# Patient Record
Sex: Female | Born: 1990 | Race: Black or African American | Hispanic: No | Marital: Single | Smoking: Former smoker
Health system: Southern US, Community
[De-identification: ages and names within clinical notes are randomized; demographics above are authoritative.]

## PROBLEM LIST (undated history)

## (undated) ENCOUNTER — Inpatient Hospital Stay (HOSPITAL_COMMUNITY): Payer: Self-pay

## (undated) DIAGNOSIS — F329 Major depressive disorder, single episode, unspecified: Secondary | ICD-10-CM

## (undated) DIAGNOSIS — N83209 Unspecified ovarian cyst, unspecified side: Secondary | ICD-10-CM

## (undated) DIAGNOSIS — I499 Cardiac arrhythmia, unspecified: Secondary | ICD-10-CM

## (undated) DIAGNOSIS — O039 Complete or unspecified spontaneous abortion without complication: Secondary | ICD-10-CM

## (undated) DIAGNOSIS — N390 Urinary tract infection, site not specified: Secondary | ICD-10-CM

## (undated) DIAGNOSIS — F319 Bipolar disorder, unspecified: Secondary | ICD-10-CM

## (undated) DIAGNOSIS — Z8619 Personal history of other infectious and parasitic diseases: Secondary | ICD-10-CM

## (undated) DIAGNOSIS — IMO0001 Reserved for inherently not codable concepts without codable children: Secondary | ICD-10-CM

## (undated) DIAGNOSIS — Q2112 Patent foramen ovale: Secondary | ICD-10-CM

## (undated) DIAGNOSIS — I712 Thoracic aortic aneurysm, without rupture, unspecified: Secondary | ICD-10-CM

## (undated) DIAGNOSIS — F32A Depression, unspecified: Secondary | ICD-10-CM

## (undated) DIAGNOSIS — Q211 Atrial septal defect: Secondary | ICD-10-CM

## (undated) HISTORY — DX: Personal history of other infectious and parasitic diseases: Z86.19

## (undated) HISTORY — PX: INDUCED ABORTION: SHX677

## (undated) HISTORY — DX: Unspecified ovarian cyst, unspecified side: N83.209

## (undated) HISTORY — PX: NO PAST SURGERIES: SHX2092

## (undated) HISTORY — PX: CARDIAC SURGERY: SHX584

---
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
## 2009-01-25 ENCOUNTER — Emergency Department (HOSPITAL_COMMUNITY): Admission: EM | Admit: 2009-01-25 | Discharge: 2009-01-25 | Payer: Self-pay | Admitting: Emergency Medicine

## 2009-05-27 ENCOUNTER — Emergency Department (HOSPITAL_COMMUNITY): Admission: EM | Admit: 2009-05-27 | Discharge: 2009-05-28 | Payer: Self-pay | Admitting: Emergency Medicine

## 2010-04-18 ENCOUNTER — Emergency Department (HOSPITAL_COMMUNITY): Admission: EM | Admit: 2010-04-18 | Discharge: 2010-04-18 | Payer: Self-pay | Admitting: Emergency Medicine

## 2010-11-06 LAB — URINALYSIS, ROUTINE W REFLEX MICROSCOPIC
Bilirubin Urine: NEGATIVE
Glucose, UA: NEGATIVE mg/dL
Nitrite: NEGATIVE
Protein, ur: NEGATIVE mg/dL
Specific Gravity, Urine: 1.029 (ref 1.005–1.030)
Urobilinogen, UA: 1 mg/dL (ref 0.0–1.0)
pH: 6 (ref 5.0–8.0)

## 2010-11-06 LAB — URINE MICROSCOPIC-ADD ON

## 2010-11-06 LAB — POCT PREGNANCY, URINE: Preg Test, Ur: NEGATIVE

## 2010-11-22 ENCOUNTER — Emergency Department (HOSPITAL_COMMUNITY)
Admission: EM | Admit: 2010-11-22 | Discharge: 2010-11-22 | Disposition: A | Discharge status: Home / Self Care | Payer: PPO | Attending: Emergency Medicine | Admitting: Emergency Medicine

## 2010-11-22 DIAGNOSIS — N390 Urinary tract infection, site not specified: Secondary | ICD-10-CM | POA: Insufficient documentation

## 2010-11-22 DIAGNOSIS — R300 Dysuria: Secondary | ICD-10-CM | POA: Insufficient documentation

## 2010-11-22 LAB — URINALYSIS, ROUTINE W REFLEX MICROSCOPIC
Bilirubin Urine: NEGATIVE
Glucose, UA: NEGATIVE mg/dL
Ketones, ur: NEGATIVE mg/dL
Leukocytes, UA: NEGATIVE
Nitrite: NEGATIVE
Protein, ur: NEGATIVE mg/dL
Specific Gravity, Urine: 1.024 (ref 1.005–1.030)
Urobilinogen, UA: 0.2 mg/dL (ref 0.0–1.0)
pH: 6.5 (ref 5.0–8.0)

## 2010-11-22 LAB — URINE MICROSCOPIC-ADD ON

## 2010-11-22 LAB — POCT PREGNANCY, URINE: Preg Test, Ur: NEGATIVE

## 2010-11-23 LAB — URINE CULTURE
Colony Count: 35000
Culture  Setup Time: 201204011147

## 2010-11-26 LAB — URINALYSIS, ROUTINE W REFLEX MICROSCOPIC
Bilirubin Urine: NEGATIVE
Glucose, UA: NEGATIVE mg/dL
Hgb urine dipstick: NEGATIVE
Nitrite: NEGATIVE
Protein, ur: NEGATIVE mg/dL
Specific Gravity, Urine: 1.027 (ref 1.005–1.030)
Urobilinogen, UA: 0.2 mg/dL (ref 0.0–1.0)
pH: 5.5 (ref 5.0–8.0)

## 2010-11-26 LAB — BASIC METABOLIC PANEL
BUN: 6 mg/dL (ref 6–23)
CO2: 23 mEq/L (ref 19–32)
Calcium: 8.9 mg/dL (ref 8.4–10.5)
Chloride: 104 mEq/L (ref 96–112)
Creatinine, Ser: 0.64 mg/dL (ref 0.4–1.2)
GFR calc Af Amer: >60 mL/min (ref >60)
GFR calc non Af Amer: >60 mL/min (ref >60)
Glucose, Bld: 92 mg/dL (ref 70–99)
Potassium: 3.3 mEq/L — ABNORMAL LOW (ref 3.5–5.1)
Sodium: 133 mEq/L — ABNORMAL LOW (ref 135–145)

## 2010-11-26 LAB — DIFFERENTIAL
Basophils Absolute: 0 10*3/uL (ref 0.0–0.1)
Basophils Relative: 0 % (ref 0–1)
Eosinophils Absolute: 0 10*3/uL (ref 0.0–0.7)
Eosinophils Relative: 0 % (ref 0–5)
Lymphocytes Relative: 26 % (ref 12–46)
Lymphs Abs: 1.7 10*3/uL (ref 0.7–4.0)
Monocytes Absolute: 0.4 10*3/uL (ref 0.1–1.0)
Monocytes Relative: 7 % (ref 3–12)
Neutro Abs: 4.3 10*3/uL (ref 1.7–7.7)
Neutrophils Relative %: 67 % (ref 43–77)

## 2010-11-26 LAB — URINE MICROSCOPIC-ADD ON

## 2010-11-26 LAB — POCT PREGNANCY, URINE: Preg Test, Ur: NEGATIVE

## 2010-11-26 LAB — CBC
HCT: 43.3 % (ref 36.0–46.0)
Hemoglobin: 14.6 g/dL (ref 12.0–15.0)
MCHC: 33.6 g/dL (ref 30.0–36.0)
MCV: 89.5 fL (ref 78.0–100.0)
Platelets: 262 10*3/uL (ref 150–400)
RBC: 4.84 MIL/uL (ref 3.87–5.11)
RDW: 14 % (ref 11.5–15.5)
WBC: 6.4 10*3/uL (ref 4.0–10.5)

## 2010-11-26 LAB — GC/CHLAMYDIA PROBE AMP, GENITAL
Chlamydia, DNA Probe: NEGATIVE
GC Probe Amp, Genital: NEGATIVE

## 2010-11-26 LAB — WET PREP, GENITAL
Clue Cells Wet Prep HPF POC: NONE SEEN
Trich, Wet Prep: NONE SEEN
WBC, Wet Prep HPF POC: NONE SEEN
Yeast Wet Prep HPF POC: NONE SEEN

## 2010-11-30 LAB — DIFFERENTIAL
Basophils Absolute: 0.1 10*3/uL (ref 0.0–0.1)
Basophils Relative: 1 % (ref 0–1)
Eosinophils Absolute: 0.1 10*3/uL (ref 0.0–0.7)
Eosinophils Relative: 1 % (ref 0–5)
Lymphocytes Relative: 43 % (ref 12–46)
Lymphs Abs: 3.1 10*3/uL (ref 0.7–4.0)
Monocytes Absolute: 0.6 10*3/uL (ref 0.1–1.0)
Monocytes Relative: 8 % (ref 3–12)
Neutro Abs: 3.4 10*3/uL (ref 1.7–7.7)
Neutrophils Relative %: 47 % (ref 43–77)

## 2010-11-30 LAB — BASIC METABOLIC PANEL
BUN: 10 mg/dL (ref 6–23)
CO2: 27 mEq/L (ref 19–32)
Calcium: 9.7 mg/dL (ref 8.4–10.5)
Chloride: 107 mEq/L (ref 96–112)
Creatinine, Ser: 0.63 mg/dL (ref 0.4–1.2)
GFR calc Af Amer: >60 mL/min (ref >60)
GFR calc non Af Amer: >60 mL/min (ref >60)
Glucose, Bld: 89 mg/dL (ref 70–99)
Potassium: 4.1 mEq/L (ref 3.5–5.1)
Sodium: 141 mEq/L (ref 135–145)

## 2010-11-30 LAB — POCT CARDIAC MARKERS
CKMB, poc: <1 ng/mL — ABNORMAL LOW (ref 1.0–8.0)
Myoglobin, poc: 45.5 ng/mL (ref 12–200)
Troponin i, poc: <0.05 ng/mL (ref 0.00–0.09)

## 2010-11-30 LAB — CBC
HCT: 41.9 % (ref 36.0–46.0)
Hemoglobin: 13.8 g/dL (ref 12.0–15.0)
MCHC: 33 g/dL (ref 30.0–36.0)
MCV: 88.8 fL (ref 78.0–100.0)
Platelets: 312 10*3/uL (ref 150–400)
RBC: 4.72 MIL/uL (ref 3.87–5.11)
RDW: 14.1 % (ref 11.5–15.5)
WBC: 7.1 10*3/uL (ref 4.0–10.5)

## 2011-01-08 ENCOUNTER — Emergency Department (HOSPITAL_COMMUNITY)
Admission: EM | Admit: 2011-01-08 | Discharge: 2011-01-08 | Disposition: A | Discharge status: Home / Self Care | Payer: PPO | Attending: Emergency Medicine | Admitting: Emergency Medicine

## 2011-01-08 ENCOUNTER — Emergency Department (HOSPITAL_COMMUNITY): Payer: PPO

## 2011-01-08 DIAGNOSIS — R000 Tachycardia, unspecified: Secondary | ICD-10-CM | POA: Insufficient documentation

## 2011-01-08 DIAGNOSIS — N83209 Unspecified ovarian cyst, unspecified side: Secondary | ICD-10-CM | POA: Insufficient documentation

## 2011-01-08 DIAGNOSIS — R109 Unspecified abdominal pain: Secondary | ICD-10-CM | POA: Insufficient documentation

## 2011-01-08 DIAGNOSIS — R509 Fever, unspecified: Secondary | ICD-10-CM | POA: Insufficient documentation

## 2011-01-08 DIAGNOSIS — R10815 Periumbilic abdominal tenderness: Secondary | ICD-10-CM | POA: Insufficient documentation

## 2011-01-08 LAB — CBC
HCT: 45.7 % (ref 36.0–46.0)
MCH: 29.8 pg (ref 26.0–34.0)
MCHC: 34.4 g/dL (ref 30.0–36.0)
MCV: 86.9 fL (ref 78.0–100.0)
Platelets: 249 10*3/uL (ref 150–400)
RDW: 13.2 % (ref 11.5–15.5)
WBC: 9.9 10*3/uL (ref 4.0–10.5)

## 2011-01-08 LAB — DIFFERENTIAL
Eosinophils Absolute: 0 10*3/uL (ref 0.0–0.7)
Eosinophils Relative: 0 % (ref 0–5)
Lymphocytes Relative: 23 % (ref 12–46)
Lymphs Abs: 2.3 10*3/uL (ref 0.7–4.0)
Monocytes Absolute: 0.9 10*3/uL (ref 0.1–1.0)

## 2011-01-08 LAB — URINALYSIS, ROUTINE W REFLEX MICROSCOPIC
Glucose, UA: NEGATIVE mg/dL
Ketones, ur: 15 mg/dL — AB
Nitrite: NEGATIVE
Specific Gravity, Urine: 1.025 (ref 1.005–1.030)
pH: 6 (ref 5.0–8.0)

## 2011-01-08 LAB — COMPREHENSIVE METABOLIC PANEL
Albumin: 4.6 g/dL (ref 3.5–5.2)
Alkaline Phosphatase: 62 U/L (ref 39–117)
BUN: 8 mg/dL (ref 6–23)
Calcium: 9.8 mg/dL (ref 8.4–10.5)
Creatinine, Ser: 0.58 mg/dL (ref 0.4–1.2)
Glucose, Bld: 84 mg/dL (ref 70–99)
Potassium: 3.6 mEq/L (ref 3.5–5.1)
Total Protein: 8.2 g/dL (ref 6.0–8.3)

## 2011-01-08 LAB — POCT PREGNANCY, URINE: Preg Test, Ur: NEGATIVE

## 2011-01-08 LAB — LIPASE, BLOOD: Lipase: 24 U/L (ref 11–59)

## 2011-01-08 LAB — OCCULT BLOOD, POC DEVICE: Fecal Occult Bld: NEGATIVE

## 2011-01-08 MED ORDER — IOHEXOL 300 MG/ML  SOLN
100.0000 mL | Freq: Once | INTRAMUSCULAR | Status: AC | PRN
  Administered 2011-01-08: 100 mL via INTRAVENOUS

## 2011-07-06 ENCOUNTER — Emergency Department (HOSPITAL_COMMUNITY)
Admission: EM | Admit: 2011-07-06 | Discharge: 2011-07-06 | Disposition: A | Discharge status: Home / Self Care | Payer: Self-pay | Attending: Emergency Medicine | Admitting: Emergency Medicine

## 2011-07-06 ENCOUNTER — Emergency Department (HOSPITAL_COMMUNITY): Payer: Self-pay

## 2011-07-06 ENCOUNTER — Encounter: Payer: Self-pay | Admitting: Emergency Medicine

## 2011-07-06 ENCOUNTER — Other Ambulatory Visit: Payer: Self-pay

## 2011-07-06 DIAGNOSIS — I499 Cardiac arrhythmia, unspecified: Secondary | ICD-10-CM

## 2011-07-06 DIAGNOSIS — R0602 Shortness of breath: Secondary | ICD-10-CM | POA: Insufficient documentation

## 2011-07-06 DIAGNOSIS — R079 Chest pain, unspecified: Secondary | ICD-10-CM | POA: Insufficient documentation

## 2011-07-06 DIAGNOSIS — J45990 Exercise induced bronchospasm: Secondary | ICD-10-CM | POA: Insufficient documentation

## 2011-07-06 HISTORY — DX: Cardiac arrhythmia, unspecified: I49.9

## 2011-07-06 MED ORDER — ALBUTEROL 90 MCG/ACT IN AERS: 2.0000 | INHALATION_SPRAY | Freq: Four times a day (QID) | RESPIRATORY_TRACT | Status: DC | PRN

## 2011-07-06 NOTE — ED Notes (Signed)
Pt returned from xray. NAD noted at this time. 

## 2011-07-06 NOTE — ED Provider Notes (Signed)
History     CSN: [id number] Arrival date & time: 07/06/2011 11:13 AM   First MD Initiated Contact with Patient 07/06/11 1147      Chief Complaint  Patient presents with  . Shortness of Breath    (Consider location/radiation/quality/duration/timing/severity/associated sxs/prior treatment) Patient is a 20 y.o. female presenting with shortness of breath. The history is provided by the patient.  Shortness of Breath  The current episode started 3 to 5 days ago. The onset was sudden. The problem occurs occasionally. The problem has been unchanged. The symptoms are relieved by rest. The symptoms are aggravated by activity. Associated symptoms include chest pain and shortness of breath. Pertinent negatives include no chest pressure, no orthopnea, no fever, no rhinorrhea, no sore throat, no stridor, no cough and no wheezing. Her past medical history does not include asthma, bronchiolitis, past wheezing or asthma in the family. She has been behaving normally. Urine output has been normal. There were no sick contacts.    Past Medical History  Diagnosis Date  . Irregular heart beat 07/06/11    History reviewed. No pertinent past surgical history.  No family history on file.  History  Substance Use Topics  . Smoking status: Not on file  . Smokeless tobacco: Not on file  . Alcohol Use: No    OB History    Grav Para Term Preterm Abortions TAB SAB Ect Mult Living                  Review of Systems  Constitutional: Negative for fever, chills, diaphoresis, activity change, appetite change, fatigue and unexpected weight change.  HENT: Negative for sore throat and rhinorrhea.   Respiratory: Positive for shortness of breath. Negative for apnea, cough, choking, chest tightness, wheezing and stridor.   Cardiovascular: Positive for chest pain. Negative for palpitations, orthopnea and leg swelling.  Gastrointestinal: Negative for nausea, vomiting, abdominal pain, diarrhea, constipation and  abdominal distention.  Musculoskeletal: Negative.   Skin: Negative.   Neurological: Negative.   Hematological: Negative.   Psychiatric/Behavioral: Negative.   All other systems reviewed and are negative.    Allergies  Food  Home Medications   Current Outpatient Rx  Name Route Sig Dispense Refill  . ASPIRIN BUFFERED 325 MG PO TABS Oral Take 650 mg by mouth daily.        BP 111/78  Pulse 72  Temp(Src) 99 F (37.2 C) (Oral)  Resp 18  Wt 162 lb (73.483 kg)  SpO2 100%  LMP 06/17/2011  Physical Exam  Constitutional: She is oriented to person, place, and time. She appears well-developed and well-nourished. No distress.  HENT:  Head: Normocephalic and atraumatic.  Neck: Normal range of motion. Neck supple.  Cardiovascular: Normal rate, regular rhythm and intact distal pulses.        No murmur heard.  Pulmonary/Chest: Effort normal and breath sounds normal. No respiratory distress.  Abdominal: Soft. Bowel sounds are normal. She exhibits no distension. There is no tenderness. There is no rebound.  Musculoskeletal: Normal range of motion.  Neurological: She is alert and oriented to person, place, and time. No cranial nerve deficit.  Skin: She is not diaphoretic.    ED Course  Procedures (including critical care time)  Labs Reviewed - No data to display Dg Chest 2 View  07/06/2011  *RADIOLOGY REPORT*  Clinical Data: Left-sided chest pain and shortness of breath  CHEST - 2 VIEW  Comparison: 01/25/2009  Findings: The heart and pulmonary vascularity are within normal limits.  The lungs  are free of acute infiltrate or sizable effusion.  No acute bony abnormality is noted.  IMPRESSION: No acute intrathoracic abnormality.  No change from prior exam.  Original Report Authenticated By: Phillips Odor, M.D.     1. Exercise-induced asthma       MDM  Pt seen with new onset SOB with exercise. PERC negative for PE. No recent travel or risk factors. Possibly due to exercise induced  asthma. Will give albuterol inhaler prescription and check EKG for chest pain. Would recommend that pt follow up with cardiologist as previously recommended on work up of chest pain.    1:17 PM EKG examined with attending physician and deemed normal. Discussed with pt the results of her EKG and CXR and talked to her about exercise induced asthma and will give Rx for albuterol inhaler and instructed her on use and using 10 minutes prior to activity to see if her symptoms are alleviated. Also informed if symptoms worsen or do not improve she should call a clinic for appointment or return to the ED. She states that she is going to see the doctor that her grandmother sees and I reinforced that it would be a good idea to have a doctor.      Genella Mech, MD 07/06/11 1352

## 2011-07-06 NOTE — ED Notes (Signed)
States that she has CP a lot and usually when she comes she is told that she has palpitations. States that she has been diagnosed with a heart mummur

## 2011-07-08 NOTE — ED Provider Notes (Signed)
Medical screening examination/treatment/procedure(s) were performed by non-physician practitioner and as supervising physician I was immediately available for consultation/collaboration.  Toy Baker, MD 07/08/11 [phone]

## 2011-10-22 ENCOUNTER — Other Ambulatory Visit: Payer: Self-pay

## 2011-10-22 ENCOUNTER — Encounter (HOSPITAL_COMMUNITY): Payer: Self-pay | Admitting: *Deleted

## 2011-10-22 ENCOUNTER — Emergency Department (HOSPITAL_COMMUNITY): Payer: Self-pay

## 2011-10-22 ENCOUNTER — Emergency Department (HOSPITAL_COMMUNITY)
Admission: EM | Admit: 2011-10-22 | Discharge: 2011-10-22 | Disposition: A | Discharge status: Home / Self Care | Payer: Self-pay | Attending: Emergency Medicine | Admitting: Emergency Medicine

## 2011-10-22 DIAGNOSIS — Z7982 Long term (current) use of aspirin: Secondary | ICD-10-CM | POA: Insufficient documentation

## 2011-10-22 DIAGNOSIS — F17200 Nicotine dependence, unspecified, uncomplicated: Secondary | ICD-10-CM | POA: Insufficient documentation

## 2011-10-22 DIAGNOSIS — R209 Unspecified disturbances of skin sensation: Secondary | ICD-10-CM | POA: Insufficient documentation

## 2011-10-22 DIAGNOSIS — R071 Chest pain on breathing: Secondary | ICD-10-CM | POA: Insufficient documentation

## 2011-10-22 DIAGNOSIS — R0789 Other chest pain: Secondary | ICD-10-CM | POA: Insufficient documentation

## 2011-10-22 MED ORDER — NAPROXEN 500 MG PO TABS: 500.0000 mg | ORAL_TABLET | Freq: Two times a day (BID) | ORAL | Status: DC

## 2011-10-22 MED ORDER — CYCLOBENZAPRINE HCL 10 MG PO TABS: 10.0000 mg | ORAL_TABLET | Freq: Two times a day (BID) | ORAL | Status: AC | PRN

## 2011-10-22 MED ORDER — IBUPROFEN 800 MG PO TABS
800.0000 mg | ORAL_TABLET | Freq: Once | ORAL | Status: DC
  Filled 2011-10-22: qty 1

## 2011-10-22 NOTE — ED Provider Notes (Signed)
History     CSN: [id number]  Arrival date & time 10/22/11  1559   First MD Initiated Contact with Patient 10/22/11 1713      Chief Complaint  Patient presents with  . Chest Pain    (Consider location/radiation/quality/duration/timing/severity/associated sxs/prior treatment) HPI Comments: Patient has had right-sided chest pain for the past several days it radiates into her right arm and right upper back. Today she developed tingling and numbness in her right arm. No weakness, cough, fever, shortness of breath. The pain is reproducible to palpation on her chest wall. No rashes. She's not on birth control. Taking aspirin at home. She denies any injury to her arm. 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  The history is provided by the patient.    Past Medical History  Diagnosis Date  . Irregular heart beat 07/06/11    History reviewed. No pertinent past surgical history.  History reviewed. No pertinent family history.  History  Substance Use Topics  . Smoking status: Current Everyday Smoker  . Smokeless tobacco: Not on file  . Alcohol Use: No    OB History    Grav Para Term Preterm Abortions TAB SAB Ect Mult Living                  Review of Systems  Constitutional: Negative for fever, activity change and appetite change.  HENT: Negative for congestion and rhinorrhea.   Respiratory: Positive for chest tightness. Negative for cough and shortness of breath.   Cardiovascular: Positive for chest pain.  Gastrointestinal: Negative for nausea, vomiting and abdominal pain.  Genitourinary: Negative for dysuria and hematuria.  Musculoskeletal: Positive for arthralgias. Negative for back pain.  Skin: Negative for rash.  Neurological: Negative for headaches.    Allergies  Food  Home Medications   Current Outpatient Rx  Name Route Sig Dispense Refill  . ASPIRIN 81 MG PO CHEW Oral Chew 81 mg by mouth daily.    . ALBUTEROL 90 MCG/ACT IN AERS Inhalation Inhale 2 puffs  into the lungs every 6 (six) hours as needed for wheezing. 17 g 0  . CYCLOBENZAPRINE HCL 10 MG PO TABS Oral Take 1 tablet (10 mg total) by mouth 2 (two) times daily as needed for muscle spasms. 20 tablet 0  . NAPROXEN 500 MG PO TABS Oral Take 1 tablet (500 mg total) by mouth 2 (two) times daily. 30 tablet 0    BP 116/79  Pulse 90  Temp(Src) 99.1 F (37.3 C) (Oral)  Resp 20  SpO2 100%  LMP 10/17/2011  Physical Exam  Constitutional: She appears well-developed and well-nourished. No distress.  HENT:  Head: Normocephalic and atraumatic.  Mouth/Throat: Oropharynx is clear and moist. No oropharyngeal exudate.  Eyes: Conjunctivae are normal. Pupils are equal, round, and reactive to light.  Neck: Normal range of motion. Neck supple.       No paraspinal muscle tenderness  Cardiovascular: Normal rate, regular rhythm and normal heart sounds.   Pulmonary/Chest: Effort normal and breath sounds normal. No respiratory distress. She exhibits tenderness.       Reproducible right-sided chest wall pain, no rash  Abdominal: Soft. There is no tenderness. There is no rebound and no guarding.  Musculoskeletal: Normal range of motion. She exhibits no edema and no tenderness.  Neurological: She is alert. No cranial nerve deficit.       5 out of 5 strength throughout, equal grip strengths bilatereally.  Cardinal hand movements intact.  Capillary refill <2 sec.  Skin:  Skin is warm.    ED Course  Procedures (including critical care time)  Labs Reviewed - No data to display Dg Chest 2 View  10/22/2011  *RADIOLOGY REPORT*  Clinical Data: Right-sided chest pain and numbness  CHEST - 2 VIEW  Comparison: 07/06/2011  Findings: The heart size and mediastinal contours are within normal limits.  Both lungs are clear.  The visualized skeletal structures are unremarkable.  IMPRESSION: Negative exam.  Original Report Authenticated By: Rosealee Albee, M.D.     1. Chest wall pain       MDM  Reproducible  right-sided chest wall pain. Causing tingling and numbness in right arm. Grip strength equal. Perfusion to right upper extremity are intact with good muscle strength, sensation and pulses. PERC neg.   Date: 10/22/2011  Rate: 83  Rhythm: normal sinus rhythm  QRS Axis: normal  Intervals: normal  ST/T Wave abnormalities: normal  Conduction Disutrbances:none  Narrative Interpretation:   Old EKG Reviewed: unchanged         Glynn Octave, MD 10/22/11 2132

## 2011-10-22 NOTE — ED Notes (Signed)
Pt reports several day hx of right sided chest pain radiating into right arm. States her arm feels like it is asleep. Denies sob or nausea.

## 2012-02-04 ENCOUNTER — Encounter (HOSPITAL_COMMUNITY): Payer: Self-pay

## 2012-02-04 ENCOUNTER — Emergency Department (HOSPITAL_COMMUNITY)
Admission: EM | Admit: 2012-02-04 | Discharge: 2012-02-04 | Disposition: A | Discharge status: Home / Self Care | Payer: BC Managed Care – PPO | Attending: Emergency Medicine | Admitting: Emergency Medicine

## 2012-02-04 DIAGNOSIS — F17200 Nicotine dependence, unspecified, uncomplicated: Secondary | ICD-10-CM | POA: Insufficient documentation

## 2012-02-04 DIAGNOSIS — N390 Urinary tract infection, site not specified: Secondary | ICD-10-CM

## 2012-02-04 DIAGNOSIS — R300 Dysuria: Secondary | ICD-10-CM | POA: Insufficient documentation

## 2012-02-04 DIAGNOSIS — R350 Frequency of micturition: Secondary | ICD-10-CM | POA: Insufficient documentation

## 2012-02-04 DIAGNOSIS — Z7982 Long term (current) use of aspirin: Secondary | ICD-10-CM | POA: Insufficient documentation

## 2012-02-04 DIAGNOSIS — I499 Cardiac arrhythmia, unspecified: Secondary | ICD-10-CM | POA: Insufficient documentation

## 2012-02-04 DIAGNOSIS — Z87440 Personal history of urinary (tract) infections: Secondary | ICD-10-CM | POA: Insufficient documentation

## 2012-02-04 LAB — URINALYSIS, ROUTINE W REFLEX MICROSCOPIC
Hgb urine dipstick: NEGATIVE
Nitrite: POSITIVE — AB
Protein, ur: 30 mg/dL — AB
Specific Gravity, Urine: 1.028 (ref 1.005–1.030)
Urobilinogen, UA: 4 mg/dL — ABNORMAL HIGH (ref 0.0–1.0)

## 2012-02-04 LAB — URINE MICROSCOPIC-ADD ON

## 2012-02-04 LAB — POCT PREGNANCY, URINE: Preg Test, Ur: NEGATIVE

## 2012-02-04 MED ORDER — NITROFURANTOIN MONOHYD MACRO 100 MG PO CAPS
100.0000 mg | ORAL_CAPSULE | Freq: Once | ORAL | Status: AC
  Administered 2012-02-04: 100 mg via ORAL
  Filled 2012-02-04: qty 1

## 2012-02-04 MED ORDER — NITROFURANTOIN MONOHYD MACRO 100 MG PO CAPS: 100.0000 mg | ORAL_CAPSULE | Freq: Two times a day (BID) | ORAL | Status: AC

## 2012-02-04 NOTE — ED Notes (Signed)
Pt with hx of multiple UTIs, states she is having urgency and frequency urinating. Pt states she usually is placed on antibiotics twice a year on average for UTIs.

## 2012-02-04 NOTE — ED Provider Notes (Signed)
History     CSN: [id number]  Arrival date & time 02/04/12  4098   First MD Initiated Contact with Patient 02/04/12 [phone]      Chief Complaint  Patient presents with  . Urinary Tract Infection    (Consider location/radiation/quality/duration/timing/severity/associated sxs/prior treatment) HPI This is a 21 year old black female with a history of twice yearly urinary tract infections. She is here with urinary urgency, frequency, small voids and burning with urination that started yesterday. The symptoms are moderate. She is having low back pain. She's not having abdominal pain. She is not having flank pain. She has not had fever or chills. She has not had nausea or vomiting. She is not on her menses. She has been taking over-the-counter phenazopyridine with slight relief.  Past Medical History  Diagnosis Date  . Irregular heart beat 07/06/11    No past surgical history on file.  History reviewed. No pertinent family history.  History  Substance Use Topics  . Smoking status: Current Everyday Smoker -- 0.5 packs/day  . Smokeless tobacco: Not on file  . Alcohol Use: No    OB History    Grav Para Term Preterm Abortions TAB SAB Ect Mult Living                  Review of Systems  All other systems reviewed and are negative.    Allergies  Food  Home Medications   Current Outpatient Rx  Name Route Sig Dispense Refill  . ALBUTEROL 90 MCG/ACT IN AERS Inhalation Inhale 2 puffs into the lungs every 6 (six) hours as needed for wheezing. 17 g 0  . ASPIRIN 81 MG PO CHEW Oral Chew 81 mg by mouth daily.    Marland Kitchen NAPROXEN 500 MG PO TABS Oral Take 1 tablet (500 mg total) by mouth 2 (two) times daily. 30 tablet 0    BP 120/69  Pulse 96  Temp 98.5 F (36.9 C) (Oral)  Resp 18  Ht 5\' 8"  (1.727 m)  Wt 165 lb (74.844 kg)  BMI 25.09 kg/m2  SpO2 99%  LMP 01/17/2012  Physical Exam General: Well-developed, well-nourished female in no acute distress; appearance consistent with age of  record HENT: normocephalic, atraumatic Eyes: pupils equal round and reactive to light; extraocular muscles intact Neck: supple Heart: regular rate and rhythm Lungs: clear to auscultation bilaterally Abdomen: soft; nondistended; nontender; bowel sounds present GU: No flank tenderness Extremities: No deformity; full range of motion Neurologic: Awake, alert and oriented; motor function intact in all extremities and symmetric; no facial droop Skin: Warm and dry Psychiatric: Normal mood and affect    ED Course  Procedures (including critical care time)     MDM   Nursing notes and vitals signs, including pulse oximetry, reviewed.  Summary of this visit's results, reviewed by myself:  Labs:  Results for orders placed during the hospital encounter of 02/04/12  URINALYSIS, ROUTINE W REFLEX MICROSCOPIC      Component Value Range   Color, Urine RED (*) YELLOW   APPearance CLOUDY (*) CLEAR   Specific Gravity, Urine 1.028  1.005 - 1.030   pH 5.0  5.0 - 8.0   Glucose, UA NEGATIVE  NEGATIVE mg/dL   Hgb urine dipstick NEGATIVE  NEGATIVE   Bilirubin Urine MODERATE (*) NEGATIVE   Ketones, ur 40 (*) NEGATIVE mg/dL   Protein, ur 30 (*) NEGATIVE mg/dL   Urobilinogen, UA 4.0 (*) 0.0 - 1.0 mg/dL   Nitrite POSITIVE (*) NEGATIVE   Leukocytes, UA MODERATE (*) NEGATIVE  POCT PREGNANCY, URINE      Component Value Range   Preg Test, Ur NEGATIVE  NEGATIVE  URINE MICROSCOPIC-ADD ON      Component Value Range   Squamous Epithelial / LPF FEW (*) RARE   WBC, UA 3-6  <3 WBC/hpf   RBC / HPF 0-2  <3 RBC/hpf   Bacteria, UA RARE  RARE   We'll treat based on symptomatology. Will send urine culture.        Hanley Seamen, MD 02/04/12 [phone]

## 2012-02-04 NOTE — Discharge Instructions (Signed)

## 2012-02-05 LAB — URINE CULTURE
Colony Count: NO GROWTH
Culture  Setup Time: 201306140833
Culture: NO GROWTH

## 2012-02-08 ENCOUNTER — Encounter (HOSPITAL_COMMUNITY): Payer: Self-pay | Admitting: *Deleted

## 2012-02-08 ENCOUNTER — Emergency Department (HOSPITAL_COMMUNITY)
Admission: EM | Admit: 2012-02-08 | Discharge: 2012-02-09 | Disposition: A | Discharge status: Home / Self Care | Payer: BC Managed Care – PPO | Attending: Emergency Medicine | Admitting: Emergency Medicine

## 2012-02-08 DIAGNOSIS — Z7982 Long term (current) use of aspirin: Secondary | ICD-10-CM | POA: Insufficient documentation

## 2012-02-08 DIAGNOSIS — Z87440 Personal history of urinary (tract) infections: Secondary | ICD-10-CM | POA: Insufficient documentation

## 2012-02-08 DIAGNOSIS — M545 Low back pain, unspecified: Secondary | ICD-10-CM | POA: Insufficient documentation

## 2012-02-08 DIAGNOSIS — N390 Urinary tract infection, site not specified: Secondary | ICD-10-CM

## 2012-02-08 DIAGNOSIS — Z79899 Other long term (current) drug therapy: Secondary | ICD-10-CM | POA: Insufficient documentation

## 2012-02-08 DIAGNOSIS — R3915 Urgency of urination: Secondary | ICD-10-CM | POA: Insufficient documentation

## 2012-02-08 DIAGNOSIS — R300 Dysuria: Secondary | ICD-10-CM | POA: Insufficient documentation

## 2012-02-08 HISTORY — DX: Urinary tract infection, site not specified: N39.0

## 2012-02-08 LAB — URINALYSIS, ROUTINE W REFLEX MICROSCOPIC
Glucose, UA: NEGATIVE mg/dL
Hgb urine dipstick: NEGATIVE
Ketones, ur: NEGATIVE mg/dL
Protein, ur: NEGATIVE mg/dL
Urobilinogen, UA: 0.2 mg/dL (ref 0.0–1.0)

## 2012-02-08 LAB — URINE MICROSCOPIC-ADD ON

## 2012-02-08 NOTE — ED Notes (Signed)
Pt reports she was seen here on 02/04/12 for dysuria, pt was dx w/ uti and given prescriptions for antibiotics. Pt states she has hx of UTI and usually starts feeling better 2 days after starting antibiotics - pt states she is now feeling worse and is having increased pain w/ urination, lower back pain and rt flank pain. Pt denies any fever or n/v.

## 2012-02-09 MED ORDER — CIPROFLOXACIN HCL 500 MG PO TABS
500.0000 mg | ORAL_TABLET | Freq: Once | ORAL | Status: AC
  Administered 2012-02-09: 500 mg via ORAL
  Filled 2012-02-09: qty 1

## 2012-02-09 MED ORDER — CIPROFLOXACIN HCL 500 MG PO TABS: 500.0000 mg | ORAL_TABLET | Freq: Two times a day (BID) | ORAL | Status: AC

## 2012-02-09 NOTE — ED Provider Notes (Signed)
History     CSN: [id number]  Arrival date & time 02/08/12  1956   First MD Initiated Contact with Patient 02/08/12 2351      Chief Complaint  Patient presents with  . Dysuria    (Consider location/radiation/quality/duration/timing/severity/associated sxs/prior treatment) HPI This is a 21 year old black female who was seen by myself on June 14 for dysuria. She has a history of multiple UTIs in the past and was treated with Macrobid. She returns complaining of worsening symptoms. Specifically moderate to severe burning on urination, urgency and low back pain. She's had chills but no fever. She denies nausea vomiting. Urine culture obtained on June 14 showed no growth.   Past Medical History  Diagnosis Date  . Irregular heart beat 07/06/11  . UTI (lower urinary tract infection)     History reviewed. No pertinent past surgical history.  History reviewed. No pertinent family history.  History  Substance Use Topics  . Smoking status: Current Everyday Smoker -- 0.5 packs/day  . Smokeless tobacco: Not on file  . Alcohol Use: No    OB History    Grav Para Term Preterm Abortions TAB SAB Ect Mult Living                  Review of Systems  All other systems reviewed and are negative.    Allergies  Food  Home Medications   Current Outpatient Rx  Name Route Sig Dispense Refill  . ALBUTEROL 90 MCG/ACT IN AERS Inhalation Inhale 2 puffs into the lungs every 6 (six) hours as needed for wheezing. 17 g 0  . ASPIRIN 81 MG PO CHEW Oral Chew 81 mg by mouth daily.    Marland Kitchen NITROFURANTOIN MONOHYD MACRO 100 MG PO CAPS Oral Take 1 capsule (100 mg total) by mouth 2 (two) times daily. 14 capsule 0    BP 111/68  Pulse 86  Temp 98.4 F (36.9 C) (Oral)  Resp 16  SpO2 100%  LMP 01/17/2012  Physical Exam General: Well-developed, well-nourished female in no acute distress; appearance consistent with age of record HENT: normocephalic, atraumatic Eyes: pupils equal round and reactive to  light; extraocular muscles intact Neck: supple Heart: regular rate and rhythm Lungs: clear to auscultation bilaterally Abdomen: soft; nondistended; nontender; no masses or hepatosplenomegaly; bowel sounds present Back: Bilateral paralumbar tenderness GU: no flank tenderness Extremities: No deformity; full range of motion Neurologic: Awake, alert and oriented; motor function intact in all extremities and symmetric; no facial droop Skin: Warm and dry Psychiatric: Normal mood and affect    ED Course  Procedures (including critical care time)     MDM   Nursing notes and vitals signs, including pulse oximetry, reviewed.  Summary of this visit's results, reviewed by myself:  Labs:  Results for orders placed during the hospital encounter of 02/08/12  URINALYSIS, ROUTINE W REFLEX MICROSCOPIC      Component Value Range   Color, Urine YELLOW  YELLOW   APPearance CLOUDY (*) CLEAR   Specific Gravity, Urine 1.018  1.005 - 1.030   pH 6.5  5.0 - 8.0   Glucose, UA NEGATIVE  NEGATIVE mg/dL   Hgb urine dipstick NEGATIVE  NEGATIVE   Bilirubin Urine NEGATIVE  NEGATIVE   Ketones, ur NEGATIVE  NEGATIVE mg/dL   Protein, ur NEGATIVE  NEGATIVE mg/dL   Urobilinogen, UA 0.2  0.0 - 1.0 mg/dL   Nitrite NEGATIVE  NEGATIVE   Leukocytes, UA MODERATE (*) NEGATIVE  URINE MICROSCOPIC-ADD ON      Component Value  Range   Squamous Epithelial / LPF FEW (*) RARE   WBC, UA 11-20  <3 WBC/hpf   RBC / HPF 0-2  <3 RBC/hpf   Bacteria, UA FEW (*) RARE   We'll switch to ciprofloxacin and reculture urine.           Hanley Seamen, MD 02/09/12 [phone]

## 2012-02-09 NOTE — Discharge Instructions (Signed)

## 2012-02-10 LAB — URINE CULTURE
Colony Count: NO GROWTH
Culture  Setup Time: 201306190522

## 2012-06-15 ENCOUNTER — Emergency Department (HOSPITAL_COMMUNITY): Payer: Worker's Compensation

## 2012-06-15 ENCOUNTER — Emergency Department (HOSPITAL_COMMUNITY)
Admission: EM | Admit: 2012-06-15 | Discharge: 2012-06-15 | Disposition: A | Discharge status: Home / Self Care | Payer: Worker's Compensation | Attending: Emergency Medicine | Admitting: Emergency Medicine

## 2012-06-15 ENCOUNTER — Encounter (HOSPITAL_COMMUNITY): Payer: Self-pay | Admitting: Emergency Medicine

## 2012-06-15 DIAGNOSIS — Z79899 Other long term (current) drug therapy: Secondary | ICD-10-CM | POA: Insufficient documentation

## 2012-06-15 DIAGNOSIS — Y9301 Activity, walking, marching and hiking: Secondary | ICD-10-CM | POA: Insufficient documentation

## 2012-06-15 DIAGNOSIS — Y929 Unspecified place or not applicable: Secondary | ICD-10-CM | POA: Insufficient documentation

## 2012-06-15 DIAGNOSIS — F17200 Nicotine dependence, unspecified, uncomplicated: Secondary | ICD-10-CM | POA: Insufficient documentation

## 2012-06-15 DIAGNOSIS — W108XXA Fall (on) (from) other stairs and steps, initial encounter: Secondary | ICD-10-CM | POA: Insufficient documentation

## 2012-06-15 DIAGNOSIS — Z87448 Personal history of other diseases of urinary system: Secondary | ICD-10-CM | POA: Insufficient documentation

## 2012-06-15 DIAGNOSIS — S93402A Sprain of unspecified ligament of left ankle, initial encounter: Secondary | ICD-10-CM

## 2012-06-15 DIAGNOSIS — W19XXXA Unspecified fall, initial encounter: Secondary | ICD-10-CM

## 2012-06-15 DIAGNOSIS — S20229A Contusion of unspecified back wall of thorax, initial encounter: Secondary | ICD-10-CM | POA: Insufficient documentation

## 2012-06-15 DIAGNOSIS — S93409A Sprain of unspecified ligament of unspecified ankle, initial encounter: Secondary | ICD-10-CM | POA: Insufficient documentation

## 2012-06-15 DIAGNOSIS — Z8679 Personal history of other diseases of the circulatory system: Secondary | ICD-10-CM | POA: Insufficient documentation

## 2012-06-15 MED ORDER — NAPROXEN 500 MG PO TABS: 500.0000 mg | ORAL_TABLET | Freq: Two times a day (BID) | ORAL | Status: DC

## 2012-06-15 NOTE — ED Notes (Signed)
Waiting on Ortho Tech to apply an ASO ankle brace

## 2012-06-15 NOTE — ED Notes (Signed)
Pt was walking on stairs and turned her left ankle and fell to her left side. Pt now c/o left ankle pain, worse with movement, and left side pain.

## 2012-06-15 NOTE — ED Provider Notes (Signed)
History     CSN: [id number]  Arrival date & time 06/15/12  1407   First MD Initiated Contact with Patient 06/15/12 1504      Chief Complaint  Patient presents with  . Ankle Pain    (Consider location/radiation/quality/duration/timing/severity/associated sxs/prior treatment) HPI Comments: Veronica Knight is a 21 y.o. Female who presents with complaint of a fall. Pt states she was walking down steps, states her foot turned, twisting ankle, and she fell backwards and hit lower back on a step. States now pain in left ankle and lower back. Pt states she is able to ambulate with pain. Did not take any medications. Denies weakness or numbness in her legs. No urinary or fecal incontinence/retention. No hematuria or abdominal pain.     Past Medical History  Diagnosis Date  . Irregular heart beat 07/06/11  . UTI (lower urinary tract infection)     History reviewed. No pertinent past surgical history.  History reviewed. No pertinent family history.  History  Substance Use Topics  . Smoking status: Current Every Day Smoker -- 0.5 packs/day  . Smokeless tobacco: Not on file  . Alcohol Use: No    OB History    Grav Para Term Preterm Abortions TAB SAB Ect Mult Living                  Review of Systems  Constitutional: Negative for fever and chills.  HENT: Negative for neck pain and neck stiffness.   Respiratory: Negative.   Cardiovascular: Negative.   Genitourinary: Negative for dysuria and hematuria.  Musculoskeletal: Positive for back pain and joint swelling.  Neurological: Negative for dizziness, weakness and headaches.    Allergies  Cherry  Home Medications   Current Outpatient Rx  Name Route Sig Dispense Refill  . ALBUTEROL 90 MCG/ACT IN AERS Inhalation Inhale 2 puffs into the lungs every 6 (six) hours as needed for wheezing. 17 g 0    BP 118/77  Pulse 83  Temp 98.5 F (36.9 C) (Oral)  Resp 18  SpO2 100%  LMP 06/14/2012  Physical Exam  Nursing note  and vitals reviewed. Constitutional: She appears well-developed and well-nourished. No distress.  HENT:  Head: Normocephalic.  Eyes: Conjunctivae normal are normal.  Neck: Neck supple.  Cardiovascular: Normal rate, regular rhythm and normal heart sounds.   Pulmonary/Chest: Effort normal and breath sounds normal. She has no wheezes. She has no rales.  Musculoskeletal:       Normal appearing left ankle. Tender over the lateral malleolus. Pain with ankle plantarflexion, dorsiflexion, inversion. Tender over lumbar midline spine. No bruising, swelling, deformity. Tender over left SI joint. No pain with left straight leg raise.   Neurological:       5/5 and equal LE strength. 2+ and equal patellar reflexes bilaterally  Skin: Skin is warm and dry.  Psychiatric: She has a normal mood and affect.    ED Course  Procedures (including critical care time)  Labs Reviewed - No data to display Dg Ankle Complete Left  06/15/2012  *RADIOLOGY REPORT*  Clinical Data: Trauma 2 hours ago.  Lateral pain.  LEFT ANKLE COMPLETE - 3+ VIEW  Comparison: None.  Findings: No acute fracture or dislocation.  Base of fifth metatarsal and talar dome intact.  No significant soft tissue swelling.  IMPRESSION: No acute osseous abnormality.   Original Report Authenticated By: Consuello Bossier, M.D.        1. Left ankle sprain   2. Back contusion   3. Fall  MDM  X-rays negative. No neuro deficits. She is in no distress. Ambulatory. D/c home with anti inflammatories, follow up as needed.         Lottie Mussel, PA 06/15/12 [phone]

## 2012-06-20 NOTE — ED Provider Notes (Signed)
Medical screening examination/treatment/procedure(s) were performed by non-physician practitioner and as supervising physician I was immediately available for consultation/collaboration.  Raeford Razor, MD 06/20/12 2308

## 2012-10-30 ENCOUNTER — Ambulatory Visit: Payer: Self-pay | Admitting: Obstetrics and Gynecology

## 2013-05-04 ENCOUNTER — Encounter (HOSPITAL_COMMUNITY): Payer: Self-pay

## 2013-05-04 ENCOUNTER — Emergency Department (HOSPITAL_COMMUNITY)
Admission: EM | Admit: 2013-05-04 | Discharge: 2013-05-05 | Disposition: A | Discharge status: Home / Self Care | Payer: BC Managed Care – PPO | Attending: Emergency Medicine | Admitting: Emergency Medicine

## 2013-05-04 DIAGNOSIS — Z87440 Personal history of urinary (tract) infections: Secondary | ICD-10-CM | POA: Insufficient documentation

## 2013-05-04 DIAGNOSIS — Z8679 Personal history of other diseases of the circulatory system: Secondary | ICD-10-CM | POA: Insufficient documentation

## 2013-05-04 DIAGNOSIS — Z3202 Encounter for pregnancy test, result negative: Secondary | ICD-10-CM | POA: Insufficient documentation

## 2013-05-04 DIAGNOSIS — N766 Ulceration of vulva: Secondary | ICD-10-CM | POA: Insufficient documentation

## 2013-05-04 DIAGNOSIS — B9689 Other specified bacterial agents as the cause of diseases classified elsewhere: Secondary | ICD-10-CM

## 2013-05-04 DIAGNOSIS — N760 Acute vaginitis: Secondary | ICD-10-CM | POA: Insufficient documentation

## 2013-05-04 DIAGNOSIS — F17200 Nicotine dependence, unspecified, uncomplicated: Secondary | ICD-10-CM | POA: Insufficient documentation

## 2013-05-04 LAB — URINALYSIS, ROUTINE W REFLEX MICROSCOPIC
Nitrite: NEGATIVE
Protein, ur: NEGATIVE mg/dL
Specific Gravity, Urine: 1.033 — ABNORMAL HIGH (ref 1.005–1.030)
Urobilinogen, UA: 1 mg/dL (ref 0.0–1.0)

## 2013-05-04 LAB — POCT PREGNANCY, URINE: Preg Test, Ur: NEGATIVE

## 2013-05-04 NOTE — ED Notes (Signed)
Pt has hx of UTI.  Pt states has had same symptoms.  Pt has vaginal irritation.  Pt states no specific urinary difficulty but has had UTI in past without urinary difficulty.  No fever.  Slight abdominal pain.

## 2013-05-05 LAB — WET PREP, GENITAL

## 2013-05-05 MED ORDER — METRONIDAZOLE 500 MG PO TABS: 500.0000 mg | ORAL_TABLET | Freq: Two times a day (BID) | ORAL | Status: DC

## 2013-05-05 NOTE — ED Provider Notes (Signed)
Medical screening examination/treatment/procedure(s) were performed by non-physician practitioner and as supervising physician I was immediately available for consultation/collaboration.   Kingsley Farace, MD 05/05/13 0714 

## 2013-05-05 NOTE — ED Notes (Signed)
Patient is alert and oriented x3.  She was given DC instructions and follow up visit instructions.  Patient gave verbal understanding. She was DC ambulatory under her own power to home.  V/S stable.  He was not showing any signs of distress on DC 

## 2013-05-05 NOTE — ED Notes (Signed)
Patient refused lab draw. RN made aware. 

## 2013-05-05 NOTE — ED Notes (Signed)
Dammen PA made aware that patient refused lab work.

## 2013-05-05 NOTE — ED Provider Notes (Signed)
CSN: [id number]     Arrival date & time 05/04/13  2221 History   First MD Initiated Contact with Patient 05/05/13 0121     Chief Complaint  Patient presents with  . Vaginal Discharge   HPI   history provided by the patient. The patient is a 22 year old female who presents with complaints of vaginal irritation and discharge. Patient reports having a burning pain with this or to the skin for the past 2 days. She's also noticed slight vaginal discharge with a foul shoulder. Patient states she has not been essentially active since April. She denies having similar symptoms previously. Pain is worse with palpation or wiping with tissue. She denies any trauma or history of injuries. There has not been any bleeding. No visual changes. She is some irritation with urinating. Denies any urinary frequency or hematuria. No abdominal pain or flank pain. No fever, chills or sweats.    Past Medical History  Diagnosis Date  . Irregular heart beat 07/06/11  . UTI (lower urinary tract infection)    History reviewed. No pertinent past surgical history. History reviewed. No pertinent family history. History  Substance Use Topics  . Smoking status: Current Every Day Smoker -- 0.50 packs/day  . Smokeless tobacco: Not on file  . Alcohol Use: No   OB History   Grav Para Term Preterm Abortions TAB SAB Ect Mult Living                 Review of Systems  Constitutional: Negative for fever, chills and diaphoresis.  Gastrointestinal: Negative for nausea, vomiting, abdominal pain and diarrhea.  Genitourinary: Positive for dysuria and vaginal discharge. Negative for frequency, hematuria, flank pain and vaginal bleeding.  All other systems reviewed and are negative.    Allergies  Cherry  Home Medications   Current Outpatient Rx  Name  Route  Sig  Dispense  Refill  . Cranberry 200 MG CAPS   Oral   Take 2 capsules by mouth every morning.          BP 107/70  Pulse 84  Temp(Src) 98.6 F (37 C)  (Oral)  Resp 18  SpO2 100%  LMP 04/03/2013 Physical Exam  Nursing note and vitals reviewed. Constitutional: She is oriented to person, place, and time. She appears well-developed and well-nourished. No distress.  HENT:  Head: Normocephalic.  Cardiovascular: Normal rate and regular rhythm.   Pulmonary/Chest: Effort normal and breath sounds normal. No respiratory distress. She has no wheezes. She has no rales.  Abdominal: Soft. She exhibits no distension. There is no tenderness. There is no rebound and no guarding.  No CVA tenderness  Genitourinary:  Chaperone was present. There is a small ulceration versus seizure type lesion to the medial aspect of the lower left labia minora. No bleeding. Area is tender to touch. There is a small amount of cervical discharge. No adnexal tenderness or fullness. No masses. No CMT.  Neurological: She is alert and oriented to person, place, and time.  Skin: Skin is warm and dry. No rash noted.  Psychiatric: She has a normal mood and affect. Her behavior is normal.    ED Course  Procedures   Results for orders placed during the hospital encounter of 05/04/13  WET PREP, GENITAL      Result Value Range   Yeast Wet Prep HPF POC NONE SEEN  NONE SEEN   Trich, Wet Prep NONE SEEN  NONE SEEN   Clue Cells Wet Prep HPF POC FEW (*) NONE SEEN  WBC, Wet Prep HPF POC FEW (*) NONE SEEN  URINALYSIS, ROUTINE W REFLEX MICROSCOPIC      Result Value Range   Color, Urine YELLOW  YELLOW   APPearance CLOUDY (*) CLEAR   Specific Gravity, Urine 1.033 (*) 1.005 - 1.030   pH 5.5  5.0 - 8.0   Glucose, UA NEGATIVE  NEGATIVE mg/dL   Hgb urine dipstick NEGATIVE  NEGATIVE   Bilirubin Urine NEGATIVE  NEGATIVE   Ketones, ur NEGATIVE  NEGATIVE mg/dL   Protein, ur NEGATIVE  NEGATIVE mg/dL   Urobilinogen, UA 1.0  0.0 - 1.0 mg/dL   Nitrite NEGATIVE  NEGATIVE   Leukocytes, UA NEGATIVE  NEGATIVE  POCT PREGNANCY, URINE      Result Value Range   Preg Test, Ur NEGATIVE  NEGATIVE         MDM   1. Genital labial ulcer   2. BV (bacterial vaginosis)      Patient seen and evaluated. She is well appearing in no acute distress. UA unremarkable without signs of UTI.  Patient states she has not essentially active for the past several months. She has not had any other penetration or explanation for an injury. Prior history of herpes. There has been no vesicles or other lesions of the skin. At this time we'll plan to have patient to monitor and follow up temperature health department or [facility] for further evaluation and treatment.     Angus Seller, PA-C 05/05/13 [phone]

## 2013-05-06 LAB — GC/CHLAMYDIA PROBE AMP: CT Probe RNA: NEGATIVE

## 2013-08-13 ENCOUNTER — Emergency Department (HOSPITAL_COMMUNITY)
Admission: EM | Admit: 2013-08-13 | Discharge: 2013-08-13 | Disposition: A | Discharge status: Home / Self Care | Payer: BC Managed Care – PPO | Attending: Emergency Medicine | Admitting: Emergency Medicine

## 2013-08-13 ENCOUNTER — Encounter (HOSPITAL_COMMUNITY): Payer: Self-pay | Admitting: Emergency Medicine

## 2013-08-13 DIAGNOSIS — Z87440 Personal history of urinary (tract) infections: Secondary | ICD-10-CM | POA: Insufficient documentation

## 2013-08-13 DIAGNOSIS — S61451A Open bite of right hand, initial encounter: Secondary | ICD-10-CM

## 2013-08-13 DIAGNOSIS — Z23 Encounter for immunization: Secondary | ICD-10-CM | POA: Insufficient documentation

## 2013-08-13 DIAGNOSIS — S61409A Unspecified open wound of unspecified hand, initial encounter: Secondary | ICD-10-CM | POA: Insufficient documentation

## 2013-08-13 DIAGNOSIS — F17200 Nicotine dependence, unspecified, uncomplicated: Secondary | ICD-10-CM | POA: Insufficient documentation

## 2013-08-13 DIAGNOSIS — IMO0002 Reserved for concepts with insufficient information to code with codable children: Secondary | ICD-10-CM | POA: Insufficient documentation

## 2013-08-13 DIAGNOSIS — Z8679 Personal history of other diseases of the circulatory system: Secondary | ICD-10-CM | POA: Insufficient documentation

## 2013-08-13 MED ORDER — TETANUS-DIPHTHERIA TOXOIDS TD 5-2 LFU IM INJ
0.5000 mL | INJECTION | Freq: Once | INTRAMUSCULAR | Status: AC
  Administered 2013-08-13: 0.5 mL via INTRAMUSCULAR
  Filled 2013-08-13: qty 0.5

## 2013-08-13 MED ORDER — AMOXICILLIN-POT CLAVULANATE 875-125 MG PO TABS: 1.0000 | ORAL_TABLET | Freq: Two times a day (BID) | ORAL | Status: DC
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
## 2013-08-13 NOTE — ED Provider Notes (Signed)
Medical screening examination/treatment/procedure(s) were performed by non-physician practitioner and as supervising physician I was immediately available for consultation/collaboration.  EKG Interpretation   None         Joby Hershkowitz M Tanner Yeley, MD 08/13/13 2356 
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
## 2013-10-12 ENCOUNTER — Emergency Department (HOSPITAL_COMMUNITY)
Admission: EM | Admit: 2013-10-12 | Discharge: 2013-10-13 | Disposition: A | Discharge status: Home / Self Care | Payer: Medicaid Other | Attending: Emergency Medicine | Admitting: Emergency Medicine

## 2013-10-12 ENCOUNTER — Encounter (HOSPITAL_COMMUNITY): Payer: Self-pay | Admitting: Emergency Medicine

## 2013-10-12 DIAGNOSIS — O34599 Maternal care for other abnormalities of gravid uterus, unspecified trimester: Secondary | ICD-10-CM | POA: Insufficient documentation

## 2013-10-12 DIAGNOSIS — Z79899 Other long term (current) drug therapy: Secondary | ICD-10-CM | POA: Insufficient documentation

## 2013-10-12 DIAGNOSIS — O219 Vomiting of pregnancy, unspecified: Secondary | ICD-10-CM

## 2013-10-12 DIAGNOSIS — Z3490 Encounter for supervision of normal pregnancy, unspecified, unspecified trimester: Secondary | ICD-10-CM

## 2013-10-12 DIAGNOSIS — Q2111 Secundum atrial septal defect: Secondary | ICD-10-CM | POA: Insufficient documentation

## 2013-10-12 DIAGNOSIS — N83209 Unspecified ovarian cyst, unspecified side: Secondary | ICD-10-CM

## 2013-10-12 DIAGNOSIS — N898 Other specified noninflammatory disorders of vagina: Secondary | ICD-10-CM | POA: Insufficient documentation

## 2013-10-12 DIAGNOSIS — Z87440 Personal history of urinary (tract) infections: Secondary | ICD-10-CM | POA: Insufficient documentation

## 2013-10-12 DIAGNOSIS — O9989 Other specified diseases and conditions complicating pregnancy, childbirth and the puerperium: Secondary | ICD-10-CM | POA: Insufficient documentation

## 2013-10-12 DIAGNOSIS — Q211 Atrial septal defect: Secondary | ICD-10-CM | POA: Insufficient documentation

## 2013-10-12 DIAGNOSIS — R011 Cardiac murmur, unspecified: Secondary | ICD-10-CM | POA: Insufficient documentation

## 2013-10-12 DIAGNOSIS — O210 Mild hyperemesis gravidarum: Secondary | ICD-10-CM | POA: Insufficient documentation

## 2013-10-12 DIAGNOSIS — O99330 Smoking (tobacco) complicating pregnancy, unspecified trimester: Secondary | ICD-10-CM | POA: Insufficient documentation

## 2013-10-12 HISTORY — DX: Patent foramen ovale: Q21.12

## 2013-10-12 HISTORY — DX: Atrial septal defect: Q21.1

## 2013-10-12 NOTE — ED Notes (Signed)
Pt reports intermittent L LQ abdominal pain x 3 days with n/v, and a white vaginal discharge. Pt denies any other GI or GU complaints.

## 2013-10-13 ENCOUNTER — Emergency Department (HOSPITAL_COMMUNITY): Payer: Medicaid Other

## 2013-10-13 LAB — BASIC METABOLIC PANEL
BUN: 8 mg/dL (ref 6–23)
CALCIUM: 10.1 mg/dL (ref 8.4–10.5)
CHLORIDE: 93 meq/L — AB (ref 96–112)
CO2: 24 mEq/L (ref 19–32)
CREATININE: 0.61 mg/dL (ref 0.50–1.10)
Glucose, Bld: 82 mg/dL (ref 70–99)
Potassium: 3.7 mEq/L (ref 3.7–5.3)
Sodium: 134 mEq/L — ABNORMAL LOW (ref 137–147)

## 2013-10-13 LAB — URINE MICROSCOPIC-ADD ON

## 2013-10-13 LAB — CBC WITH DIFFERENTIAL/PLATELET
BASOS ABS: 0 10*3/uL (ref 0.0–0.1)
BASOS PCT: 0 % (ref 0–1)
EOS ABS: 0.1 10*3/uL (ref 0.0–0.7)
EOS PCT: 1 % (ref 0–5)
HCT: 44.6 % (ref 36.0–46.0)
Hemoglobin: 16 g/dL — ABNORMAL HIGH (ref 12.0–15.0)
Lymphocytes Relative: 35 % (ref 12–46)
Lymphs Abs: 2.8 10*3/uL (ref 0.7–4.0)
MCH: 31.6 pg (ref 26.0–34.0)
MCHC: 35.9 g/dL (ref 30.0–36.0)
MCV: 88.1 fL (ref 78.0–100.0)
MONO ABS: 1.1 10*3/uL — AB (ref 0.1–1.0)
MONOS PCT: 13 % — AB (ref 3–12)
NEUTROS ABS: 4 10*3/uL (ref 1.7–7.7)
Neutrophils Relative %: 51 % (ref 43–77)
Platelets: 296 10*3/uL (ref 150–400)
RBC: 5.06 MIL/uL (ref 3.87–5.11)
RDW: 13.9 % (ref 11.5–15.5)
WBC: 7.8 10*3/uL (ref 4.0–10.5)

## 2013-10-13 LAB — HIV ANTIBODY (ROUTINE TESTING W REFLEX): HIV: NONREACTIVE

## 2013-10-13 LAB — URINALYSIS, ROUTINE W REFLEX MICROSCOPIC
BILIRUBIN URINE: NEGATIVE
Glucose, UA: NEGATIVE mg/dL
HGB URINE DIPSTICK: NEGATIVE
Nitrite: NEGATIVE
Protein, ur: 30 mg/dL — AB
SPECIFIC GRAVITY, URINE: 1.03 (ref 1.005–1.030)
UROBILINOGEN UA: 0.2 mg/dL (ref 0.0–1.0)
pH: 6 (ref 5.0–8.0)

## 2013-10-13 LAB — RPR: RPR Ser Ql: NONREACTIVE

## 2013-10-13 LAB — HCG, QUANTITATIVE, PREGNANCY: HCG, BETA CHAIN, QUANT, S: 22800 m[IU]/mL — AB (ref <5)

## 2013-10-13 LAB — WET PREP, GENITAL
CLUE CELLS WET PREP: NONE SEEN
TRICH WET PREP: NONE SEEN
WBC WET PREP: NONE SEEN
YEAST WET PREP: NONE SEEN

## 2013-10-13 LAB — PREGNANCY, URINE: Preg Test, Ur: POSITIVE — AB

## 2013-10-13 LAB — ABO/RH: ABO/RH(D): O POS

## 2013-10-13 MED ORDER — PRENATAL COMPLETE 14-0.4 MG PO TABS: 1.0000 | ORAL_TABLET | Freq: Every day | ORAL | Status: DC

## 2013-10-13 MED ORDER — ONDANSETRON 8 MG PO TBDP: ORAL_TABLET | ORAL | Status: DC

## 2013-10-13 NOTE — ED Notes (Signed)
PA at bedside.

## 2013-10-13 NOTE — ED Provider Notes (Signed)
CSN: [id number]     Arrival date & time 10/12/13  2336 History   First MD Initiated Contact with Patient 10/13/13 0226     Chief Complaint  Patient presents with  . Abdominal Pain  . Vaginal Discharge   HPI  History provided by the patient. Patient is a 23 year old female who presents with complaints of white vaginal discharge and left pelvic and abdominal pain and discomfort. Patient states she first began having left abdominal and side pain for the past 3 days. This was associated with some thick white vaginal discharge which she thought could possibly be a yeast infection. Today her symptoms were associated with several episodes of nausea vomiting anytime she tried to eat. Patient denies having similar symptoms previously. Denies any associated diarrhea symptoms. No fever, chills or sweats. Her last normal menstrual cycle was January 10. She did report having a slight small amount of spotting earlier this month which was abnormal for her. She reports usually having 5 days of bleeding and is very irregular with a 28 day cycle. She is sexually active occasionally using condoms. No prior history of STDs. She has only one partner. Denies any other aggravating or alleviating factors. No other associated symptoms.    Past Medical History  Diagnosis Date  . Irregular heart beat 07/06/11  . UTI (lower urinary tract infection)   . Heart murmur   . PFO (patent foramen ovale)    History reviewed. No pertinent past surgical history. History reviewed. No pertinent family history. History  Substance Use Topics  . Smoking status: Current Every Day Smoker -- 0.25 packs/day    Types: Cigarettes  . Smokeless tobacco: Never Used  . Alcohol Use: Yes     Comment: occ.   OB History   Grav Para Term Preterm Abortions TAB SAB Ect Mult Living                 Review of Systems  Constitutional: Negative for fever and diaphoresis.  Gastrointestinal: Positive for nausea, vomiting and abdominal pain.  Negative for diarrhea and constipation.  Genitourinary: Positive for vaginal discharge. Negative for dysuria, frequency, hematuria, flank pain, vaginal bleeding and menstrual problem.  All other systems reviewed and are negative.      Allergies  Cherry  Home Medications   Current Outpatient Rx  Name  Route  Sig  Dispense  Refill  . albuterol (PROVENTIL HFA;VENTOLIN HFA) 108 (90 BASE) MCG/ACT inhaler   Inhalation   Inhale 2 puffs into the lungs every 6 (six) hours as needed for wheezing or shortness of breath.          BP 129/83  Pulse 72  Temp(Src) 98.6 F (37 C) (Oral)  Resp 16  Ht 5\' 8"  (1.727 m)  Wt 147 lb (66.679 kg)  BMI 22.36 kg/m2  SpO2 100%  LMP 09/03/2013 Physical Exam  Nursing note and vitals reviewed. Constitutional: She is oriented to person, place, and time. She appears well-developed and well-nourished. No distress.  HENT:  Head: Normocephalic.  Cardiovascular: Normal rate and regular rhythm.   Pulmonary/Chest: Effort normal and breath sounds normal. No respiratory distress. She has no wheezes. She has no rales.  Abdominal: Soft. There is tenderness in the suprapubic area. There is no rigidity, no rebound, no guarding, no CVA tenderness, no tenderness at McBurney's point and negative Murphy's sign.  Genitourinary:  Chaperone was present. Large amounts of thick white vaginal discharge. No blood. No products of conception. Cervix closed. No adnexal tenderness or fullness.  Musculoskeletal:  Normal range of motion.  Neurological: She is alert and oriented to person, place, and time.  Skin: Skin is warm and dry. No rash noted.  Psychiatric: She has a normal mood and affect. Her behavior is normal.    ED Course  Procedures  DIAGNOSTIC STUDIES: Oxygen Saturation is 100% on room air.    COORDINATION OF CARE:  Nursing notes reviewed. Vital signs reviewed. Initial pt interview and examination performed.   2:30 AM patient seen and evaluated. Patient  appears well in no acute distress. Does not appear in severe pain or discomfort. Discussed work up plan with pt at bedside, which includes lab testing an ultrasound test. Pt agrees with plan.  Patient tolerating by mouth fluids. I have discussed laboratory findings and ultrasound findings. No signs for ectopic pregnancy. Patient does have left ovarian cyst. Discussed the need for OB/GYN followup. She agrees with this plan.   Results for orders placed during the hospital encounter of 10/12/13  WET PREP, GENITAL      Result Value Ref Range   Yeast Wet Prep HPF POC NONE SEEN  NONE SEEN   Trich, Wet Prep NONE SEEN  NONE SEEN   Clue Cells Wet Prep HPF POC NONE SEEN  NONE SEEN   WBC, Wet Prep HPF POC NONE SEEN  NONE SEEN  PREGNANCY, URINE      Result Value Ref Range   Preg Test, Ur POSITIVE (*) NEGATIVE  URINALYSIS, ROUTINE W REFLEX MICROSCOPIC      Result Value Ref Range   Color, Urine AMBER (*) YELLOW   APPearance CLOUDY (*) CLEAR   Specific Gravity, Urine 1.030  1.005 - 1.030   pH 6.0  5.0 - 8.0   Glucose, UA NEGATIVE  NEGATIVE mg/dL   Hgb urine dipstick NEGATIVE  NEGATIVE   Bilirubin Urine NEGATIVE  NEGATIVE   Ketones, ur >80 (*) NEGATIVE mg/dL   Protein, ur 30 (*) NEGATIVE mg/dL   Urobilinogen, UA 0.2  0.0 - 1.0 mg/dL   Nitrite NEGATIVE  NEGATIVE   Leukocytes, UA TRACE (*) NEGATIVE  URINE MICROSCOPIC-ADD ON      Result Value Ref Range   Squamous Epithelial / LPF MANY (*) RARE   WBC, UA 3-6  <3 WBC/hpf   Bacteria, UA RARE  RARE   Urine-Other MUCOUS PRESENT    CBC WITH DIFFERENTIAL      Result Value Ref Range   WBC 7.8  4.0 - 10.5 K/uL   RBC 5.06  3.87 - 5.11 MIL/uL   Hemoglobin 16.0 (*) 12.0 - 15.0 g/dL   HCT 08.644.6  57.836.0 - 46.946.0 %   MCV 88.1  78.0 - 100.0 fL   MCH 31.6  26.0 - 34.0 pg   MCHC 35.9  30.0 - 36.0 g/dL   RDW 62.913.9  52.811.5 - 41.315.5 %   Platelets 296  150 - 400 K/uL   Neutrophils Relative % 51  43 - 77 %   Neutro Abs 4.0  1.7 - 7.7 K/uL   Lymphocytes Relative 35  12 -  46 %   Lymphs Abs 2.8  0.7 - 4.0 K/uL   Monocytes Relative 13 (*) 3 - 12 %   Monocytes Absolute 1.1 (*) 0.1 - 1.0 K/uL   Eosinophils Relative 1  0 - 5 %   Eosinophils Absolute 0.1  0.0 - 0.7 K/uL   Basophils Relative 0  0 - 1 %   Basophils Absolute 0.0  0.0 - 0.1 K/uL  BASIC METABOLIC PANEL  Result Value Ref Range   Sodium 134 (*) 137 - 147 mEq/L   Potassium 3.7  3.7 - 5.3 mEq/L   Chloride 93 (*) 96 - 112 mEq/L   CO2 24  19 - 32 mEq/L   Glucose, Bld 82  70 - 99 mg/dL   BUN 8  6 - 23 mg/dL   Creatinine, Ser 1.61  0.50 - 1.10 mg/dL   Calcium 09.6  8.4 - 04.5 mg/dL   GFR calc non Af Amer >90  >90 mL/min   GFR calc Af Amer >90  >90 mL/min  HCG, QUANTITATIVE, PREGNANCY      Result Value Ref Range   hCG, Beta Chain, Quant, S 22800 (*) <5 mIU/mL  ABO/RH      Result Value Ref Range   ABO/RH(D) O POS        Imaging Review US Ob Comp Less 14 Wks  10/13/2013   CLINICAL DATA:  Pelvic pain.  EXAM: OBSTETRIC <14 WK [place] AND TRANSVAGINAL OB US  TECHNIQUE: Both transabdominal and transvaginal ultrasound examinations were performed for complete evaluation of the gestation as well as the maternal uterus, adnexal regions, and pelvic cul-de-sac. Transvaginal technique was performed to assess early pregnancy.  COMPARISON:  CT of the abdomen and pelvis performed 01/08/2011  FINDINGS: Intrauterine gestational sac: Visualized/normal in shape.  Yolk sac:  Yes  Embryo:  Yes  Cardiac Activity: Yes  Heart Rate:  Remains too small to fully characterize.  CRL:   2.1  mm   5 w 5d                  [place] EDC: 06/10/2014  Maternal uterus/adnexae: No subchorionic hemorrhage is noted. The uterus is unremarkable in appearance.  The right ovary is not visualized. The left ovary measures 4.2 x 3.1 x 3.2 cm. A mildly complex left ovarian cyst, measuring 2.6 cm in size, may reflect an organizing hemorrhagic cyst, given its lace-like pattern.  No free fluid is seen within the pelvic cul-de-sac.  IMPRESSION: 1. Single live  intrauterine pregnancy noted, with a crown-rump length of 2 mm, corresponding to a gestational age of [redacted] weeks 5 days. This matches the gestational age by LMP, reflecting an estimated date of delivery of June 10, 2014. 2. 2.6 cm complex left ovarian cyst may reflect an organizing hemorrhagic cyst.   Electronically Signed   By: Roanna Raider M.D.   On: 10/13/2013 03:59   US Ob Transvaginal  10/13/2013   CLINICAL DATA:  Pelvic pain.  EXAM: OBSTETRIC <14 WK [place] AND TRANSVAGINAL OB US  TECHNIQUE: Both transabdominal and transvaginal ultrasound examinations were performed for complete evaluation of the gestation as well as the maternal uterus, adnexal regions, and pelvic cul-de-sac. Transvaginal technique was performed to assess early pregnancy.  COMPARISON:  CT of the abdomen and pelvis performed 01/08/2011  FINDINGS: Intrauterine gestational sac: Visualized/normal in shape.  Yolk sac:  Yes  Embryo:  Yes  Cardiac Activity: Yes  Heart Rate:  Remains too small to fully characterize.  CRL:   2.1  mm   5 w 5d                  [place] EDC: 06/10/2014  Maternal uterus/adnexae: No subchorionic hemorrhage is noted. The uterus is unremarkable in appearance.  The right ovary is not visualized. The left ovary measures 4.2 x 3.1 x 3.2 cm. A mildly complex left ovarian cyst, measuring 2.6 cm in size, may reflect an organizing hemorrhagic cyst, given its lace-like  pattern.  No free fluid is seen within the pelvic cul-de-sac.  IMPRESSION: 1. Single live intrauterine pregnancy noted, with a crown-rump length of 2 mm, corresponding to a gestational age of [redacted] weeks 5 days. This matches the gestational age by LMP, reflecting an estimated date of delivery of June 10, 2014. 2. 2.6 cm complex left ovarian cyst may reflect an organizing hemorrhagic cyst.   Electronically Signed   By: Roanna Raider M.D.   On: 10/13/2013 03:59    EKG Interpretation   None       MDM   Final diagnoses:  Pregnancy  Ovarian cyst  Nausea/vomiting in  pregnancy        Angus Seller, PA-C 10/13/13 [phone]

## 2013-10-13 NOTE — Discharge Instructions (Signed)
You were found to have a positive pregnancy test today. Your testing confirmed an intrauterine pregnancy estimated to be 5 weeks 5 days. Your ultrasound test also showed that you have a 2.6 cm left ovarian cyst which may be causing some of your discomforts. Please followup with an OB/GYN specialist for continued evaluation and treatment of your symptoms and monitoring your pregnancy. Do not drink alcohol or use drugs during your pregnancy.    Pregnancy If you are planning on getting pregnant, it is a good idea to make a preconception appointment with your caregiver to discuss having a healthy lifestyle before getting pregnant. This includes diet, weight, exercise, taking prenatal vitamins (especially folic acid, which helps prevent brain and spinal cord defects), avoiding alcohol, smoking and illegal drugs, medical problems (diabetes, convulsions), family history of genetic problems, working conditions, and immunizations. It is better to have knowledge of these things and do something about them before getting pregnant. During your pregnancy, it is important to follow certain guidelines in order to have a healthy baby. It is very important to get good prenatal care and follow your caregiver's instructions. Prenatal care includes all the medical care you receive before your baby's birth. This helps to prevent problems during the pregnancy and childbirth. HOME CARE INSTRUCTIONS   Start your prenatal visits by the 12th week of pregnancy or earlier, if possible. At first, appointments are usually scheduled monthly. They become more frequent in the last 2 months before delivery. It is important that you keep your caregiver's appointments and follow your caregiver's instructions regarding medication use, exercise, and diet.  During pregnancy, you are providing food for you and your baby. Eat a regular, well-balanced diet. Choose foods such as meat, fish, milk and other dairy products, vegetables, fruits,  whole-grain breads and cereals. Your caregiver will inform you of the ideal weight gain depending on your current height and weight. Drink lots of liquids. Try to drink 8 glasses of water a day.  Alcohol is associated with a number of birth defects including fetal alcohol syndrome. It is best to avoid alcohol completely. Smoking will cause low birth rate and prematurity. Use of alcohol and nicotine during your pregnancy also increases the chances that your child will be chemically dependent later in their life and may contribute to SIDS (Sudden Infant Death Syndrome).  Do not use illegal drugs.  Only take prescription or over-the-counter medications that are recommended by your caregiver. Other medications can cause genetic and physical problems in the baby.  Morning sickness can often be helped by keeping soda crackers at the bedside. Eat a few before getting up in the morning.  A sexual relationship may be continued until near the end of pregnancy if there are no other problems such as early (premature) leaking of amniotic fluid from the membranes, vaginal bleeding, painful intercourse or belly (abdominal) pain.  Exercise regularly. Check with your caregiver if you are unsure of the safety of some of your exercises.  Do not use hot tubs, steam rooms or saunas. These increase the risk of fainting and hurting yourself and the baby. Swimming is OK for exercise. Get plenty of rest, including afternoon naps when possible, especially in the third trimester.  Avoid toxic odors and chemicals.  Do not wear high heels. They may cause you to lose your balance and fall.  Do not lift over 5 pounds. If you do lift anything, lift with your legs and thighs, not your back.  Avoid long trips, especially in the third  trimester.  If you have to travel out of the city or state, take a copy of your medical records with you. SEEK IMMEDIATE MEDICAL CARE IF:   You develop an unexplained oral temperature above  102 F (38.9 C), or as your caregiver suggests.  You have leaking of fluid from the vagina. If leaking membranes are suspected, take your temperature and inform your caregiver of this when you call.  There is vaginal spotting or bleeding. Notify your caregiver of the amount and how many pads are used.  You continue to feel sick to your stomach (nauseous) and have no relief from remedies suggested, or you throw up (vomit) blood or coffee ground like materials.  You develop upper abdominal pain.  You have round ligament discomfort in the lower abdominal area. This still must be evaluated by your caregiver.  You feel contractions of the uterus.  You do not feel the baby move, or there is less movement than before.  You have painful urination.  You have abnormal vaginal discharge.  You have persistent diarrhea.  You get a severe headache.  You have problems with your vision.  You develop muscle weakness.  You feel dizzy and faint.  You develop shortness of breath.  You develop chest pain.  You have back pain that travels down to your leg and feet.  You feel irregular or a very fast heartbeat.  You develop excessive weight gain in a short period of time (5 pounds in 3 to 5 days).  You are involved in a domestic violence situation. Document Released: 08/09/2005 Document Revised: 02/08/2012 Document Reviewed: 01/31/2009 [facility] Patient Information 2014 [place], [place].    Ovarian Cyst An ovarian cyst is a fluid-filled sac that forms on an ovary. The ovaries are small organs that produce eggs in women. Various types of cysts can form on the ovaries. Most are not cancerous. Many do not cause problems, and they often go away on their own. Some may cause symptoms and require treatment. Common types of ovarian cysts include:  Functional cysts These cysts may occur every month during the menstrual cycle. This is normal. The cysts usually go away with the next menstrual cycle if  the woman does not get pregnant. Usually, there are no symptoms with a functional cyst.  Endometrioma cysts These cysts form from the tissue that lines the uterus. They are also called "chocolate cysts" because they become filled with blood that turns brown. This type of cyst can cause pain in the lower abdomen during intercourse and with your menstrual period.  Cystadenoma cysts This type develops from the cells on the outside of the ovary. These cysts can get very big and cause lower abdomen pain and pain with intercourse. This type of cyst can twist on itself, cut off its blood supply, and cause severe pain. It can also easily rupture and cause a lot of pain.  Dermoid cysts This type of cyst is sometimes found in both ovaries. These cysts may contain different kinds of body tissue, such as skin, teeth, hair, or cartilage. They usually do not cause symptoms unless they get very big.  Theca lutein cysts These cysts occur when too much of a certain hormone (human chorionic gonadotropin) is produced and overstimulates the ovaries to produce an egg. This is most common after procedures used to assist with the conception of a baby (in vitro fertilization). CAUSES   Fertility drugs can cause a condition in which multiple large cysts are formed on the ovaries. This  is called ovarian hyperstimulation syndrome.  A condition called polycystic ovary syndrome can cause hormonal imbalances that can lead to nonfunctional ovarian cysts. SIGNS AND SYMPTOMS  Many ovarian cysts do not cause symptoms. If symptoms are present, they may include:  Pelvic pain or pressure.  Pain in the lower abdomen.  Pain during sexual intercourse.  Increasing girth (swelling) of the abdomen.  Abnormal menstrual periods.  Increasing pain with menstrual periods.  Stopping having menstrual periods without being pregnant. DIAGNOSIS  These cysts are commonly found during a routine or annual pelvic exam. Tests may be ordered  to find out more about the cyst. These tests may include:  Ultrasound.  X-ray of the pelvis.  CT scan.  MRI.  Blood tests. TREATMENT  Many ovarian cysts go away on their own without treatment. Your health care provider may want to check your cyst regularly for 2 3 months to see if it changes. For women in menopause, it is particularly important to monitor a cyst closely because of the higher rate of ovarian cancer in menopausal women. When treatment is needed, it may include any of the following:  A procedure to drain the cyst (aspiration). This may be done using a long needle and ultrasound. It can also be done through a laparoscopic procedure. This involves using a thin, lighted tube with a tiny camera on the end (laparoscope) inserted through a small incision.  Surgery to remove the whole cyst. This may be done using laparoscopic surgery or an open surgery involving a larger incision in the lower abdomen.  Hormone treatment or birth control pills. These methods are sometimes used to help dissolve a cyst. HOME CARE INSTRUCTIONS   Only take over-the-counter or prescription medicines as directed by your health care provider.  Follow up with your health care provider as directed.  Get regular pelvic exams and Pap tests. SEEK MEDICAL CARE IF:   Your periods are late, irregular, or painful, or they stop.  Your pelvic pain or abdominal pain does not go away.  Your abdomen becomes larger or swollen.  You have pressure on your bladder or trouble emptying your bladder completely.  You have pain during sexual intercourse.  You have feelings of fullness, pressure, or discomfort in your stomach.  You lose weight for no apparent reason.  You feel generally ill.  You become constipated.  You lose your appetite.  You develop acne.  You have an increase in body and facial hair.  You are gaining weight, without changing your exercise and eating habits.  You think you are  pregnant. SEEK IMMEDIATE MEDICAL CARE IF:   You have increasing abdominal pain.  You feel sick to your stomach (nauseous), and you throw up (vomit).  You develop a fever that comes on suddenly.  You have abdominal pain during a bowel movement.  Your menstrual periods become heavier than usual. Document Released: 08/09/2005 Document Revised: 05/30/2013 Document Reviewed: 04/16/2013 [facility] Patient Information 2014 [place], [place].    Morning Sickness Morning sickness is when you feel sick to your stomach (nauseous) during pregnancy. This nauseous feeling may or may not come with vomiting. It often occurs in the morning but can be a problem any time of day. Morning sickness is most common during the first trimester, but it may continue throughout pregnancy. While morning sickness is unpleasant, it is usually harmless unless you develop severe and continual vomiting (hyperemesis gravidarum). This condition requires more intense treatment.  CAUSES  The cause of morning sickness is not completely  known but seems to be related to normal hormonal changes that occur in pregnancy. RISK FACTORS You are at greater risk if you:  Experienced nausea or vomiting before your pregnancy.  Had morning sickness during a previous pregnancy.  Are pregnant with more than one baby, such as twins. TREATMENT  Do not use any medicines (prescription, over-the-counter, or herbal) for morning sickness without first talking to your health care provider. Your health care provider may prescribe or recommend:  Vitamin B6 supplements.  Anti-nausea medicines.  The herbal medicine ginger. HOME CARE INSTRUCTIONS   Only take over-the-counter or prescription medicines as directed by your health care provider.  Taking multivitamins before getting pregnant can prevent or decrease the severity of morning sickness in most women.   Eat a piece of dry toast or unsalted crackers before getting out of bed in the  morning.   Eat five or six small meals a day.   Eat dry and bland foods (rice, baked potato). Foods high in carbohydrates are often helpful.  Do not drink liquids with your meals. Drink liquids between meals.   Avoid greasy, fatty, and spicy foods.   Get someone to cook for you if the smell of any food causes nausea and vomiting.   If you feel nauseous after taking prenatal vitamins, take the vitamins at night or with a snack.  Snack on protein foods (nuts, yogurt, cheese) between meals if you are hungry.   Eat unsweetened gelatins for desserts.   Wearing an acupressure wristband (worn for sea sickness) may be helpful.   Acupuncture may be helpful.   Do not smoke.   Get a humidifier to keep the air in your house free of odors.   Get plenty of fresh air. SEEK MEDICAL CARE IF:   Your home remedies are not working, and you need medicine.  You feel dizzy or lightheaded.  You are losing weight. SEEK IMMEDIATE MEDICAL CARE IF:   You have persistent and uncontrolled nausea and vomiting.  You pass out (faint). Document Released: 09/30/2006 Document Revised: 04/11/2013 Document Reviewed: 01/24/2013 [facility] Patient Information 2014 [place], [place].

## 2013-10-14 NOTE — ED Provider Notes (Signed)
Medical screening examination/treatment/procedure(s) were performed by non-physician practitioner and as supervising physician I was immediately available for consultation/collaboration.   Divit Stipp, MD 10/14/13 0541 

## 2013-10-15 LAB — GC/CHLAMYDIA PROBE AMP
CT PROBE, AMP APTIMA: NEGATIVE
GC Probe RNA: NEGATIVE

## 2013-10-22 DIAGNOSIS — A499 Bacterial infection, unspecified: Secondary | ICD-10-CM | POA: Insufficient documentation

## 2013-10-22 DIAGNOSIS — O2390 Unspecified genitourinary tract infection in pregnancy, unspecified trimester: Secondary | ICD-10-CM | POA: Insufficient documentation

## 2013-10-22 DIAGNOSIS — Q2111 Secundum atrial septal defect: Secondary | ICD-10-CM | POA: Insufficient documentation

## 2013-10-22 DIAGNOSIS — R109 Unspecified abdominal pain: Secondary | ICD-10-CM | POA: Insufficient documentation

## 2013-10-22 DIAGNOSIS — O9989 Other specified diseases and conditions complicating pregnancy, childbirth and the puerperium: Secondary | ICD-10-CM | POA: Insufficient documentation

## 2013-10-22 DIAGNOSIS — Z8679 Personal history of other diseases of the circulatory system: Secondary | ICD-10-CM | POA: Insufficient documentation

## 2013-10-22 DIAGNOSIS — B9689 Other specified bacterial agents as the cause of diseases classified elsewhere: Secondary | ICD-10-CM | POA: Insufficient documentation

## 2013-10-22 DIAGNOSIS — R011 Cardiac murmur, unspecified: Secondary | ICD-10-CM | POA: Insufficient documentation

## 2013-10-22 DIAGNOSIS — Z79899 Other long term (current) drug therapy: Secondary | ICD-10-CM | POA: Insufficient documentation

## 2013-10-22 DIAGNOSIS — Z87440 Personal history of urinary (tract) infections: Secondary | ICD-10-CM | POA: Insufficient documentation

## 2013-10-22 DIAGNOSIS — Q211 Atrial septal defect: Secondary | ICD-10-CM | POA: Insufficient documentation

## 2013-10-22 DIAGNOSIS — O210 Mild hyperemesis gravidarum: Secondary | ICD-10-CM | POA: Insufficient documentation

## 2013-10-22 DIAGNOSIS — O99330 Smoking (tobacco) complicating pregnancy, unspecified trimester: Secondary | ICD-10-CM | POA: Insufficient documentation

## 2013-10-23 ENCOUNTER — Emergency Department (HOSPITAL_COMMUNITY)
Admission: EM | Admit: 2013-10-23 | Discharge: 2013-10-23 | Disposition: A | Discharge status: Home / Self Care | Payer: Medicaid Other | Attending: Emergency Medicine | Admitting: Emergency Medicine

## 2013-10-23 ENCOUNTER — Encounter (HOSPITAL_COMMUNITY): Payer: Self-pay | Admitting: Emergency Medicine

## 2013-10-23 DIAGNOSIS — R1110 Vomiting, unspecified: Secondary | ICD-10-CM

## 2013-10-23 DIAGNOSIS — N760 Acute vaginitis: Secondary | ICD-10-CM

## 2013-10-23 DIAGNOSIS — B9689 Other specified bacterial agents as the cause of diseases classified elsewhere: Secondary | ICD-10-CM

## 2013-10-23 LAB — WET PREP, GENITAL
Trich, Wet Prep: NONE SEEN
Yeast Wet Prep HPF POC: NONE SEEN

## 2013-10-23 LAB — CBC WITH DIFFERENTIAL/PLATELET
BASOS ABS: 0 10*3/uL (ref 0.0–0.1)
Basophils Relative: 0 % (ref 0–1)
EOS PCT: 2 % (ref 0–5)
Eosinophils Absolute: 0.2 10*3/uL (ref 0.0–0.7)
HCT: 42.9 % (ref 36.0–46.0)
Hemoglobin: 15.1 g/dL — ABNORMAL HIGH (ref 12.0–15.0)
LYMPHS ABS: 2.7 10*3/uL (ref 0.7–4.0)
LYMPHS PCT: 36 % (ref 12–46)
MCH: 30.6 pg (ref 26.0–34.0)
MCHC: 35.2 g/dL (ref 30.0–36.0)
MCV: 86.8 fL (ref 78.0–100.0)
Monocytes Absolute: 0.9 10*3/uL (ref 0.1–1.0)
Monocytes Relative: 11 % (ref 3–12)
NEUTROS ABS: 3.9 10*3/uL (ref 1.7–7.7)
Neutrophils Relative %: 51 % (ref 43–77)
PLATELETS: 261 10*3/uL (ref 150–400)
RBC: 4.94 MIL/uL (ref 3.87–5.11)
RDW: 13.6 % (ref 11.5–15.5)
WBC: 7.6 10*3/uL (ref 4.0–10.5)

## 2013-10-23 LAB — COMPREHENSIVE METABOLIC PANEL
ALK PHOS: 47 U/L (ref 39–117)
ALT: 18 U/L (ref 0–35)
AST: 16 U/L (ref 0–37)
Albumin: 4.1 g/dL (ref 3.5–5.2)
BILIRUBIN TOTAL: 0.4 mg/dL (ref 0.3–1.2)
BUN: 5 mg/dL — ABNORMAL LOW (ref 6–23)
CALCIUM: 9.7 mg/dL (ref 8.4–10.5)
CHLORIDE: 98 meq/L (ref 96–112)
CO2: 23 meq/L (ref 19–32)
Creatinine, Ser: 0.51 mg/dL (ref 0.50–1.10)
GFR calc non Af Amer: >90 mL/min (ref >90)
Glucose, Bld: 84 mg/dL (ref 70–99)
POTASSIUM: 3.6 meq/L — AB (ref 3.7–5.3)
Sodium: 136 mEq/L — ABNORMAL LOW (ref 137–147)
Total Protein: 7.8 g/dL (ref 6.0–8.3)

## 2013-10-23 LAB — URINALYSIS, ROUTINE W REFLEX MICROSCOPIC
Bilirubin Urine: NEGATIVE
GLUCOSE, UA: NEGATIVE mg/dL
Hgb urine dipstick: NEGATIVE
Ketones, ur: >80 mg/dL — AB
Nitrite: NEGATIVE
Protein, ur: NEGATIVE mg/dL
SPECIFIC GRAVITY, URINE: 1.021 (ref 1.005–1.030)
Urobilinogen, UA: 1 mg/dL (ref 0.0–1.0)
pH: 6 (ref 5.0–8.0)

## 2013-10-23 LAB — GC/CHLAMYDIA PROBE AMP
CT Probe RNA: NEGATIVE
GC Probe RNA: NEGATIVE

## 2013-10-23 LAB — URINE MICROSCOPIC-ADD ON

## 2013-10-23 LAB — LIPASE, BLOOD: Lipase: 28 U/L (ref 11–59)

## 2013-10-23 LAB — POC URINE PREG, ED: Preg Test, Ur: POSITIVE — AB

## 2013-10-23 MED ORDER — ONDANSETRON 4 MG PO TBDP: ORAL_TABLET | ORAL | Status: DC

## 2013-10-23 MED ORDER — ONDANSETRON HCL 4 MG/2ML IJ SOLN: 4.0000 mg | Freq: Once | INTRAMUSCULAR | Status: DC

## 2013-10-23 MED ORDER — SODIUM CHLORIDE 0.9 % IV BOLUS (SEPSIS)
1000.0000 mL | INTRAVENOUS | Status: AC
  Administered 2013-10-23: 1000 mL via INTRAVENOUS

## 2013-10-23 MED ORDER — METRONIDAZOLE 500 MG PO TABS: 500.0000 mg | ORAL_TABLET | Freq: Two times a day (BID) | ORAL | Status: DC

## 2013-10-23 MED ORDER — ONDANSETRON 8 MG PO TBDP
8.0000 mg | ORAL_TABLET | Freq: Once | ORAL | Status: AC
  Administered 2013-10-23: 8 mg via ORAL
  Filled 2013-10-23: qty 1

## 2013-10-23 NOTE — ED Notes (Signed)
Pt states she feels better and is able to keep down ginger ale

## 2013-10-23 NOTE — ED Notes (Signed)
Pt reports vomiting for the past 3 days and cannot keep anything down.  Pt has no appetite. Pt found out she was pregnant about 2 weeks ago. Pt a/o x 4. Pt reports feeling weak but reports being ambulatory.

## 2013-10-23 NOTE — Discharge Instructions (Signed)

## 2013-10-23 NOTE — ED Provider Notes (Signed)
CSN: [id number]     Arrival date & time 10/22/13  2357 History   First MD Initiated Contact with Patient 10/23/13 0154     Chief Complaint  Patient presents with  . Morning Sickness     (Consider location/radiation/quality/duration/timing/severity/associated sxs/prior Treatment) Patient is a 23 y.o. female presenting with vomiting. The history is provided by the patient.  Emesis Severity:  Moderate Duration:  3 days Timing:  Constant Quality:  Stomach contents Progression:  Unchanged Chronicity:  New Relieved by:  Nothing Worsened by:  Nothing tried Ineffective treatments:  None tried Associated symptoms: abdominal pain   Associated symptoms: no diarrhea, no fever and no headaches   Abdominal pain:    Pain location: lower abdomen.   Quality:  Cramping   Severity:  Mild   Onset quality:  Gradual   Timing:  Intermittent   Progression:  Unchanged   Past Medical History  Diagnosis Date  . Irregular heart beat 07/06/11  . UTI (lower urinary tract infection)   . Heart murmur   . PFO (patent foramen ovale)    History reviewed. No pertinent past surgical history. No family history on file. History  Substance Use Topics  . Smoking status: Current Every Day Smoker -- 0.25 packs/day    Types: Cigarettes  . Smokeless tobacco: Never Used  . Alcohol Use: Yes     Comment: occ.   OB History   Grav Para Term Preterm Abortions TAB SAB Ect Mult Living   1              Review of Systems  Constitutional: Negative for fever and fatigue.  HENT: Negative for congestion and drooling.   Eyes: Negative for pain.  Respiratory: Negative for cough and shortness of breath.   Cardiovascular: Negative for chest pain.  Gastrointestinal: Positive for nausea, vomiting and abdominal pain. Negative for diarrhea.  Genitourinary: Positive for vaginal discharge (white). Negative for dysuria and hematuria.  Musculoskeletal: Negative for back pain, gait problem and neck pain.  Skin: Negative for  color change.  Neurological: Negative for dizziness and headaches.  Hematological: Negative for adenopathy.  Psychiatric/Behavioral: Negative for behavioral problems.  All other systems reviewed and are negative.      Allergies  Cherry  Home Medications   Current Outpatient Rx  Name  Route  Sig  Dispense  Refill  . albuterol (PROVENTIL HFA;VENTOLIN HFA) 108 (90 BASE) MCG/ACT inhaler   Inhalation   Inhale 2 puffs into the lungs every 6 (six) hours as needed for wheezing or shortness of breath.         Marland Kitchen MICONAZOLE NITRATE VAGINAL (MONISTAT 3) 4 % CREA   Vaginal   Place 1 application vaginally 2 (two) times daily as needed (itching).         . naproxen sodium (ANAPROX) 220 MG tablet   Oral   Take 220 mg by mouth 2 (two) times daily as needed (pain).         . Prenatal Vit-Fe Fumarate-FA (PRENATAL COMPLETE) 14-0.4 MG TABS   Oral   Take 1 tablet by mouth daily.   60 each   0    BP 109/78  Pulse 66  Temp(Src) 98.4 F (36.9 C) (Oral)  Resp 18  SpO2 100%  LMP 09/03/2013 Physical Exam  Nursing note and vitals reviewed. Constitutional: She is oriented to person, place, and time. She appears well-developed and well-nourished.  HENT:  Head: Normocephalic.  Mouth/Throat: Oropharynx is clear and moist. No oropharyngeal exudate.  Eyes: Conjunctivae and  EOM are normal. Pupils are equal, round, and reactive to light.  Neck: Normal range of motion. Neck supple.  Cardiovascular: Normal rate, regular rhythm, normal heart sounds and intact distal pulses.  Exam reveals no gallop and no friction rub.   No murmur heard. Pulmonary/Chest: Effort normal and breath sounds normal. No respiratory distress. She has no wheezes.  Abdominal: Soft. Bowel sounds are normal. There is tenderness (Mild diffuse lower abdominal tenderness to palpation which does not seem to localize.). There is no rebound and no guarding.  Genitourinary:  Normal appearing external vagina. Normal appearing  cervix. Os closed. Mild amount of thick white fluid in posterior fornix. No CMT. Mild uterine ttp on bimanual.   Musculoskeletal: Normal range of motion. She exhibits no edema and no tenderness.  Neurological: She is alert and oriented to person, place, and time.  Skin: Skin is warm and dry.  Psychiatric: She has a normal mood and affect. Her behavior is normal.    ED Course  Procedures (including critical care time) Labs Review Labs Reviewed  WET PREP, GENITAL - Abnormal; Notable for the following:    Clue Cells Wet Prep HPF POC MANY (*)    WBC, Wet Prep HPF POC MANY (*)    All other components within normal limits  URINALYSIS, ROUTINE W REFLEX MICROSCOPIC - Abnormal; Notable for the following:    Color, Urine AMBER (*)    APPearance CLOUDY (*)    Ketones, ur >80 (*)    Leukocytes, UA SMALL (*)    All other components within normal limits  CBC WITH DIFFERENTIAL - Abnormal; Notable for the following:    Hemoglobin 15.1 (*)    All other components within normal limits  COMPREHENSIVE METABOLIC PANEL - Abnormal; Notable for the following:    Sodium 136 (*)    Potassium 3.6 (*)    BUN 5 (*)    All other components within normal limits  URINE MICROSCOPIC-ADD ON - Abnormal; Notable for the following:    Squamous Epithelial / LPF FEW (*)    All other components within normal limits  POC URINE PREG, ED - Abnormal; Notable for the following:    Preg Test, Ur POSITIVE (*)    All other components within normal limits  GC/CHLAMYDIA PROBE AMP  LIPASE, BLOOD   Imaging Review No results found.   EKG Interpretation None      MDM   Final diagnoses:  Vomiting  BV (bacterial vaginosis)    2:14 AM 23 y.o. female with recent diagnosis of pregnancy who presents with nausea and vomiting for the last 3 days. The patient was seen here recently and had an ultrasound which confirmed an intrauterine pregnancy as well as a left ovarian cyst. The patient states that her last menstrual period  was 09/03/2013. She states that she has had ongoing pelvic and left flank cramping for the last several weeks which is unchanged. She also notes occasional spotting. She notes slight worsening of white vaginal discharge since her last visit here. She denies any fevers or diarrhea. She is afebrile and vital signs are unremarkable here. She notes that she has not been able to tolerate oral intake the last 3 days and feels that she is getting dehydrated. Will get screening labwork, nausea control, IV fluids. Will also repeat pelvic due worsening vaginal discharge. The patient denies any history of previous STDs, only has one partner.  5:45 AM: Ketones in UA, otherwise labs non-contrib. Pt feeling much better, tolerating liquids here, would like  to go home. Will tx for BV given pt's ongoing bothersome and worsening vaginal d/c.  I have discussed the diagnosis/risks/treatment options with the patient and believe the pt to be eligible for discharge home to follow-up with her obgyn. We also discussed returning to the ED immediately if new or worsening sx occur. We discussed the sx which are most concerning (e.g., inability to tol po, worsening abd pain, fever, inc vomiting) that necessitate immediate return. Medications administered to the patient during their visit and any new prescriptions provided to the patient are listed below.  Medications given during this visit Medications  ondansetron (ZOFRAN) injection 4 mg (0 mg Intravenous Hold 10/23/13 0236)  ondansetron (ZOFRAN-ODT) disintegrating tablet 8 mg (8 mg Oral Given 10/23/13 0045)  sodium chloride 0.9 % bolus 1,000 mL (0 mLs Intravenous Stopped 10/23/13 0419)    Discharge Medication List as of 10/23/2013  5:47 AM    START taking these medications   Details  metroNIDAZOLE (FLAGYL) 500 MG tablet Take 1 tablet (500 mg total) by mouth 2 (two) times daily. One po bid x 7 days, Starting 10/23/2013, Until Discontinued, Print    ondansetron (ZOFRAN ODT) 4 MG  disintegrating tablet 4mg  ODT q4 hours prn nausea/vomit, Print         Junius ArgyleForrest S Malique Driskill, MD 10/23/13 [phone]

## 2013-11-04 ENCOUNTER — Encounter (HOSPITAL_COMMUNITY): Payer: Self-pay | Admitting: Emergency Medicine

## 2013-11-04 ENCOUNTER — Observation Stay (HOSPITAL_COMMUNITY)
Admission: EM | Admit: 2013-11-04 | Discharge: 2013-11-06 | Discharge status: Against Medical Advice | Payer: Medicaid Other | Attending: Internal Medicine | Admitting: Internal Medicine

## 2013-11-04 DIAGNOSIS — O99330 Smoking (tobacco) complicating pregnancy, unspecified trimester: Secondary | ICD-10-CM | POA: Insufficient documentation

## 2013-11-04 DIAGNOSIS — O99891 Other specified diseases and conditions complicating pregnancy: Principal | ICD-10-CM | POA: Insufficient documentation

## 2013-11-04 DIAGNOSIS — R55 Syncope and collapse: Secondary | ICD-10-CM | POA: Diagnosis present

## 2013-11-04 DIAGNOSIS — O9989 Other specified diseases and conditions complicating pregnancy, childbirth and the puerperium: Principal | ICD-10-CM

## 2013-11-04 DIAGNOSIS — Z3490 Encounter for supervision of normal pregnancy, unspecified, unspecified trimester: Secondary | ICD-10-CM

## 2013-11-04 NOTE — ED Notes (Signed)
Pt states she is 9W preg, +abd pain, weak, pt states her [place] [employer]

## 2013-11-05 ENCOUNTER — Encounter (HOSPITAL_COMMUNITY): Payer: Self-pay | Admitting: Internal Medicine

## 2013-11-05 ENCOUNTER — Observation Stay (HOSPITAL_COMMUNITY)
Admit: 2013-11-05 | Discharge: 2013-11-05 | Disposition: A | Discharge status: Home / Self Care | Payer: Medicaid Other | Attending: Internal Medicine | Admitting: Internal Medicine

## 2013-11-05 ENCOUNTER — Encounter: Payer: Self-pay | Admitting: Obstetrics and Gynecology

## 2013-11-05 ENCOUNTER — Emergency Department (HOSPITAL_COMMUNITY): Payer: Medicaid Other

## 2013-11-05 DIAGNOSIS — Z331 Pregnant state, incidental: Secondary | ICD-10-CM

## 2013-11-05 DIAGNOSIS — R55 Syncope and collapse: Secondary | ICD-10-CM

## 2013-11-05 LAB — CBC WITH DIFFERENTIAL/PLATELET
Basophils Absolute: 0 10*3/uL (ref 0.0–0.1)
Basophils Relative: 0 % (ref 0–1)
EOS ABS: 0.1 10*3/uL (ref 0.0–0.7)
EOS PCT: 1 % (ref 0–5)
HEMATOCRIT: 36.3 % (ref 36.0–46.0)
HEMOGLOBIN: 12.6 g/dL (ref 12.0–15.0)
LYMPHS ABS: 3.8 10*3/uL (ref 0.7–4.0)
Lymphocytes Relative: 38 % (ref 12–46)
MCH: 30.1 pg (ref 26.0–34.0)
MCHC: 34.7 g/dL (ref 30.0–36.0)
MCV: 86.6 fL (ref 78.0–100.0)
MONO ABS: 0.9 10*3/uL (ref 0.1–1.0)
MONOS PCT: 9 % (ref 3–12)
NEUTROS PCT: 51 % (ref 43–77)
Neutro Abs: 5.1 10*3/uL (ref 1.7–7.7)
Platelets: 229 10*3/uL (ref 150–400)
RBC: 4.19 MIL/uL (ref 3.87–5.11)
RDW: 13.4 % (ref 11.5–15.5)
WBC: 9.9 10*3/uL (ref 4.0–10.5)

## 2013-11-05 LAB — URINALYSIS, ROUTINE W REFLEX MICROSCOPIC
BILIRUBIN URINE: NEGATIVE
Glucose, UA: NEGATIVE mg/dL
Hgb urine dipstick: NEGATIVE
KETONES UR: NEGATIVE mg/dL
Nitrite: NEGATIVE
Protein, ur: NEGATIVE mg/dL
SPECIFIC GRAVITY, URINE: 1.009 (ref 1.005–1.030)
UROBILINOGEN UA: 0.2 mg/dL (ref 0.0–1.0)
pH: 6 (ref 5.0–8.0)

## 2013-11-05 LAB — COMPREHENSIVE METABOLIC PANEL
ALBUMIN: 3.2 g/dL — AB (ref 3.5–5.2)
ALT: 19 U/L (ref 0–35)
AST: 15 U/L (ref 0–37)
Alkaline Phosphatase: 34 U/L — ABNORMAL LOW (ref 39–117)
BUN: 6 mg/dL (ref 6–23)
CALCIUM: 8.9 mg/dL (ref 8.4–10.5)
CO2: 22 meq/L (ref 19–32)
Chloride: 102 mEq/L (ref 96–112)
Creatinine, Ser: 0.42 mg/dL — ABNORMAL LOW (ref 0.50–1.10)
GFR calc Af Amer: >90 mL/min (ref >90)
Glucose, Bld: 90 mg/dL (ref 70–99)
Potassium: 3.5 mEq/L — ABNORMAL LOW (ref 3.7–5.3)
SODIUM: 136 meq/L — AB (ref 137–147)
Total Bilirubin: 0.2 mg/dL — ABNORMAL LOW (ref 0.3–1.2)
Total Protein: 6 g/dL (ref 6.0–8.3)

## 2013-11-05 LAB — BASIC METABOLIC PANEL
BUN: 9 mg/dL (ref 6–23)
CHLORIDE: 97 meq/L (ref 96–112)
CO2: 22 mEq/L (ref 19–32)
CREATININE: 0.53 mg/dL (ref 0.50–1.10)
Calcium: 9.4 mg/dL (ref 8.4–10.5)
Glucose, Bld: 88 mg/dL (ref 70–99)
POTASSIUM: 3.6 meq/L — AB (ref 3.7–5.3)
Sodium: 135 mEq/L — ABNORMAL LOW (ref 137–147)

## 2013-11-05 LAB — CBC
HCT: 39.2 % (ref 36.0–46.0)
Hemoglobin: 13.8 g/dL (ref 12.0–15.0)
MCH: 30.7 pg (ref 26.0–34.0)
MCHC: 35.2 g/dL (ref 30.0–36.0)
MCV: 87.1 fL (ref 78.0–100.0)
Platelets: 254 10*3/uL (ref 150–400)
RBC: 4.5 MIL/uL (ref 3.87–5.11)
RDW: 13.3 % (ref 11.5–15.5)
WBC: 10.4 10*3/uL (ref 4.0–10.5)

## 2013-11-05 LAB — URINE MICROSCOPIC-ADD ON

## 2013-11-05 LAB — TSH: TSH: 0.432 u[IU]/mL (ref 0.350–4.500)

## 2013-11-05 LAB — D-DIMER, QUANTITATIVE (NOT AT ARMC): D-Dimer, Quant: 0.28 ug/mL-FEU (ref 0.00–0.48)

## 2013-11-05 LAB — TROPONIN I: Troponin I: <0.3 ng/mL (ref <0.30)

## 2013-11-05 LAB — MAGNESIUM: Magnesium: 1.6 mg/dL (ref 1.5–2.5)

## 2013-11-05 MED ORDER — SODIUM CHLORIDE 0.9 % IJ SOLN: 3.0000 mL | Freq: Two times a day (BID) | INTRAMUSCULAR | Status: DC

## 2013-11-05 MED ORDER — PRENATAL MULTIVITAMIN CH
1.0000 | ORAL_TABLET | Freq: Every day | ORAL | Status: DC
  Administered 2013-11-05: 1 via ORAL
  Filled 2013-11-05 (×2): qty 1

## 2013-11-05 MED ORDER — ALBUTEROL SULFATE (2.5 MG/3ML) 0.083% IN NEBU: 2.5000 mg | INHALATION_SOLUTION | Freq: Four times a day (QID) | RESPIRATORY_TRACT | Status: DC | PRN

## 2013-11-05 MED ORDER — ACETAMINOPHEN 325 MG PO TABS
650.0000 mg | ORAL_TABLET | Freq: Four times a day (QID) | ORAL | Status: DC | PRN
Start: 2013-11-05 — End: 2013-11-06

## 2013-11-05 MED ORDER — SODIUM CHLORIDE 0.9 % IV BOLUS (SEPSIS)
1000.0000 mL | Freq: Once | INTRAVENOUS | Status: AC
  Administered 2013-11-05: 1000 mL via INTRAVENOUS

## 2013-11-05 MED ORDER — ENOXAPARIN SODIUM 40 MG/0.4ML ~~LOC~~ SOLN
40.0000 mg | SUBCUTANEOUS | Status: DC
  Filled 2013-11-05 (×2): qty 0.4

## 2013-11-05 MED ORDER — ONDANSETRON HCL 4 MG PO TABS: 4.0000 mg | ORAL_TABLET | Freq: Four times a day (QID) | ORAL | Status: DC | PRN

## 2013-11-05 MED ORDER — SODIUM CHLORIDE 0.9 % IJ SOLN
3.0000 mL | Freq: Two times a day (BID) | INTRAMUSCULAR | Status: DC
  Administered 2013-11-05 (×2): 3 mL via INTRAVENOUS

## 2013-11-05 MED ORDER — ONDANSETRON HCL 4 MG/2ML IJ SOLN: 4.0000 mg | Freq: Four times a day (QID) | INTRAMUSCULAR | Status: DC | PRN

## 2013-11-05 MED ORDER — ACETAMINOPHEN 650 MG RE SUPP: 650.0000 mg | Freq: Four times a day (QID) | RECTAL | Status: DC | PRN

## 2013-11-05 NOTE — Progress Notes (Signed)
  Echocardiogram 2D Echocardiogram has been performed.  Cathie BeamsGREGORY, Brittainy Bucker 11/05/2013, 12:10 PM

## 2013-11-05 NOTE — Progress Notes (Signed)
Spoke with pt related to admission referral to Child psychotherapistsocial worker, pt having difficulties with purchasing medications. Pt states that she is not on any medications, no problems, [facility]. Will continue to follow for discharge needs.

## 2013-11-05 NOTE — Progress Notes (Unsigned)
Consultation History and Physical Exam for an Obstetrics Patient  Veronica Knight is a 23 y.o. female, G1P0, at 8 weeks 5 days, who presents for syncopal episodes of uncertain etiology. The patient was evaluated at [facility] emergency department on 10/13/2013 for irregular bleeding. An ultrasound showed a 5 week and 5 day viable intrauterine gestation with a due date of 06/10/2014. She continued to have irregular spotting until one week ago. At that time the spotting completely resolved. Her blood type is O positive. The patient had 3 syncopal episode yesterday. At one point she fell and hit her face. Today she complains of nausea and diarrhea of uncertain etiology. This was an unintended pregnancy. I was asked to consult on the patient. She has not been seen in my office during this pregnancy. Gonorrhea and Chlamydia cultures were negative. See history below.  OB History   Grav Para Term Preterm Abortions TAB SAB Ect Mult Living   1               Past Medical History  Diagnosis Date  . Irregular heart beat 07/06/11  . UTI (lower urinary tract infection)   . Heart murmur   . PFO (patent foramen ovale)      (Not in a hospital admission)  Past Surgical History  Procedure Laterality Date  . No past surgeries      Allergies  Allergen Reactions  . Cherry Anaphylaxis, Swelling and Other (See Comments)    Childhood allergy; reaction unknown, tongue swelling    Family History: family history includes Breast cancer in her other; Diabetes Mellitus II in her other. There is no history of CAD or Stroke.  Social History:  reports that she has been smoking Cigarettes.  She has been smoking about 0.25 packs per day. She has never used smokeless tobacco. She reports that she does not drink alcohol or use illicit drugs.  Review of systems: Normal pregnancy complaints.  Admission Physical Exam:  Last menstrual period 09/03/2013.  HEENT:                 Within normal limits, nose  is slightly swollen, tender particularly on the right Chest:                   Clear Heart:                    Regular rate and rhythm Abdomen:             Nontender Extremities:          Grossly normal Neurologic exam: Grossly normal  Prenatal labs: ABO, Rh:             --/--/O POS (02/21 0249) HBsAg:                    HIV:                       NON REACTIVE (02/21 0249)  GBS:                       Antibody:                Rubella:                  RPR:                    NON REACTIVE (02/21 0249)  Results for orders placed during the hospital encounter of 11/04/13 (from the past 24 hour(s))  BASIC METABOLIC PANEL     Status: Abnormal   Collection Time    11/04/13 11:58 PM      Result Value Ref Range   Sodium 135 (*) 137 - 147 mEq/L   Potassium 3.6 (*) 3.7 - 5.3 mEq/L   Chloride 97  96 - 112 mEq/L   CO2 22  19 - 32 mEq/L   Glucose, Bld 88  70 - 99 mg/dL   BUN 9  6 - 23 mg/dL   Creatinine, Ser 1.61  0.50 - 1.10 mg/dL   Calcium 9.4  8.4 - 09.6 mg/dL   GFR calc non Af Amer >90  >90 mL/min   GFR calc Af Amer >90  >90 mL/min  CBC     Status: None   Collection Time    11/04/13 11:58 PM      Result Value Ref Range   WBC 10.4  4.0 - 10.5 K/uL   RBC 4.50  3.87 - 5.11 MIL/uL   Hemoglobin 13.8  12.0 - 15.0 g/dL   HCT 04.5  40.9 - 81.1 %   MCV 87.1  78.0 - 100.0 fL   MCH 30.7  26.0 - 34.0 pg   MCHC 35.2  30.0 - 36.0 g/dL   RDW 91.4  78.2 - 95.6 %   Platelets 254  150 - 400 K/uL  URINALYSIS, ROUTINE W REFLEX MICROSCOPIC     Status: Abnormal   Collection Time    11/05/13 12:37 AM      Result Value Ref Range   Color, Urine STRAW (*) YELLOW   APPearance CLEAR  CLEAR   Specific Gravity, Urine 1.009  1.005 - 1.030   pH 6.0  5.0 - 8.0   Glucose, UA NEGATIVE  NEGATIVE mg/dL   Hgb urine dipstick NEGATIVE  NEGATIVE   Bilirubin Urine NEGATIVE  NEGATIVE   Ketones, ur NEGATIVE  NEGATIVE mg/dL   Protein, ur NEGATIVE  NEGATIVE mg/dL   Urobilinogen, UA 0.2  0.0 - 1.0 mg/dL    Nitrite NEGATIVE  NEGATIVE   Leukocytes, UA TRACE (*) NEGATIVE  URINE MICROSCOPIC-ADD ON     Status: None   Collection Time    11/05/13 12:37 AM      Result Value Ref Range   WBC, UA 3-6  <3 WBC/hpf   RBC / HPF 0-2  <3 RBC/hpf   Bacteria, UA RARE  RARE  D-DIMER, QUANTITATIVE     Status: None   Collection Time    11/05/13  1:32 AM      Result Value Ref Range   D-Dimer, Quant 0.28  0.00 - 0.48 ug/mL-FEU  MAGNESIUM     Status: None   Collection Time    11/05/13  5:20 AM      Result Value Ref Range   Magnesium 1.6  1.5 - 2.5 mg/dL  TSH     Status: None   Collection Time    11/05/13  5:20 AM      Result Value Ref Range   TSH 0.432  0.350 - 4.500 uIU/mL  COMPREHENSIVE METABOLIC PANEL     Status: Abnormal   Collection Time    11/05/13  5:20 AM      Result Value Ref Range   Sodium 136 (*) 137 - 147 mEq/L   Potassium 3.5 (*) 3.7 - 5.3 mEq/L   Chloride 102  96 - 112 mEq/L   CO2 22  19 - 32 mEq/L   Glucose, Bld 90  70 - 99 mg/dL   BUN 6  6 - 23 mg/dL   Creatinine, Ser 1.61 (*) 0.50 - 1.10 mg/dL   Calcium 8.9  8.4 - 09.6 mg/dL   Total Protein 6.0  6.0 - 8.3 g/dL   Albumin 3.2 (*) 3.5 - 5.2 g/dL   AST 15  0 - 37 U/L   ALT 19  0 - 35 U/L   Alkaline Phosphatase 34 (*) 39 - 117 U/L   Total Bilirubin 0.2 (*) 0.3 - 1.2 mg/dL   GFR calc non Af Amer >90  >90 mL/min   GFR calc Af Amer >90  >90 mL/min  CBC WITH DIFFERENTIAL     Status: None   Collection Time    11/05/13  5:20 AM      Result Value Ref Range   WBC 9.9  4.0 - 10.5 K/uL   RBC 4.19  3.87 - 5.11 MIL/uL   Hemoglobin 12.6  12.0 - 15.0 g/dL   HCT 04.5  40.9 - 81.1 %   MCV 86.6  78.0 - 100.0 fL   MCH 30.1  26.0 - 34.0 pg   MCHC 34.7  30.0 - 36.0 g/dL   RDW 91.4  78.2 - 95.6 %   Platelets 229  150 - 400 K/uL   Neutrophils Relative % 51  43 - 77 %   Neutro Abs 5.1  1.7 - 7.7 K/uL   Lymphocytes Relative 38  12 - 46 %   Lymphs Abs 3.8  0.7 - 4.0 K/uL   Monocytes Relative 9  3 - 12 %   Monocytes Absolute 0.9  0.1 - 1.0 K/uL    Eosinophils Relative 1  0 - 5 %   Eosinophils Absolute 0.1  0.0 - 0.7 K/uL   Basophils Relative 0  0 - 1 %   Basophils Absolute 0.0  0.0 - 0.1 K/uL  TROPONIN I     Status: None   Collection Time    11/05/13  5:20 AM      Result Value Ref Range   Troponin I <0.30  <0.30 ng/mL   Ultrasound: Viable intrauterine gestation with a gestational size approximately 8 weeks and 6 days. Otherwise normal.  Assessment:  8 weeks and 5 day viable intrauterine gestation  Syncopal episode of uncertain etiology  Diarrhea  Nausea  Plan:  I do not think that the patient's pregnancy is the etiology of her syncopal episodes.  It is not unusual for the patient to have nausea associated with an early gestation. Diclegis, Zofran, and Phenergan are safe to use for nausea during early pregnancy.   I do not think that the patient's diarrhea is a result of her pregnancy.  The patient was informed to call our office to schedule a new OB exam once she leaves the hospital.  One should minimize radiation to the patient and fetus during this early gestational time.  Prenatal vitamins should be taken daily.  Jaxsyn Catalfamo V 11/05/2013, 9:20 PM

## 2013-11-05 NOTE — Progress Notes (Signed)
EEG completed; results pending.    

## 2013-11-05 NOTE — Progress Notes (Signed)
UR completed 

## 2013-11-05 NOTE — Consult Note (Signed)
CARDIOLOGY CONSULT NOTE       Patient ID: Manson AllanOlivia N Fox-McWhite MRN: [id number] DOB/AGE: 02/24/1991 23 y.o.  Admit date: 11/04/2013 Referring Physician:  Thedore MinsSingh Primary Physician: PROVIDER NOT IN SYSTEM Primary Cardiologist:  New  Reason for Consultation:  Syncope   Principal Problem:   Syncope Active Problems:   Pregnancy   HPI:   23 yo [redacted] weeks pregnant.  Has not seen her OB Dierdre ForthVanessa Haygood due to lack of insurance.  [facility] and fetl "hot"  ? Passed out.  No seizure activity or incontinence.  Has had morning sickness with nausea and some diarrhea.  No fever Rx for gardnerella with bactrim recently  No palpitations, chest pain or dyspnea.  This am had recurrence  Again had nausea felt "sick"  Drove herself to hospital but does not remember entire trip.  Distant history of heart murmur.  No family history of sudden death or In ER fetal echo ok.  So far ECG normal , normal telemetry  She has no headache, dyspnea or edema.  No calf pain.  Has not had "syncope" before.  On no meds other then vitamins   ROS All other systems reviewed and negative except as noted above  Past Medical History  Diagnosis Date  . Irregular heart beat 07/06/11  . UTI (lower urinary tract infection)   . Heart murmur   . PFO (patent foramen ovale)     Family History  Problem Relation Age of Onset  . Diabetes Mellitus II Other   . Breast cancer Other   . CAD Neg Hx   . Stroke Neg Hx     History   Social History  . Marital Status: Single    Spouse Name: N/A    Number of Children: N/A  . Years of Education: N/A   Occupational History  . Not on file.   Social History Main Topics  . Smoking status: Current Some Day Smoker -- 0.25 packs/day    Types: Cigarettes  . Smokeless tobacco: Never Used  . Alcohol Use: No     Comment: not drinking since she found out she was pregnant  . Drug Use: No  . Sexual Activity: Yes   Other Topics Concern  . Not on file   Social History  Narrative  . No narrative on file    Past Surgical History  Procedure Laterality Date  . No past surgeries       . enoxaparin (LOVENOX) injection  40 mg Subcutaneous Q24H  . prenatal multivitamin  1 tablet Oral Daily  . sodium chloride  3 mL Intravenous Q12H  . sodium chloride  3 mL Intravenous Q12H      Physical Exam: Blood pressure 119/77, pulse 95, temperature 98.5 F (36.9 C), temperature source Oral, resp. rate 18, height 5\' 8"  (1.727 m), weight 143 lb 6.4 oz (65.046 kg), last menstrual period 09/03/2013, SpO2 100.00%.   Affect appropriate Thin black female  HEENT: normal Neck supple with no adenopathy JVP normal no bruits no thyromegaly Lungs clear with no wheezing and good diaphragmatic motion Heart:  S1/S2 no murmur, no rub, gallop or click PMI normal Abdomen: benighn, BS positve, no tenderness, no AAA consistent with gestational age  no bruit.  No HSM or HJR Distal pulses intact with no bruits No edema Neuro non-focal Skin warm and dry No muscular weakness   Labs:   Lab Results  Component Value Date   WBC 9.9 11/05/2013   HGB 12.6 11/05/2013  HCT 36.3 11/05/2013   MCV 86.6 11/05/2013   PLT 229 11/05/2013    Recent Labs Lab 11/05/13 0520  NA 136*  K 3.5*  CL 102  CO2 22  BUN 6  CREATININE 0.42*  CALCIUM 8.9  PROT 6.0  BILITOT 0.2*  ALKPHOS 34*  ALT 19  AST 15  GLUCOSE 90   Lab Results  Component Value Date   TROPONINI <0.30 11/05/2013    Scheduled Meds: . enoxaparin (LOVENOX) injection  40 mg Subcutaneous Q24H  . prenatal multivitamin  1 tablet Oral Daily  . sodium chloride  3 mL Intravenous Q12H  . sodium chloride  3 mL Intravenous Q12H   Continuous Infusions:  PRN Meds:.acetaminophen, albuterol, ondansetron (ZOFRAN) IV     Radiology: US Ob Comp Less 14 Wks  11/05/2013   CLINICAL DATA:  Pregnant patient.  Status post fall.  EXAM: OBSTETRIC <14 WK [place] AND TRANSVAGINAL OB US  TECHNIQUE: Both transabdominal and transvaginal ultrasound  examinations were performed for complete evaluation of the gestation as well as the maternal uterus, adnexal regions, and pelvic cul-de-sac. Transvaginal technique was performed to assess early pregnancy.  COMPARISON:  Ultrasound 10/13/2013.  FINDINGS: Intrauterine gestational sac: Visualized/normal in shape.  Yolk sac:  Visualized.  Embryo:  Visualized.  Cardiac Activity: Detected.  Heart Rate:  169 beats per min.  CRL:   2.24  mm   8 w 6 d                  [place] EDC: 06/11/2014.  Maternal uterus/adnexae: 2.9 cm in diameter simple appearing left ovarian cyst is noted. Adnexa are otherwise unremarkable.  IMPRESSION: No acute finding.  Single living intrauterine pregnancy.   Electronically Signed   By: Drusilla Kanner M.D.   On: 11/05/2013 02:25   US Ob Comp Less 14 Wks  10/13/2013   CLINICAL DATA:  Pelvic pain.  EXAM: OBSTETRIC <14 WK [place] AND TRANSVAGINAL OB US  TECHNIQUE: Both transabdominal and transvaginal ultrasound examinations were performed for complete evaluation of the gestation as well as the maternal uterus, adnexal regions, and pelvic cul-de-sac. Transvaginal technique was performed to assess early pregnancy.  COMPARISON:  CT of the abdomen and pelvis performed 01/08/2011  FINDINGS: Intrauterine gestational sac: Visualized/normal in shape.  Yolk sac:  Yes  Embryo:  Yes  Cardiac Activity: Yes  Heart Rate:  Remains too small to fully characterize.  CRL:   2.1  mm   5 w 5d                  [place] EDC: 06/10/2014  Maternal uterus/adnexae: No subchorionic hemorrhage is noted. The uterus is unremarkable in appearance.  The right ovary is not visualized. The left ovary measures 4.2 x 3.1 x 3.2 cm. A mildly complex left ovarian cyst, measuring 2.6 cm in size, may reflect an organizing hemorrhagic cyst, given its lace-like pattern.  No free fluid is seen within the pelvic cul-de-sac.  IMPRESSION: 1. Single live intrauterine pregnancy noted, with a crown-rump length of 2 mm, corresponding to a gestational age of [redacted]  weeks 5 days. This matches the gestational age by LMP, reflecting an estimated date of delivery of June 10, 2014. 2. 2.6 cm complex left ovarian cyst may reflect an organizing hemorrhagic cyst.   Electronically Signed   By: Roanna Raider M.D.   On: 10/13/2013 03:59   US Ob Transvaginal  11/05/2013   CLINICAL DATA:  Pregnant patient.  Status post fall.  EXAM: OBSTETRIC <14 WK [place]  AND TRANSVAGINAL OB US  TECHNIQUE: Both transabdominal and transvaginal ultrasound examinations were performed for complete evaluation of the gestation as well as the maternal uterus, adnexal regions, and pelvic cul-de-sac. Transvaginal technique was performed to assess early pregnancy.  COMPARISON:  Ultrasound 10/13/2013.  FINDINGS: Intrauterine gestational sac: Visualized/normal in shape.  Yolk sac:  Visualized.  Embryo:  Visualized.  Cardiac Activity: Detected.  Heart Rate:  169 beats per min.  CRL:   2.24  mm   8 w 6 d                  [place] EDC: 06/11/2014.  Maternal uterus/adnexae: 2.9 cm in diameter simple appearing left ovarian cyst is noted. Adnexa are otherwise unremarkable.  IMPRESSION: No acute finding.  Single living intrauterine pregnancy.   Electronically Signed   By: Drusilla Kanner M.D.   On: 11/05/2013 02:25   US Ob Transvaginal  10/13/2013   CLINICAL DATA:  Pelvic pain.  EXAM: OBSTETRIC <14 WK [place] AND TRANSVAGINAL OB US  TECHNIQUE: Both transabdominal and transvaginal ultrasound examinations were performed for complete evaluation of the gestation as well as the maternal uterus, adnexal regions, and pelvic cul-de-sac. Transvaginal technique was performed to assess early pregnancy.  COMPARISON:  CT of the abdomen and pelvis performed 01/08/2011  FINDINGS: Intrauterine gestational sac: Visualized/normal in shape.  Yolk sac:  Yes  Embryo:  Yes  Cardiac Activity: Yes  Heart Rate:  Remains too small to fully characterize.  CRL:   2.1  mm   5 w 5d                  [place] EDC: 06/10/2014  Maternal uterus/adnexae: No subchorionic  hemorrhage is noted. The uterus is unremarkable in appearance.  The right ovary is not visualized. The left ovary measures 4.2 x 3.1 x 3.2 cm. A mildly complex left ovarian cyst, measuring 2.6 cm in size, may reflect an organizing hemorrhagic cyst, given its lace-like pattern.  No free fluid is seen within the pelvic cul-de-sac.  IMPRESSION: 1. Single live intrauterine pregnancy noted, with a crown-rump length of 2 mm, corresponding to a gestational age of [redacted] weeks 5 days. This matches the gestational age by LMP, reflecting an estimated date of delivery of June 10, 2014. 2. 2.6 cm complex left ovarian cyst may reflect an organizing hemorrhagic cyst.   Electronically Signed   By: Roanna Raider M.D.   On: 10/13/2013 03:59    EKG:  SR rate 74 normal including QT 368    ASSESSMENT AND PLAN:  Syncope:  No obvious cardiac etiology  ECG normal Telemetry normal with SR.  Some vagal components associated with pregnancy and nausea.  Check echo  Cardiac MRI is contraindicated in first trimester Can consider consulting EP regarding ILR but would start with echo.   Pregnancy:  She has not had prenatal care She is applying for medicaid.  Should see if Dr Pennie Rushing can see her in hospital.  Fetal US ok in ER  Note patient was notified that she cannot drive for 6 months especially since she had "syncope" while driving to hospital and does not remember the entire trip to ER   Signed: Charlton Haws 11/05/2013, 8:39 AM

## 2013-11-05 NOTE — ED Provider Notes (Signed)
CSN: [id number]     Arrival date & time 11/04/13  2333 History   First MD Initiated Contact with Patient 11/04/13 2351     Chief Complaint  Patient presents with  . Loss of Consciousness     (Consider location/radiation/quality/duration/timing/severity/associated sxs/prior Treatment) HPI 23 yo female, G1P0 at about 9 weeks presents to the ER from home with complaint of syncopal episode, fall, and abdominal/pelvic cramping.  Pt reports she has had n/v for the previous two days, but today has been feeling well, drinking water and gingerale through the day and eating well.  Around 7 pm she was talking with her mother next to her mother's car outside of her grandmother's house and felt nauseated and hot.  She does not remember passing out or hitting her head, but found herself sitting on the ground with her mother asking her if she was ok.  Pt reports her mother told her that she had struck her face on the door of the car and then landed on her bottom.  Her mother took her with her to work, had her drink something, and then the patient drove herself back to her grandmother's house.  While sitting on the couch with her grandmother, pt either was falling asleep or losing consciousness (difficult to tell from pt's history).  Pt then [place] herself [place] subway.  [place] subway, pt had another episode of blacking out-does not feel she hit her head or fell, but does not remember anything after paying for her food, and woke to find herself sitting a booth with water spilled over her jacked and the subway workers hovering over her asking if she was ok.  Pt reports she felt hot and nauseated while standing in subway prior to this event.  Pt reports she finally decided to come to the ER when she did not feel well as she was lying down to sleep.    Pt recently treated for BV, finished abx (flagyl).  Has had u/s this pregnancy showing IUP.  Pt reports she had quit smoking cigarettes, but did have 3 today.    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    No leg swelling or pain.  Pt complains of pain to bridge of nose where she struck it with first episode.  Pt also c/o diffuse back pain with movement.  Since having syncope/fall has had severe abdominal cramping like "a period but worse".  No vaginal d/c or bleeding.    Past Medical History  Diagnosis Date  . Irregular heart beat 07/06/11  . UTI (lower urinary tract infection)   . Heart murmur   . PFO (patent foramen ovale)    History reviewed. No pertinent past surgical history. No family history on file. History  Substance Use Topics  . Smoking status: Current Every Day Smoker -- 0.25 packs/day    Types: Cigarettes  . Smokeless tobacco: Never Used  . Alcohol Use: Yes     Comment: occ.   OB History   Grav Para Term Preterm Abortions TAB SAB Ect Mult Living   1              Review of Systems  See History of Present Illness; otherwise all other systems are reviewed and negative   Allergies  Cherry  Home Medications   Current Outpatient Rx  Name  Route  Sig  Dispense  Refill  .  albuterol (PROVENTIL HFA;VENTOLIN HFA) 108 (90 BASE) MCG/ACT inhaler   Inhalation   Inhale 2 puffs into the lungs every 6 (six) hours as needed for wheezing or shortness of breath.         . Prenatal Vit-Fe Fumarate-FA (PRENATAL COMPLETE) 14-0.4 MG TABS   Oral   Take 1 tablet by mouth daily.   60 each   0   . metroNIDAZOLE (FLAGYL) 500 MG tablet   Oral   Take 1 tablet (500 mg total) by mouth 2 (two) times daily. One po bid x 7 days   14 tablet   0    BP 108/67  Pulse 89  Temp(Src) 98.4 F (36.9 C) (Oral)  Resp 18  SpO2 98%  LMP 09/03/2013 Physical Exam  Nursing note and vitals reviewed. Constitutional: She is oriented to person, place, and time. She appears well-developed and well-nourished. No distress.  HENT:  Head:  Normocephalic and atraumatic.  Right Ear: External ear normal.  Left Ear: External ear normal.  Nose: Nose normal.  Mouth/Throat: Oropharynx is clear and moist.  Eyes: Conjunctivae and EOM are normal. Pupils are equal, round, and reactive to light.  Neck: Normal range of motion. Neck supple. No JVD present. No tracheal deviation present. No thyromegaly present.  Cardiovascular: Normal rate, regular rhythm, normal heart sounds and intact distal pulses.  Exam reveals no gallop and no friction rub.   No murmur heard. Pulmonary/Chest: Effort normal and breath sounds normal. No stridor. No respiratory distress. She has no wheezes. She has no rales. She exhibits no tenderness.  Abdominal: Soft. Bowel sounds are normal. She exhibits no distension and no mass. There is tenderness (diffuse abdominal pain). There is no rebound and no guarding.  Musculoskeletal: Normal range of motion. She exhibits no edema and no tenderness.  Lymphadenopathy:    She has no cervical adenopathy.  Neurological: She is alert and oriented to person, place, and time. She has normal reflexes. No cranial nerve deficit. She exhibits normal muscle tone. Coordination normal.  Skin: Skin is warm and dry. No rash noted. No erythema. No pallor.  Psychiatric: She has a normal mood and affect. Her behavior is normal. Judgment and thought content normal.    ED Course  Procedures (including critical care time) Labs Review Labs Reviewed  URINALYSIS, ROUTINE W REFLEX MICROSCOPIC - Abnormal; Notable for the following:    Color, Urine STRAW (*)    Leukocytes, UA TRACE (*)    All other components within normal limits  BASIC METABOLIC PANEL - Abnormal; Notable for the following:    Sodium 135 (*)    Potassium 3.6 (*)    All other components within normal limits  URINE CULTURE  CBC  D-DIMER, QUANTITATIVE  URINE MICROSCOPIC-ADD ON   Imaging Review US Ob Comp Less 14 Wks  11/05/2013   CLINICAL DATA:  Pregnant patient.  Status post  fall.  EXAM: OBSTETRIC <14 WK [place] AND TRANSVAGINAL OB US  TECHNIQUE: Both transabdominal and transvaginal ultrasound examinations were performed for complete evaluation of the gestation as well as the maternal uterus, adnexal regions, and pelvic cul-de-sac. Transvaginal technique was performed to assess early pregnancy.  COMPARISON:  Ultrasound 10/13/2013.  FINDINGS: Intrauterine gestational sac: Visualized/normal in shape.  Yolk sac:  Visualized.  Embryo:  Visualized.  Cardiac Activity: Detected.  Heart Rate:  169 beats per min.  CRL:   2.24  mm   8 w 6 d  US EDC: 06/11/2014.  Maternal uterus/adnexae: 2.9 cm in diameter simple appearing left ovarian cyst is noted. Adnexa are otherwise unremarkable.  IMPRESSION: No acute finding.  Single living intrauterine pregnancy.   Electronically Signed   By: Drusilla Kannerhomas  Dalessio M.D.   On: 11/05/2013 02:25   [place] Ob Transvaginal  11/05/2013   CLINICAL DATA:  Pregnant patient.  Status post fall.  EXAM: OBSTETRIC <14 WK US AND TRANSVAGINAL OB US  TECHNIQUE: Both transabdominal and transvaginal ultrasound examinations were performed for complete evaluation of the gestation as well as the maternal uterus, adnexal regions, and pelvic cul-de-sac. Transvaginal technique was performed to assess early pregnancy.  COMPARISON:  Ultrasound 10/13/2013.  FINDINGS: Intrauterine gestational sac: Visualized/normal in shape.  Yolk sac:  Visualized.  Embryo:  Visualized.  Cardiac Activity: Detected.  Heart Rate:  169 beats per min.  CRL:   2.24  mm   8 w 6 d                  US EDC: 06/11/2014.  Maternal uterus/adnexae: 2.9 cm in diameter simple appearing left ovarian cyst is noted. Adnexa are otherwise unremarkable.  IMPRESSION: No acute finding.  Single living intrauterine pregnancy.   Electronically Signed   By: Drusilla Kannerhomas  Dalessio M.D.   On: 11/05/2013 02:25     EKG Interpretation   Date/Time:  Monday November 05 2013 02:53:53 EDT Ventricular Rate:  74 PR Interval:  156 QRS  Duration: 82 QT Interval:  368 QTC Calculation: 408 R Axis:   73 Text Interpretation:  Sinus rhythm Borderline T abnormalities, anterior  leads No significant change since last tracing Confirmed by Taylormarie Register  MD,  Tahira Olivarez ([id number]) on 11/05/2013 2:57:47 AM      MDM   Final diagnoses:  Syncope  Intrauterine pregnancy    23 year old female with reportedly multiple episodes of syncope.  Today.  She has questionable history of ASD versus PFO.  She is [redacted] weeks pregnant.  She has been smoking tobacco today.  Her workup thus far is unremarkable, but her story is very concerning for a cardiac dysrhythmia.  D-dimer is negative, which is reassuring for PE.  Patient seems unconcerned about her multiple episodes of syncope, and reports to been driving despite these episodes.  I have tried to impress upon her the dangers nature of driving with this unknown episodes.  I will discuss with hospitalist for admission for further workup and possible echo to further delineate her cardiac anatomy.    Avary Mackielga M Jerol Rufener, MD 11/05/13 0300

## 2013-11-05 NOTE — H&P (Signed)
[facility] History and Physical  Veronica Knight NFA:[id number] DOB: June 27, 1991 DOA: 11/04/2013  Referring physician: ER physician. PCP: PROVIDER NOT IN SYSTEM   Chief Complaint: Loss of consciousness.  HPI: Veronica Knight is a 23 y.o. female who is [redacted] weeks pregnant presents to the ER because of loss of consciousness. Patient states last evening around 7 PM while eating at [facility] patient states she may have passed out briefly. Again at her grandmother's house she was found to be coming in and out of consciousness. And she drove herself to the hospital. Patient states she may have passed out while she was waiting at the parking lot. Before the first episode patient felt warm and nauseated but during the next 2 episodes patient was having episodes where she did not have any prodrome. EKG shows normal sinus rhythm with nonspecific ST changes. Patient states she has had a murmur in the heart as a child. Patient presently is nonfocal and sonogram of the abdomen done shows intrauterine pregnancy. Patient: Last 2 days had some nausea vomiting. And also was recently treated for Gardenella Vaginalis with 7 days Flagyl. On my exam patient is not in acute distress and denies any chest pain palpitations headache focal deficits.  Review of Systems: As presented in the history of presenting illness, rest negative.  Past Medical History  Diagnosis Date  . Irregular heart beat 07/06/11  . UTI (lower urinary tract infection)   . Heart murmur   . PFO (patent foramen ovale)    Past Surgical History  Procedure Laterality Date  . No past surgeries     Social History:  reports that she has been smoking Cigarettes.  She has been smoking about 0.25 packs per day. She has never used smokeless tobacco. She reports that she drinks alcohol. She reports that she does not use illicit drugs. Where does patient live home. Can patient participate in ADLs? Yes.  Allergies  Allergen Reactions   . Cherry Anaphylaxis, Swelling and Other (See Comments)    Childhood allergy; reaction unknown, tongue swelling    Family History:  Family History  Problem Relation Age of Onset  . Diabetes Mellitus II Other   . Breast cancer Other   . CAD Neg Hx   . Stroke Neg Hx       Prior to Admission medications   Medication Sig Start Date End Date Taking? Authorizing Provider  albuterol (PROVENTIL HFA;VENTOLIN HFA) 108 (90 BASE) MCG/ACT inhaler Inhale 2 puffs into the lungs every 6 (six) hours as needed for wheezing or shortness of breath.   Yes Historical Provider, MD  Prenatal Vit-Fe Fumarate-FA (PRENATAL COMPLETE) 14-0.4 MG TABS Take 1 tablet by mouth daily. 10/13/13  Yes Peter S Dammen, PA-C  metroNIDAZOLE (FLAGYL) 500 MG tablet Take 1 tablet (500 mg total) by mouth 2 (two) times daily. One po bid x 7 days 10/23/13   Junius Argyle, MD    Physical Exam: Filed Vitals:   11/04/13 2341 11/05/13 0010 11/05/13 0011 11/05/13 0012  BP: 118/72 94/51 105/70 108/67  Pulse: 82 77 81 89  Temp: 98.4 F (36.9 C)     TempSrc: Oral     Resp: 18     SpO2: 98%        General:  Well-developed well-nourished.  Eyes: Anicteric no pallor. No nystagmus.  ENT: No discharge from the ears eyes nose mouth.  Neck: No mass felt.  Cardiovascular: S1-S2 heard.  Respiratory: No rhonchi or crepitations.  Abdomen: Soft nontender bowel  sounds present. No guarding rigidity.  Skin: No rash.  Musculoskeletal: No edema.  Psychiatric: Appears normal.  Neurologic: Alert awake oriented to time place and person. Moves all extremities.  Labs on Admission:  Basic Metabolic Panel:  Recent Labs Lab 11/04/13 2358  NA 135*  K 3.6*  CL 97  CO2 22  GLUCOSE 88  BUN 9  CREATININE 0.53  CALCIUM 9.4   Liver Function Tests: No results found for this basename: AST, ALT, ALKPHOS, BILITOT, PROT, ALBUMIN,  in the last 168 hours No results found for this basename: LIPASE, AMYLASE,  in the last 168 hours No  results found for this basename: AMMONIA,  in the last 168 hours CBC:  Recent Labs Lab 11/04/13 2358  WBC 10.4  HGB 13.8  HCT 39.2  MCV 87.1  PLT 254   Cardiac Enzymes: No results found for this basename: CKTOTAL, CKMB, CKMBINDEX, TROPONINI,  in the last 168 hours  BNP (last 3 results) No results found for this basename: PROBNP,  in the last 8760 hours CBG: No results found for this basename: GLUCAP,  in the last 168 hours  Radiological Exams on Admission: US Ob Comp Less 14 Wks  11/05/2013   CLINICAL DATA:  Pregnant patient.  Status post fall.  EXAM: OBSTETRIC <14 WK [place] AND TRANSVAGINAL OB US  TECHNIQUE: Both transabdominal and transvaginal ultrasound examinations were performed for complete evaluation of the gestation as well as the maternal uterus, adnexal regions, and pelvic cul-de-sac. Transvaginal technique was performed to assess early pregnancy.  COMPARISON:  Ultrasound 10/13/2013.  FINDINGS: Intrauterine gestational sac: Visualized/normal in shape.  Yolk sac:  Visualized.  Embryo:  Visualized.  Cardiac Activity: Detected.  Heart Rate:  169 beats per min.  CRL:   2.24  mm   8 w 6 d                  [place] EDC: 06/11/2014.  Maternal uterus/adnexae: 2.9 cm in diameter simple appearing left ovarian cyst is noted. Adnexa are otherwise unremarkable.  IMPRESSION: No acute finding.  Single living intrauterine pregnancy.   Electronically Signed   By: Drusilla Kanner M.D.   On: 11/05/2013 02:25   US Ob Transvaginal  11/05/2013   CLINICAL DATA:  Pregnant patient.  Status post fall.  EXAM: OBSTETRIC <14 WK [place] AND TRANSVAGINAL OB US  TECHNIQUE: Both transabdominal and transvaginal ultrasound examinations were performed for complete evaluation of the gestation as well as the maternal uterus, adnexal regions, and pelvic cul-de-sac. Transvaginal technique was performed to assess early pregnancy.  COMPARISON:  Ultrasound 10/13/2013.  FINDINGS: Intrauterine gestational sac: Visualized/normal in shape.   Yolk sac:  Visualized.  Embryo:  Visualized.  Cardiac Activity: Detected.  Heart Rate:  169 beats per min.  CRL:   2.24  mm   8 w 6 d                  [place] EDC: 06/11/2014.  Maternal uterus/adnexae: 2.9 cm in diameter simple appearing left ovarian cyst is noted. Adnexa are otherwise unremarkable.  IMPRESSION: No acute finding.  Single living intrauterine pregnancy.   Electronically Signed   By: Drusilla Kanner M.D.   On: 11/05/2013 02:25    EKG: Independently reviewed. Normal sinus rhythm with nonspecific ST-T changes.  Assessment/Plan Principal Problem:   Syncope Active Problems:   Pregnancy   1. Syncope - cause not clear. Closely monitor in telemetry for any arrhythmias. Given history of cardiac markers as a child check 2-D echo. Check  orthostatics. Consult cardiology in a.m. 2. 9 weeks pregnancy. 3. Tobacco abuse - advised to quit smoking.    Code Status: Full code.  Family Communication: None.  Disposition Plan: Admit for observation.    Arnie Clingenpeel N. [facility] Pager [phone].  If 7PM-7AM, please contact night-coverage [email or web address] Password [facility] 11/05/2013, 3:33 AM

## 2013-11-05 NOTE — Progress Notes (Signed)
   She did this morning for recurrent syncope. Stable on telemetry, troponin EKG stable. Currently chest pain-free. We'll monitor orthostatics, EEG ordered. Seen by cardiology. Echo pending. Continue to monitor. She has been counseled not to drive for 6 months in the event of recent multiple syncopal episodes. Mother present bedside agrees.   She is [redacted] weeks pregnant. I have requested OB physician on call Dr. Stefano GaulStringer to see the patient in consultation.

## 2013-11-06 DIAGNOSIS — R55 Syncope and collapse: Secondary | ICD-10-CM

## 2013-11-06 LAB — URINE CULTURE
COLONY COUNT: NO GROWTH
Culture: NO GROWTH

## 2013-11-06 MED ORDER — POTASSIUM CHLORIDE CRYS ER 20 MEQ PO TBCR
40.0000 meq | EXTENDED_RELEASE_TABLET | Freq: Once | ORAL | Status: DC
  Filled 2013-11-06: qty 2

## 2013-11-06 MED ORDER — DOXYLAMINE-PYRIDOXINE 10-10 MG PO TBEC: 10.0000 mg | DELAYED_RELEASE_TABLET | Freq: Two times a day (BID) | ORAL | Status: DC | PRN

## 2013-11-06 MED ORDER — ONDANSETRON HCL 4 MG PO TABS: 8.0000 mg | ORAL_TABLET | Freq: Three times a day (TID) | ORAL | Status: DC | PRN

## 2013-11-06 MED ORDER — PYRIDOXINE HCL 25 MG PO TABS
12.5000 mg | ORAL_TABLET | Freq: Two times a day (BID) | ORAL | Status: DC | PRN
  Filled 2013-11-06: qty 1

## 2013-11-06 MED ORDER — DOXYLAMINE SUCCINATE (SLEEP) 25 MG PO TABS
12.5000 mg | ORAL_TABLET | Freq: Two times a day (BID) | ORAL | Status: DC | PRN
  Filled 2013-11-06: qty 1

## 2013-11-06 NOTE — Progress Notes (Signed)
Patient wants to check out against medical advise. RN educated patient about contraindications for signing out AMA. Patient still wanted to check out AMA. MD was notified. MD wanted patient to wait until neurology came to see patient. Patient was made aware, but still wanted to check out. IV taken out and telemetry off. Patient signed AMA papers and was advised to follow up with cardiology, neurology, OB, and primary doctors. Numbers and contacts of follow up doctors given to patient. Patient stated, "I ain't going, I ain't following up". Patient was also advised not to drive per Dr. Thedore MinsSingh for at least six months until she follows up with neurology. Again, patient stated "I'm not following up". RN reinforced the contraindications of driving. Patient walked out with her belongings.

## 2013-11-06 NOTE — Progress Notes (Signed)
Patient ID: Colleen Vanloan Fox-McWhite, female   DOB: [birth year], 24 y.o.   MRN: [id number]   SUBJECTIVE: No further lightheadedness/syncope.  Telemetry overnight with no events.   Scheduled Meds: . enoxaparin (LOVENOX) injection  40 mg Subcutaneous Q24H  . potassium chloride  40 mEq Oral Once  . prenatal multivitamin  1 tablet Oral Daily  . sodium chloride  3 mL Intravenous Q12H  . sodium chloride  3 mL Intravenous Q12H   Continuous Infusions:  PRN Meds:.acetaminophen, albuterol, doxylamine (Sleep), ondansetron (ZOFRAN) IV, ondansetron, vitamin B-6    Filed Vitals:   11/05/13 2124 11/06/13 0621 11/06/13 0623 11/06/13 0625  BP: 112/72 105/68 116/55 102/74  Pulse: 95 67 86 106  Temp:  98.7 F (37.1 C)    TempSrc:  Oral    Resp:  18    Height:      Weight:      SpO2:  100%      Intake/Output Summary (Last 24 hours) at 11/06/13 0827 Last data filed at 11/06/13 4098  Gross per 24 hour  Intake    360 ml  Output     13 ml  Net    347 ml    LABS: Basic Metabolic Panel:  Recent Labs  11/91/47 2358 11/05/13 0520  NA 135* 136*  K 3.6* 3.5*  CL 97 102  CO2 22 22  GLUCOSE 88 90  BUN 9 6  CREATININE 0.53 0.42*  CALCIUM 9.4 8.9  MG  --  1.6   Liver Function Tests:  Recent Labs  11/05/13 0520  AST 15  ALT 19  ALKPHOS 34*  BILITOT 0.2*  PROT 6.0  ALBUMIN 3.2*   No results found for this basename: LIPASE, AMYLASE,  in the last 72 hours CBC:  Recent Labs  11/04/13 2358 11/05/13 0520  WBC 10.4 9.9  NEUTROABS  --  5.1  HGB 13.8 12.6  HCT 39.2 36.3  MCV 87.1 86.6  PLT 254 229   Cardiac Enzymes:  Recent Labs  11/05/13 0520  TROPONINI <0.30   BNP: No components found with this basename: POCBNP,  D-Dimer:  Recent Labs  11/05/13 0132  DDIMER 0.28   Hemoglobin A1C: No results found for this basename: HGBA1C,  in the last 72 hours Fasting Lipid Panel: No results found for this basename: CHOL, HDL, LDLCALC, TRIG, CHOLHDL, LDLDIRECT,  in the last 72  hours Thyroid Function Tests:  Recent Labs  11/05/13 0520  TSH 0.432   Anemia Panel: No results found for this basename: VITAMINB12, FOLATE, FERRITIN, TIBC, IRON, RETICCTPCT,  in the last 72 hours  RADIOLOGY: US Ob Comp Less 14 Wks  11/05/2013   CLINICAL DATA:  Pregnant patient.  Status post fall.  EXAM: OBSTETRIC <14 WK [place] AND TRANSVAGINAL OB US  TECHNIQUE: Both transabdominal and transvaginal ultrasound examinations were performed for complete evaluation of the gestation as well as the maternal uterus, adnexal regions, and pelvic cul-de-sac. Transvaginal technique was performed to assess early pregnancy.  COMPARISON:  Ultrasound 10/13/2013.  FINDINGS: Intrauterine gestational sac: Visualized/normal in shape.  Yolk sac:  Visualized.  Embryo:  Visualized.  Cardiac Activity: Detected.  Heart Rate:  169 beats per min.  CRL:   2.24  mm   8 w 6 d                  [place] EDC: 06/11/2014.  Maternal uterus/adnexae: 2.9 cm in diameter simple appearing left ovarian cyst is noted. Adnexa are otherwise unremarkable.  IMPRESSION: No acute finding.  Single living intrauterine pregnancy.   Electronically Signed   By: Drusilla Kanner M.D.   On: 11/05/2013 02:25   US Ob Comp Less 14 Wks  10/13/2013   CLINICAL DATA:  Pelvic pain.  EXAM: OBSTETRIC <14 WK [place] AND TRANSVAGINAL OB US  TECHNIQUE: Both transabdominal and transvaginal ultrasound examinations were performed for complete evaluation of the gestation as well as the maternal uterus, adnexal regions, and pelvic cul-de-sac. Transvaginal technique was performed to assess early pregnancy.  COMPARISON:  CT of the abdomen and pelvis performed 01/08/2011  FINDINGS: Intrauterine gestational sac: Visualized/normal in shape.  Yolk sac:  Yes  Embryo:  Yes  Cardiac Activity: Yes  Heart Rate:  Remains too small to fully characterize.  CRL:   2.1  mm   5 w 5d                  [place] EDC: 06/10/2014  Maternal uterus/adnexae: No subchorionic hemorrhage is noted. The uterus is  unremarkable in appearance.  The right ovary is not visualized. The left ovary measures 4.2 x 3.1 x 3.2 cm. A mildly complex left ovarian cyst, measuring 2.6 cm in size, may reflect an organizing hemorrhagic cyst, given its lace-like pattern.  No free fluid is seen within the pelvic cul-de-sac.  IMPRESSION: 1. Single live intrauterine pregnancy noted, with a crown-rump length of 2 mm, corresponding to a gestational age of [redacted] weeks 5 days. This matches the gestational age by LMP, reflecting an estimated date of delivery of June 10, 2014. 2. 2.6 cm complex left ovarian cyst may reflect an organizing hemorrhagic cyst.   Electronically Signed   By: Roanna Raider M.D.   On: 10/13/2013 03:59   US Ob Transvaginal  11/05/2013   CLINICAL DATA:  Pregnant patient.  Status post fall.  EXAM: OBSTETRIC <14 WK [place] AND TRANSVAGINAL OB US  TECHNIQUE: Both transabdominal and transvaginal ultrasound examinations were performed for complete evaluation of the gestation as well as the maternal uterus, adnexal regions, and pelvic cul-de-sac. Transvaginal technique was performed to assess early pregnancy.  COMPARISON:  Ultrasound 10/13/2013.  FINDINGS: Intrauterine gestational sac: Visualized/normal in shape.  Yolk sac:  Visualized.  Embryo:  Visualized.  Cardiac Activity: Detected.  Heart Rate:  169 beats per min.  CRL:   2.24  mm   8 w 6 d                  [place] EDC: 06/11/2014.  Maternal uterus/adnexae: 2.9 cm in diameter simple appearing left ovarian cyst is noted. Adnexa are otherwise unremarkable.  IMPRESSION: No acute finding.  Single living intrauterine pregnancy.   Electronically Signed   By: Drusilla Kanner M.D.   On: 11/05/2013 02:25   US Ob Transvaginal  10/13/2013   CLINICAL DATA:  Pelvic pain.  EXAM: OBSTETRIC <14 WK [place] AND TRANSVAGINAL OB US  TECHNIQUE: Both transabdominal and transvaginal ultrasound examinations were performed for complete evaluation of the gestation as well as the maternal uterus, adnexal regions,  and pelvic cul-de-sac. Transvaginal technique was performed to assess early pregnancy.  COMPARISON:  CT of the abdomen and pelvis performed 01/08/2011  FINDINGS: Intrauterine gestational sac: Visualized/normal in shape.  Yolk sac:  Yes  Embryo:  Yes  Cardiac Activity: Yes  Heart Rate:  Remains too small to fully characterize.  CRL:   2.1  mm   5 w 5d                  [place] EDC: 06/10/2014  Maternal  uterus/adnexae: No subchorionic hemorrhage is noted. The uterus is unremarkable in appearance.  The right ovary is not visualized. The left ovary measures 4.2 x 3.1 x 3.2 cm. A mildly complex left ovarian cyst, measuring 2.6 cm in size, may reflect an organizing hemorrhagic cyst, given its lace-like pattern.  No free fluid is seen within the pelvic cul-de-sac.  IMPRESSION: 1. Single live intrauterine pregnancy noted, with a crown-rump length of 2 mm, corresponding to a gestational age of [redacted] weeks 5 days. This matches the gestational age by LMP, reflecting an estimated date of delivery of June 10, 2014. 2. 2.6 cm complex left ovarian cyst may reflect an organizing hemorrhagic cyst.   Electronically Signed   By: Roanna Raider M.D.   On: 10/13/2013 03:59    PHYSICAL EXAM General: NAD Neck: No JVD, no thyromegaly or thyroid nodule.  Lungs: Clear to auscultation bilaterally with normal respiratory effort. CV: Nondisplaced PMI.  Heart regular S1/S2, no S3/S4, no murmur.  No peripheral edema.   Abdomen: Gravid, soft, nontender, no hepatosplenomegaly, no distention.  Neurologic: Alert and oriented x 3.  Psych: Normal affect. Extremities: No clubbing or cyanosis.   TELEMETRY: Reviewed telemetry pt in NSR, occasional sinus arrhythmia  ASSESSMENT AND PLAN: Pregnant 23 yo presented with syncope/presyncope x 3 in the setting of nausea and feeling of warmth. She says that when she was born, she was told that she has a small hole in her heart.  Work up here so far has been unremarkable.  Echo showed EF 50-55%, no  significant abnormalities.  ECG was unremarkable.  Telemetry monitoring has shown no significant arrhythmias.  It is quite possible that this was a vagal event in the setting of her morning sickness.  I would agree with no driving x 24 hours and will arrange to set her up with a 30 day monitor to make sure we are not dealing with an arrhythmia.   Marca Ancona 11/06/2013 8:30 AM

## 2013-11-06 NOTE — Discharge Instructions (Signed)
Follow with Primary MD , OB, Neurology and Cardiologist in 7 days   Get CBC, CMP, checked 7 days by Primary MD and again as instructed by your Primary MD. Get a 2 view Chest X ray done next visit if you had Pneumonia of Lung problems at the Hospital.   Activity: As tolerated with Full fall precautions use walker/cane & assistance as needed   Disposition Home     Diet: Heart Healthy    For Heart failure patients - Check your Weight same time everyday, if you gain over 2 pounds, or you develop in leg swelling, experience more shortness of breath or chest pain, call your Primary MD immediately. Follow Cardiac Low Salt Diet and 1.8 lit/day fluid restriction.   On your next visit with her primary care physician please Get Medicines reviewed and adjusted.  Please request your Prim.MD to go over all Hospital Tests and Procedure/Radiological results at the follow up, please get all Hospital records sent to your Prim MD by signing hospital release before you go home.   If you experience worsening of your admission symptoms, develop shortness of breath, life threatening emergency, suicidal or homicidal thoughts you must seek medical attention immediately by calling 911 or calling your MD immediately  if symptoms less severe.  You Must read complete instructions/literature along with all the possible adverse reactions/side effects for all the Medicines you take and that have been prescribed to you. Take any new Medicines after you have completely understood and accpet all the possible adverse reactions/side effects.   Do not drive and provide baby sitting services until you have seen by a Neurologist and advised to do so again.  Do not drive when taking Pain medications.    Do not take more than prescribed Pain, Sleep and Anxiety Medications  Special Instructions: If you have smoked or chewed Tobacco  in the last 2 yrs please stop smoking, stop any regular Alcohol  and or any Recreational drug  use.  Wear Seat belts while driving.   Please note  You were cared for by a hospitalist during your hospital stay. If you have any questions about your discharge medications or the care you received while you were in the hospital after you are discharged, you can call the unit and asked to speak with the hospitalist on call if the hospitalist that took care of you is not available. Once you are discharged, your primary care physician will handle any further medical issues. Please note that NO REFILLS for any discharge medications will be authorized once you are discharged, as it is imperative that you return to your primary care physician (or establish a relationship with a primary care physician if you do not have one) for your aftercare needs so that they can reassess your need for medications and monitor your lab values.

## 2013-11-06 NOTE — Procedures (Addendum)
ELECTROENCEPHALOGRAM REPORT   Patient: Veronica Knight       Room #: 1441 EEG No. ID: [id number] Age: 23 y.o.        Sex: female Referring Physician: Thedore MinsSingh Report Date:  11/05/2013        Interpreting Physician: Thana FarrEYNOLDS,Divonte Senger D  History: Veronica Knight is an [age] y.o. female with recurrent episodes of syncope  Medications:  Scheduled: . enoxaparin (LOVENOX) injection  40 mg Subcutaneous Q24H  . potassium chloride  40 mEq Oral Once  . prenatal multivitamin  1 tablet Oral Daily  . sodium chloride  3 mL Intravenous Q12H  . sodium chloride  3 mL Intravenous Q12H    Conditions of Recording:  This is a 16 channel EEG carried out with the patient in the awake, drowsy and asleep states.  Description:  The waking background activity consists of a low voltage, symmetrical, fairly well organized, 10 Hz alpha activity, seen from the parieto-occipital and posterior temporal regions.  Low voltage fast activity, poorly organized, is seen anteriorly and is at times superimposed on more posterior regions.  A mixture of theta and alpha rhythms are seen from the central and temporal regions. The patient drowses with slowing to irregular, low voltage theta and beta activity.   The patient goes in to a light sleep with symmetrical sleep spindles, vertex central sharp transients and irregular slow activity. Hyperventilation and intermittent photic stimulation were not performed.  IMPRESSION: This is a normal electroencephalogram   Thana FarrLeslie Gregg Winchell, MD [facility] [phone] 11/06/2013, 7:36 PM

## 2013-11-06 NOTE — Progress Notes (Addendum)
VASCULAR LAB PRELIMINARY  PRELIMINARY  PRELIMINARY  PRELIMINARY  Carotid duplex completed.    Preliminary report:  Bilateral:  1-39% ICA stenosis lowest end of range.  Vertebral artery flow is antegrade.     Jilene Spohr, RVS 11/06/2013, 8:27 AM

## 2013-11-06 NOTE — Discharge Summary (Signed)
Patient at this time expresses desire to leave the Hospital immidiately, patient has been warned that this is not Medically advisable at this time, and can result in Medical complications like Death and Disability to her and to her fetus, patient understands and accepts the risks involved and assumes full responsibilty of this decision.    She was admitted for Syncope, ? Vagal, tele, Echo stable, seen by cardiology, cleared for inpatient discharge they will set up for outpatient event monitor.  EEG results pending   Carotid  duplex pending   Neuro evaluation pending, neurology was consulted.   Patient  does not want to stay in the hospital despite extensive warning by me. Will sign out AMA.     DC instructions  Follow with Primary MD , OB, Neurology and Cardiologist in 7 days   Get CBC, CMP, checked 7 days by Primary MD and again as instructed by your Primary MD. Get a 2 view Chest X ray done next visit if you had Pneumonia of Lung problems at the Hospital.   Activity: As tolerated with Full fall precautions use walker/cane & assistance as needed   Disposition Home     Diet: Heart Healthy    For Heart failure patients - Check your Weight same time everyday, if you gain over 2 pounds, or you develop in leg swelling, experience more shortness of breath or chest pain, call your Primary MD immediately. Follow Cardiac Low Salt Diet and 1.8 lit/day fluid restriction.   On your next visit with her primary care physician please Get Medicines reviewed and adjusted.  Please request your Prim.MD to go over all Hospital Tests and Procedure/Radiological results at the follow up, please get all Hospital records sent to your Prim MD by signing hospital release before you go home.   If you experience worsening of your admission symptoms, develop shortness of breath, life threatening emergency, suicidal or homicidal thoughts you must seek medical attention immediately by calling 911 or  calling your MD immediately  if symptoms less severe.  You Must read complete instructions/literature along with all the possible adverse reactions/side effects for all the Medicines you take and that have been prescribed to you. Take any new Medicines after you have completely understood and accpet all the possible adverse reactions/side effects.   Do not drive and provide baby sitting services until you have seen by a Neurologist and advised to do so again.  Do not drive when taking Pain medications.    Do not take more than prescribed Pain, Sleep and Anxiety Medications  Special Instructions: If you have smoked or chewed Tobacco  in the last 2 yrs please stop smoking, stop any regular Alcohol  and or any Recreational drug use.  Wear Seat belts while driving.   Please note  You were cared for by a hospitalist during your hospital stay. If you have any questions about your discharge medications or the care you received while you were in the hospital after you are discharged, you can call the unit and asked to speak with the hospitalist on call if the hospitalist that took care of you is not available. Once you are discharged, your primary care physician will handle any further medical issues. Please note that NO REFILLS for any discharge medications will be authorized once you are discharged, as it is imperative that you return to your primary care physician (or establish a relationship with a primary care physician if you do not have one) for your aftercare needs so  that they can reassess your need for medications and monitor your lab values.    Follow-up Information   Follow up with [facility]    . Schedule an appointment as soon as possible for a visit in 1 week.   Contact information:   [address] [place] [address] [phone]      Follow up with Gates RiggSETHI,PRAMODKUMAR P, MD. Schedule an appointment as soon as possible for a visit in 1 week.    Specialties:  Neurology, Radiology   Contact information:   [address] [place] [address] [phone]       Follow up with Marca Anconaalton McLean, MD. Schedule an appointment as soon as possible for a visit in 1 week.   Specialty:  Cardiology   Contact information:   [address] [place] [address] [phone]       Follow up with Janine LimboSTRINGER,ARTHUR V, MD. Schedule an appointment as soon as possible for a visit in 1 week.   Specialty:  Obstetrics and Gynecology   Contact information:   [address] [place] [address] [phone]

## 2014-01-17 ENCOUNTER — Encounter (HOSPITAL_COMMUNITY): Payer: Self-pay | Admitting: Emergency Medicine

## 2014-01-17 ENCOUNTER — Emergency Department (HOSPITAL_COMMUNITY)
Admission: EM | Admit: 2014-01-17 | Discharge: 2014-01-17 | Disposition: A | Discharge status: Home / Self Care | Payer: Medicaid Other | Attending: Emergency Medicine | Admitting: Emergency Medicine

## 2014-01-17 DIAGNOSIS — Q211 Atrial septal defect: Secondary | ICD-10-CM | POA: Insufficient documentation

## 2014-01-17 DIAGNOSIS — Z792 Long term (current) use of antibiotics: Secondary | ICD-10-CM | POA: Insufficient documentation

## 2014-01-17 DIAGNOSIS — Z87440 Personal history of urinary (tract) infections: Secondary | ICD-10-CM | POA: Insufficient documentation

## 2014-01-17 DIAGNOSIS — Z3202 Encounter for pregnancy test, result negative: Secondary | ICD-10-CM | POA: Insufficient documentation

## 2014-01-17 DIAGNOSIS — R011 Cardiac murmur, unspecified: Secondary | ICD-10-CM | POA: Insufficient documentation

## 2014-01-17 DIAGNOSIS — N3091 Cystitis, unspecified with hematuria: Secondary | ICD-10-CM

## 2014-01-17 DIAGNOSIS — Q2111 Secundum atrial septal defect: Secondary | ICD-10-CM | POA: Insufficient documentation

## 2014-01-17 DIAGNOSIS — Z8679 Personal history of other diseases of the circulatory system: Secondary | ICD-10-CM | POA: Insufficient documentation

## 2014-01-17 DIAGNOSIS — F17200 Nicotine dependence, unspecified, uncomplicated: Secondary | ICD-10-CM | POA: Insufficient documentation

## 2014-01-17 DIAGNOSIS — N3090 Cystitis, unspecified without hematuria: Secondary | ICD-10-CM | POA: Insufficient documentation

## 2014-01-17 HISTORY — DX: Complete or unspecified spontaneous abortion without complication: O03.9

## 2014-01-17 HISTORY — DX: Reserved for inherently not codable concepts without codable children: IMO0001

## 2014-01-17 LAB — URINALYSIS, ROUTINE W REFLEX MICROSCOPIC
GLUCOSE, UA: NEGATIVE mg/dL
Ketones, ur: 15 mg/dL — AB
Nitrite: POSITIVE — AB
PH: 5.5 (ref 5.0–8.0)
Protein, ur: 30 mg/dL — AB
Specific Gravity, Urine: 1.034 — ABNORMAL HIGH (ref 1.005–1.030)
Urobilinogen, UA: 1 mg/dL (ref 0.0–1.0)

## 2014-01-17 LAB — URINE MICROSCOPIC-ADD ON

## 2014-01-17 LAB — PREGNANCY, URINE: Preg Test, Ur: NEGATIVE

## 2014-01-17 MED ORDER — NITROFURANTOIN MONOHYD MACRO 100 MG PO CAPS
100.0000 mg | ORAL_CAPSULE | Freq: Once | ORAL | Status: AC
  Administered 2014-01-17: 100 mg via ORAL
  Filled 2014-01-17: qty 1

## 2014-01-17 MED ORDER — NITROFURANTOIN MONOHYD MACRO 100 MG PO CAPS: 100.0000 mg | ORAL_CAPSULE | Freq: Two times a day (BID) | ORAL | Status: DC

## 2014-01-17 NOTE — ED Notes (Signed)
Pt. reports low back pain / hematuria onset today , denies dysuria / no fever or chills. Pt. stated abortion last month.

## 2014-01-17 NOTE — ED Notes (Signed)
Called James in pharmacy for medication needed prior to discharge.

## 2014-01-17 NOTE — ED Provider Notes (Signed)
CSN: [id number]     Arrival date & time 01/17/14  0016 History   First MD Initiated Contact with Patient 01/17/14 0451     Chief Complaint  Patient presents with  . Back Pain     (Consider location/radiation/quality/duration/timing/severity/associated sxs/prior Treatment) HPI This is a 23 year old female with a history of urinary tract infections. She is here with about a one-week history of low back pain and hematuria. She states the symptoms are like previous urinary tract infections except she's not had hematuria in the past. The back pain is mild to moderate. She denies dysuria, fever, chills, nausea, vomiting, vaginal bleeding or vaginal discharge. She states she is constipated and she is having suprapubic pain.  Past Medical History  Diagnosis Date  . Irregular heart beat 07/06/11  . UTI (lower urinary tract infection)   . Heart murmur   . PFO (patent foramen ovale)   . Abortion    Past Surgical History  Procedure Laterality Date  . No past surgeries     Family History  Problem Relation Age of Onset  . Diabetes Mellitus II Other   . Breast cancer Other   . CAD Neg Hx   . Stroke Neg Hx    History  Substance Use Topics  . Smoking status: Current Some Day Smoker -- 0.25 packs/day    Types: Cigarettes  . Smokeless tobacco: Never Used  . Alcohol Use: No     Comment: not drinking since she found out she was pregnant   OB History   Grav Para Term Preterm Abortions TAB SAB Ect Mult Living   1              Review of Systems  All other systems reviewed and are negative.   Allergies  Cherry  Home Medications   Prior to Admission medications   Medication Sig Start Date End Date Taking? Authorizing Provider  metroNIDAZOLE (FLAGYL) 500 MG tablet Take 500 mg by mouth 2 (two) times daily.   Yes Historical Provider, MD  Phenazopyridine HCl (AZO TABS PO) Take 2 tablets by mouth 3 (three) times daily as needed (pain).   Yes Historical Provider, MD  albuterol (PROVENTIL  HFA;VENTOLIN HFA) 108 (90 BASE) MCG/ACT inhaler Inhale 2 puffs into the lungs every 6 (six) hours as needed for wheezing or shortness of breath.    Historical Provider, MD   BP 114/85  Pulse 60  Temp(Src) 98.4 F (36.9 C) (Oral)  Resp 20  SpO2 100%  LMP 01/05/2014  Breastfeeding? Unknown  Physical Exam General: Well-developed, well-nourished female in no acute distress; appearance consistent with age of record HENT: normocephalic; atraumatic Eyes: pupils equal, round and reactive to light; extraocular muscles intact Neck: supple Heart: regular rate and rhythm Lungs: clear to auscultation bilaterally Abdomen: soft; nondistended; suprapubic tenderness; no masses or hepatosplenomegaly; bowel sounds present GU: No CVA Extremities: No deformity; full range of motion; pulses normal Neurologic: Awake, alert and oriented; motor function intact in all extremities and symmetric; no facial droop Skin: Warm and dry Psychiatric: Normal mood and affect    ED Course  Procedures (including critical care time)  MDM   Nursing notes and vitals signs, including pulse oximetry, reviewed.  Summary of this visit's results, reviewed by myself:  Labs:  Results for orders placed during the hospital encounter of 01/17/14 (from the past 24 hour(s))  URINALYSIS, ROUTINE W REFLEX MICROSCOPIC     Status: Abnormal   Collection Time    01/17/14 12:28 AM  Result Value Ref Range   Color, Urine ORANGE (*) YELLOW   APPearance CLOUDY (*) CLEAR   Specific Gravity, Urine 1.034 (*) 1.005 - 1.030   pH 5.5  5.0 - 8.0   Glucose, UA NEGATIVE  NEGATIVE mg/dL   Hgb urine dipstick LARGE (*) NEGATIVE   Bilirubin Urine SMALL (*) NEGATIVE   Ketones, ur 15 (*) NEGATIVE mg/dL   Protein, ur 30 (*) NEGATIVE mg/dL   Urobilinogen, UA 1.0  0.0 - 1.0 mg/dL   Nitrite POSITIVE (*) NEGATIVE   Leukocytes, UA SMALL (*) NEGATIVE  PREGNANCY, URINE     Status: None   Collection Time    01/17/14 12:28 AM      Result Value  Ref Range   Preg Test, Ur NEGATIVE  NEGATIVE  URINE MICROSCOPIC-ADD ON     Status: Abnormal   Collection Time    01/17/14 12:28 AM      Result Value Ref Range   Squamous Epithelial / LPF RARE  RARE   WBC, UA 7-10  <3 WBC/hpf   RBC / HPF TOO NUMEROUS TO COUNT  <3 RBC/hpf   Bacteria, UA FEW (*) RARE   Urine-Other MUCOUS PRESENT          Hanley Seamen, MD 01/17/14 [phone]

## 2014-01-17 NOTE — Discharge Instructions (Signed)
Urinary Tract Infection  Urinary tract infections (UTIs) can develop anywhere along your urinary tract. Your urinary tract is your body's drainage system for removing wastes and extra water. Your urinary tract includes two kidneys, two ureters, a bladder, and a urethra. Your kidneys are a pair of bean-shaped organs. Each kidney is about the size of your fist. They are located below your ribs, one on each side of your spine.  CAUSES  Infections are caused by microbes, which are microscopic organisms, including fungi, viruses, and bacteria. These organisms are so small that they can only be seen through a microscope. Bacteria are the microbes that most commonly cause UTIs.  SYMPTOMS   Symptoms of UTIs may vary by age and gender of the patient and by the location of the infection. Symptoms in young women typically include a frequent and intense urge to urinate and a painful, burning feeling in the bladder or urethra during urination. Older women and men are more likely to be tired, shaky, and weak and have muscle aches and abdominal pain. A fever may mean the infection is in your kidneys. Other symptoms of a kidney infection include pain in your back or sides below the ribs, nausea, and vomiting.  DIAGNOSIS  To diagnose a UTI, your caregiver will ask you about your symptoms. Your caregiver also will ask to provide a urine sample. The urine sample will be tested for bacteria and white blood cells. White blood cells are made by your body to help fight infection.  TREATMENT   Typically, UTIs can be treated with medication. Because most UTIs are caused by a bacterial infection, they usually can be treated with the use of antibiotics. The choice of antibiotic and length of treatment depend on your symptoms and the type of bacteria causing your infection.  HOME CARE INSTRUCTIONS   If you were prescribed antibiotics, take them exactly as your caregiver instructs you. Finish the medication even if you feel better after you  have only taken some of the medication.   Drink enough water and fluids to keep your urine clear or pale yellow.   Avoid caffeine, tea, and carbonated beverages. They tend to irritate your bladder.   Empty your bladder often. Avoid holding urine for long periods of time.   Empty your bladder before and after sexual intercourse.   After a bowel movement, women should cleanse from front to back. Use each tissue only once.  SEEK MEDICAL CARE IF:    You have back pain.   You develop a fever.   Your symptoms do not begin to resolve within 3 days.  SEEK IMMEDIATE MEDICAL CARE IF:    You have severe back pain or lower abdominal pain.   You develop chills.   You have nausea or vomiting.   You have continued burning or discomfort with urination.  MAKE SURE YOU:    Understand these instructions.   Will watch your condition.   Will get help right away if you are not doing well or get worse.  Document Released: 05/19/2005 Document Revised: 02/08/2012 Document Reviewed: 09/17/2011  [employer] Patient Information 2014 [employer].

## 2014-05-28 ENCOUNTER — Encounter (HOSPITAL_COMMUNITY): Payer: Self-pay | Admitting: Emergency Medicine

## 2014-05-28 ENCOUNTER — Emergency Department (HOSPITAL_COMMUNITY)
Admission: EM | Admit: 2014-05-28 | Discharge: 2014-05-28 | Disposition: A | Discharge status: Home / Self Care | Payer: Medicaid Other | Source: Home / Self Care | Attending: Emergency Medicine | Admitting: Emergency Medicine

## 2014-05-28 DIAGNOSIS — E860 Dehydration: Secondary | ICD-10-CM

## 2014-05-28 DIAGNOSIS — A084 Viral intestinal infection, unspecified: Secondary | ICD-10-CM

## 2014-05-28 LAB — POCT PREGNANCY, URINE: Preg Test, Ur: NEGATIVE

## 2014-05-28 LAB — CBC WITH DIFFERENTIAL/PLATELET
Basophils Absolute: 0 10*3/uL (ref 0.0–0.1)
Basophils Relative: 0 % (ref 0–1)
EOS PCT: 2 % (ref 0–5)
Eosinophils Absolute: 0.1 10*3/uL (ref 0.0–0.7)
HEMATOCRIT: 40.7 % (ref 36.0–46.0)
Hemoglobin: 14.2 g/dL (ref 12.0–15.0)
Lymphocytes Relative: 44 % (ref 12–46)
Lymphs Abs: 2.9 10*3/uL (ref 0.7–4.0)
MCH: 31.8 pg (ref 26.0–34.0)
MCHC: 34.9 g/dL (ref 30.0–36.0)
MCV: 91.3 fL (ref 78.0–100.0)
Monocytes Absolute: 0.6 10*3/uL (ref 0.1–1.0)
Monocytes Relative: 8 % (ref 3–12)
Neutro Abs: 3.1 10*3/uL (ref 1.7–7.7)
Neutrophils Relative %: 46 % (ref 43–77)
PLATELETS: 260 10*3/uL (ref 150–400)
RBC: 4.46 MIL/uL (ref 3.87–5.11)
RDW: 14.6 % (ref 11.5–15.5)
WBC: 6.8 10*3/uL (ref 4.0–10.5)

## 2014-05-28 LAB — POCT I-STAT, CHEM 8
BUN: 8 mg/dL (ref 6–23)
Calcium, Ion: 1.19 mmol/L (ref 1.12–1.23)
Chloride: 103 mEq/L (ref 96–112)
Creatinine, Ser: 0.7 mg/dL (ref 0.50–1.10)
Glucose, Bld: 104 mg/dL — ABNORMAL HIGH (ref 70–99)
HCT: 46 % (ref 36.0–46.0)
Hemoglobin: 15.6 g/dL — ABNORMAL HIGH (ref 12.0–15.0)
Potassium: 3.6 mEq/L — ABNORMAL LOW (ref 3.7–5.3)
Sodium: 139 mEq/L (ref 137–147)
TCO2: 22 mmol/L (ref 0–100)

## 2014-05-28 LAB — POCT URINALYSIS DIP (DEVICE)
GLUCOSE, UA: NEGATIVE mg/dL
HGB URINE DIPSTICK: NEGATIVE
Ketones, ur: NEGATIVE mg/dL
Leukocytes, UA: NEGATIVE
NITRITE: NEGATIVE
Protein, ur: NEGATIVE mg/dL
Urobilinogen, UA: 1 mg/dL (ref 0.0–1.0)
pH: 5.5 (ref 5.0–8.0)

## 2014-05-28 MED ORDER — ONDANSETRON 4 MG PO TBDP
ORAL_TABLET | ORAL | Status: AC
  Filled 2014-05-28: qty 2

## 2014-05-28 MED ORDER — ONDANSETRON 8 MG PO TBDP: 8.0000 mg | ORAL_TABLET | Freq: Three times a day (TID) | ORAL | Status: DC | PRN

## 2014-05-28 MED ORDER — ONDANSETRON 4 MG PO TBDP
8.0000 mg | ORAL_TABLET | Freq: Once | ORAL | Status: AC
  Administered 2014-05-28: 8 mg via ORAL

## 2014-05-28 MED ORDER — SODIUM CHLORIDE 0.9 % IV SOLN: INTRAVENOUS | Status: DC

## 2014-05-28 MED ORDER — DIPHENOXYLATE-ATROPINE 2.5-0.025 MG PO TABS
1.0000 | ORAL_TABLET | Freq: Four times a day (QID) | ORAL | Status: DC | PRN
Start: 2014-05-28 — End: 2014-12-13

## 2014-05-28 NOTE — ED Notes (Signed)
Reports lower back pain, vomiting and diarrhea onset yesterday.  This am vomited x 3 , this am had diarrhea x 3 .  Denies vaginal discharge.

## 2014-05-28 NOTE — ED Provider Notes (Signed)
Chief Complaint   Emesis    History of Present Illness   Veronica Knight is a 23 year old female who's had a history since yesterday of feeling hot, sweats, and chills. She had some nausea, vomiting, and diarrhea. She estimates she had 5 episodes of emesis yesterday and 5 episodes of diarrhea in 2-3 episodes of emesis and diarrhea both today. There was no blood in the vomitus, coffee-ground emesis, or bilious emesis. She denies any blood in the stool or melena. She denies any abdominal pain or cramping. She feels tired, drained, her mouth is felt dry and she notes aching in her back and soreness of the feet. She denies any muscle cramping. She's had no suspicious exposures or ingestions. Last menstrual period was September 27. She states she's not been sexually active in several months.  Review of Systems   Other than as noted above, the patient denies any of the following symptoms: Systemic:  No fevers, chills, or dizziness. GI:  Blood in stool or vomitus. GU:  No dysuria, frequency, or urgency.  PMFSH   Past medical history, family history, social history, meds, and allergies were reviewed.    Physical Exam     Vital signs:  BP 100/74  Pulse 81  Temp(Src) 97.8 F (36.6 C) (Oral)  Resp 16  SpO2 100%  LMP 05/19/2014 Orthostatic VS for the past 24 hrs:  BP- Lying Pulse- Lying BP- Sitting Pulse- Sitting BP- Standing at 0 minutes Pulse- Standing at 0 minutes  05/28/14 2002 80/65 mmHg 72 102/78 mmHg 76 98/66 mmHg 98   General:  Alert and oriented.  In no distress.  Skin warm and dry.  Good skin turgor, brisk capillary refill. ENT:  No scleral icterus, moist mucous membranes, no oral lesions, pharynx clear. Lungs:  Breath sounds clear and equal bilaterally.  No wheezes, rales, or rhonchi. Heart:  Rhythm regular, without extrasystoles.  No gallops or murmers. Abdomen:  Soft, flat, nondistended. No organomegaly or mass. Bowel sounds were diminished. There was mild tenderness to  palpation across the entire lower abdomen without guarding or rebound. Skin: Clear, warm, and dry.  Good turgor.  Brisk capillary refill.  Labs   Results for orders placed during the hospital encounter of 05/28/14  CBC WITH DIFFERENTIAL      Result Value Ref Range   WBC 6.8  4.0 - 10.5 K/uL   RBC 4.46  3.87 - 5.11 MIL/uL   Hemoglobin 14.2  12.0 - 15.0 g/dL   HCT 16.1  09.6 - 04.5 %   MCV 91.3  78.0 - 100.0 fL   MCH 31.8  26.0 - 34.0 pg   MCHC 34.9  30.0 - 36.0 g/dL   RDW 40.9  81.1 - 91.4 %   Platelets 260  150 - 400 K/uL   Neutrophils Relative % 46  43 - 77 %   Neutro Abs 3.1  1.7 - 7.7 K/uL   Lymphocytes Relative 44  12 - 46 %   Lymphs Abs 2.9  0.7 - 4.0 K/uL   Monocytes Relative 8  3 - 12 %   Monocytes Absolute 0.6  0.1 - 1.0 K/uL   Eosinophils Relative 2  0 - 5 %   Eosinophils Absolute 0.1  0.0 - 0.7 K/uL   Basophils Relative 0  0 - 1 %   Basophils Absolute 0.0  0.0 - 0.1 K/uL  POCT URINALYSIS DIP (DEVICE)      Result Value Ref Range   Glucose, UA NEGATIVE  NEGATIVE mg/dL  Bilirubin Urine SMALL (*) NEGATIVE   Ketones, ur NEGATIVE  NEGATIVE mg/dL   Specific Gravity, Urine >=1.030  1.005 - 1.030   Hgb urine dipstick NEGATIVE  NEGATIVE   pH 5.5  5.0 - 8.0   Protein, ur NEGATIVE  NEGATIVE mg/dL   Urobilinogen, UA 1.0  0.0 - 1.0 mg/dL   Nitrite NEGATIVE  NEGATIVE   Leukocytes, UA NEGATIVE  NEGATIVE  POCT PREGNANCY, URINE      Result Value Ref Range   Preg Test, Ur NEGATIVE  NEGATIVE  POCT I-STAT, CHEM 8      Result Value Ref Range   Sodium 139  137 - 147 mEq/L   Potassium 3.6 (*) 3.7 - 5.3 mEq/L   Chloride 103  96 - 112 mEq/L   BUN 8  6 - 23 mg/dL   Creatinine, Ser 1.610.70  0.50 - 1.10 mg/dL   Glucose, Bld 096104 (*) 70 - 99 mg/dL   Calcium, Ion 0.451.19  4.091.12 - 1.23 mmol/L   TCO2 22  0 - 100 mmol/L   Hemoglobin 15.6 (*) 12.0 - 15.0 g/dL   HCT 81.146.0  91.436.0 - 78.246.0 %      Course in Urgent Care Center   The following medications were given:  Medications  ondansetron  (ZOFRAN-ODT) disintegrating tablet 8 mg (8 mg Oral Given 05/28/14 1928)    Since her blood pressure was low lying down, I am concerned about mild dehydration, although it did come out when she stood up or sat up. Also, she has good skin turgor, good capillary refill, and good pulses. There is no biochemical evidence of dehydration with the exception of concentrated urine. I recommended IV fluids, but she declines. She did vomit to get stuck again. She'll try oral rehydration tonight. I gave her 2 bottles of Gatorade, and told her to try to get both of these in tonight, then stick to a clear liquid diet tomorrow, followed by brat diet on Thursday. If she feels no better tomorrow, suggest she come back in for some IV fluids.  Assessment   The primary encounter diagnosis was Viral gastroenteritis. A diagnosis of Dehydration was also pertinent to this visit.  There is no evidence of appendicitis, given her low white count, and benign abdominal examination.  Plan   1.  Meds:  The following meds were prescribed:  Discharge Medication List as of 05/28/2014  8:16 PM    START taking these medications   Details  diphenoxylate-atropine (LOMOTIL) 2.5-0.025 MG per tablet Take 1 tablet by mouth 4 (four) times daily as needed for diarrhea or loose stools., Starting 05/28/2014, Until Discontinued, Print    ondansetron (ZOFRAN ODT) 8 MG disintegrating tablet Take 1 tablet (8 mg total) by mouth every 8 (eight) hours as needed for nausea., Starting 05/28/2014, Until Discontinued, Normal        2.  Patient Education/Counseling:  The patient was given appropriate handouts, self care instructions, and instructed in symptomatic relief. The patient was told to stay on clear liquids for the remainder of the day, then advance to a B.R.A.T. diet starting tomorrow.   3.  Follow up:  The patient was told to follow up here if no better in 2 to 3 days, or sooner if becoming worse in any way, and given some red flag symptoms  such as persistent vomitng, high fever, severe abdominal pain, or any GI bleeding which would prompt immediate return.         Reuben Likesavid C Sharnetta Gielow, MD 05/28/14 2105

## 2014-05-28 NOTE — Discharge Instructions (Signed)

## 2014-06-01 ENCOUNTER — Encounter (HOSPITAL_COMMUNITY): Payer: Self-pay | Admitting: Emergency Medicine

## 2014-06-01 ENCOUNTER — Emergency Department (HOSPITAL_COMMUNITY)
Admission: EM | Admit: 2014-06-01 | Discharge: 2014-06-01 | Disposition: A | Discharge status: Home / Self Care | Payer: Medicaid Other | Source: Home / Self Care

## 2014-06-01 DIAGNOSIS — N390 Urinary tract infection, site not specified: Secondary | ICD-10-CM | POA: Diagnosis not present

## 2014-06-01 DIAGNOSIS — M545 Low back pain, unspecified: Secondary | ICD-10-CM

## 2014-06-01 DIAGNOSIS — R319 Hematuria, unspecified: Secondary | ICD-10-CM

## 2014-06-01 LAB — POCT URINALYSIS DIP (DEVICE)
Bilirubin Urine: NEGATIVE
Glucose, UA: 100 mg/dL — AB
KETONES UR: NEGATIVE mg/dL
Leukocytes, UA: NEGATIVE
NITRITE: NEGATIVE
PH: 6.5 (ref 5.0–8.0)
Protein, ur: NEGATIVE mg/dL
Specific Gravity, Urine: 1.02 (ref 1.005–1.030)
Urobilinogen, UA: 1 mg/dL (ref 0.0–1.0)

## 2014-06-01 LAB — POCT PREGNANCY, URINE: Preg Test, Ur: NEGATIVE

## 2014-06-01 MED ORDER — NITROFURANTOIN MONOHYD MACRO 100 MG PO CAPS: 100.0000 mg | ORAL_CAPSULE | Freq: Two times a day (BID) | ORAL | Status: DC

## 2014-06-01 NOTE — ED Provider Notes (Signed)
Medical screening examination/treatment/procedure(s) were performed by non-physician practitioner and as supervising physician I was immediately available for consultation/collaboration.  Leslee Homeavid Jaquanda Wickersham, M.D.  Reuben Likesavid C Azaliyah Kennard, MD 06/01/14 [phone]

## 2014-06-01 NOTE — ED Notes (Signed)
i have another UTI. I get them all the time, I do not have any STD issues

## 2014-06-01 NOTE — ED Provider Notes (Signed)
CSN: [id number]     Arrival date & time 06/01/14  1120 History   First MD Initiated Contact with Patient 06/01/14 1148     Chief Complaint  Patient presents with  . Urinary Tract Infection   (Consider location/radiation/quality/duration/timing/severity/associated sxs/prior Treatment) HPI Comments: C/O pain across the lower back and hematuria. Hx of recurrent UTI's   Past Medical History  Diagnosis Date  . Irregular heart beat 07/06/11  . UTI (lower urinary tract infection)   . Heart murmur   . PFO (patent foramen ovale)   . Abortion    Past Surgical History  Procedure Laterality Date  . No past surgeries     Family History  Problem Relation Age of Onset  . Diabetes Mellitus II Other   . Breast cancer Other   . CAD Neg Hx   . Stroke Neg Hx    History  Substance Use Topics  . Smoking status: Current Some Day Smoker -- 0.25 packs/day    Types: Cigarettes  . Smokeless tobacco: Never Used  . Alcohol Use: No     Comment: not drinking since she found out she was pregnant   OB History   Grav Para Term Preterm Abortions TAB SAB Ect Mult Living   1              Review of Systems  Constitutional: Negative.   Genitourinary: Positive for hematuria. Negative for dysuria, urgency, frequency, flank pain, decreased urine volume, vaginal discharge and pelvic pain.    Allergies  Cherry  Home Medications   Prior to Admission medications   Medication Sig Start Date End Date Taking? Authorizing Provider  albuterol (PROVENTIL HFA;VENTOLIN HFA) 108 (90 BASE) MCG/ACT inhaler Inhale 2 puffs into the lungs every 6 (six) hours as needed for wheezing or shortness of breath.    Historical Provider, MD  diphenoxylate-atropine (LOMOTIL) 2.5-0.025 MG per tablet Take 1 tablet by mouth 4 (four) times daily as needed for diarrhea or loose stools. 05/28/14   Reuben Likesavid C Keller, MD  nitrofurantoin, macrocrystal-monohydrate, (MACROBID) 100 MG capsule Take 1 capsule (100 mg total) by mouth 2 (two)  times daily. 06/01/14   Hayden Rasmussenavid Aoi Kouns, NP  ondansetron (ZOFRAN ODT) 8 MG disintegrating tablet Take 1 tablet (8 mg total) by mouth every 8 (eight) hours as needed for nausea. 05/28/14   Reuben Likesavid C Keller, MD  Phenazopyridine HCl (AZO TABS PO) Take 2 tablets by mouth 3 (three) times daily as needed (pain).    Historical Provider, MD   BP 107/71  Pulse 90  Temp(Src) 97.4 F (36.3 C) (Oral)  SpO2 100%  LMP 05/19/2014 Physical Exam  Nursing note and vitals reviewed. Constitutional: She is oriented to person, place, and time. She appears well-developed and well-nourished. No distress.  Pulmonary/Chest: Effort normal. No respiratory distress.  Musculoskeletal:  Tenderness to R paralumbar muscle  Neurological: She is alert and oriented to person, place, and time. She exhibits normal muscle tone.  Skin: Skin is warm and dry.    ED Course  Procedures (including critical care time) Labs Review Labs Reviewed  POCT URINALYSIS DIP (DEVICE) - Abnormal; Notable for the following:    Glucose, UA 100 (*)    Hgb urine dipstick TRACE (*)    All other components within normal limits  URINE CULTURE  POCT PREGNANCY, URINE    Imaging Review No results found.   MDM   1. Hematuria   2. Bilateral low back pain without sciatica   3. Frequent UTI     Macrodantin  bid Urine culture pending. Plenty of fluids. Go to [facility] to obtain a PCP assignment.    Hayden Rasmussenavid Avielle Imbert, NP 06/01/14 1239

## 2014-06-01 NOTE — Discharge Instructions (Signed)
Back Pain, Adult °Low back pain is very common. About 1 in 5 people have back pain. The cause of low back pain is rarely dangerous. The pain often gets better over time. About half of people with a sudden onset of back pain feel better in just 2 weeks. About 8 in 10 people feel better by 6 weeks.  °CAUSES °Some common causes of back pain include: °· Strain of the muscles or ligaments supporting the spine. °· Wear and tear (degeneration) of the spinal discs. °· Arthritis. °· Direct injury to the back. °DIAGNOSIS °Most of the time, the direct cause of low back pain is not known. However, back pain can be treated effectively even when the exact cause of the pain is unknown. Answering your caregiver's questions about your overall health and symptoms is one of the most accurate ways to make sure the cause of your pain is not dangerous. If your caregiver needs more information, he or she may order lab work or imaging tests (X-rays or MRIs). However, even if imaging tests show changes in your back, this usually does not require surgery. °HOME CARE INSTRUCTIONS °For many people, back pain returns. Since low back pain is rarely dangerous, it is often a condition that people can learn to manage on their own.  °· Remain active. It is stressful on the back to sit or stand in one place. Do not sit, drive, or stand in one place for more than 30 minutes at a time. Take short walks on level surfaces as soon as pain allows. Try to increase the length of time you walk each day. °· Do not stay in bed. Resting more than 1 or 2 days can delay your recovery. °· Do not avoid exercise or work. Your body is made to move. It is not dangerous to be active, even though your back may hurt. Your back will likely heal faster if you return to being active before your pain is gone. °· Pay attention to your body when you  bend and lift. Many people have less discomfort when lifting if they bend their knees, keep the load close to their bodies, and  avoid twisting. Often, the most comfortable positions are those that put less stress on your recovering back. °· Find a comfortable position to sleep. Use a firm mattress and lie on your side with your knees slightly bent. If you lie on your back, put a pillow under your knees. °· Only take over-the-counter or prescription medicines as directed by your caregiver. Over-the-counter medicines to reduce pain and inflammation are often the most helpful. Your caregiver may prescribe muscle relaxant drugs. These medicines help dull your pain so you can more quickly return to your normal activities and healthy exercise. °· Put ice on the injured area. °· Put ice in a plastic bag. °· Place a towel between your skin and the bag. °· Leave the ice on for 15-20 minutes, 03-04 times a day for the first 2 to 3 days. After that, ice and heat may be alternated to reduce pain and spasms. °· Ask your caregiver about trying back exercises and gentle massage. This may be of some benefit. °· Avoid feeling anxious or stressed. Stress increases muscle tension and can worsen back pain. It is important to recognize when you are anxious or stressed and learn ways to manage it. Exercise is a great option. °SEEK MEDICAL CARE IF: °· You have pain that is not relieved with rest or medicine. °· You have pain that does not improve in 1 week. °· You have new symptoms. °· You are generally not feeling well. °SEEK   IMMEDIATE MEDICAL CARE IF:  °· You have pain that radiates from your back into your legs. °· You develop new bowel or bladder control problems. °· You have unusual weakness or numbness in your arms or legs. °· You develop nausea or vomiting. °· You develop abdominal pain. °· You feel faint. °Document Released: 08/09/2005 Document Revised: 02/08/2012 Document Reviewed: 12/11/2013 °[employer]® Patient Information ©2015 [employer]. This information is not intended to replace advice given to you by your health care provider. Make sure you  discuss any questions you have with your health care provider. ° °Urinary Tract Infection °A urinary tract infection (UTI) can occur any place along the urinary tract. The tract includes the kidneys, ureters, bladder, and urethra. A type of germ called bacteria often causes a UTI. UTIs are often helped with antibiotic medicine.  °HOME CARE  °· If given, take antibiotics as told by your doctor. Finish them even if you start to feel better. °· Drink enough fluids to keep your pee (urine) clear or pale yellow. °· Avoid tea, drinks with caffeine, and bubbly (carbonated) drinks. °· Pee often. Avoid holding your pee in for a long time. °· Pee before and after having sex (intercourse). °· Wipe from front to back after you poop (bowel movement) if you are a woman. Use each tissue only once. °GET HELP RIGHT AWAY IF:  °· You have back pain. °· You have lower belly (abdominal) pain. °· You have chills. °· You feel sick to your stomach (nauseous). °· You throw up (vomit). °· Your burning or discomfort with peeing does not go away. °· You have a fever. °· Your symptoms are not better in 3 days. °MAKE SURE YOU:  °· Understand these instructions. °· Will watch your condition. °· Will get help right away if you are not doing well or get worse. °Document Released: 01/26/2008 Document Revised: 05/03/2012 Document Reviewed: 03/09/2012 °[employer]® Patient Information ©2015 [employer]. This information is not intended to replace advice given to you by your health care provider. Make sure you discuss any questions you have with your health care provider. ° °

## 2014-06-02 LAB — URINE CULTURE

## 2014-06-24 ENCOUNTER — Encounter (HOSPITAL_COMMUNITY): Payer: Self-pay | Admitting: Emergency Medicine

## 2014-08-22 ENCOUNTER — Encounter (HOSPITAL_COMMUNITY): Payer: Self-pay | Admitting: Emergency Medicine

## 2014-08-22 ENCOUNTER — Emergency Department (HOSPITAL_COMMUNITY)
Admission: EM | Admit: 2014-08-22 | Discharge: 2014-08-22 | Disposition: A | Discharge status: Home / Self Care | Payer: Medicaid Other | Attending: Emergency Medicine | Admitting: Emergency Medicine

## 2014-08-22 DIAGNOSIS — Z79899 Other long term (current) drug therapy: Secondary | ICD-10-CM | POA: Diagnosis not present

## 2014-08-22 DIAGNOSIS — Q211 Atrial septal defect: Secondary | ICD-10-CM | POA: Insufficient documentation

## 2014-08-22 DIAGNOSIS — R011 Cardiac murmur, unspecified: Secondary | ICD-10-CM | POA: Insufficient documentation

## 2014-08-22 DIAGNOSIS — Z87440 Personal history of urinary (tract) infections: Secondary | ICD-10-CM | POA: Diagnosis not present

## 2014-08-22 DIAGNOSIS — L509 Urticaria, unspecified: Secondary | ICD-10-CM | POA: Insufficient documentation

## 2014-08-22 DIAGNOSIS — R21 Rash and other nonspecific skin eruption: Secondary | ICD-10-CM | POA: Diagnosis present

## 2014-08-22 DIAGNOSIS — Z8679 Personal history of other diseases of the circulatory system: Secondary | ICD-10-CM | POA: Insufficient documentation

## 2014-08-22 DIAGNOSIS — Z720 Tobacco use: Secondary | ICD-10-CM | POA: Insufficient documentation

## 2014-08-22 DIAGNOSIS — H0589 Other disorders of orbit: Secondary | ICD-10-CM | POA: Diagnosis not present

## 2014-08-22 MED ORDER — PERMETHRIN 5 % EX CREA: TOPICAL_CREAM | CUTANEOUS | Status: DC

## 2014-08-22 MED ORDER — HYDROCORTISONE 1 % EX CREA
TOPICAL_CREAM | Freq: Two times a day (BID) | CUTANEOUS | Status: DC
  Filled 2014-08-22: qty 28

## 2014-08-22 MED ORDER — PREDNISONE 10 MG PO TABS: 20.0000 mg | ORAL_TABLET | Freq: Every day | ORAL | Status: DC

## 2014-08-22 MED ORDER — HYDROCORTISONE 1 % EX CREA
TOPICAL_CREAM | Freq: Once | CUTANEOUS | Status: AC
  Administered 2014-08-22: 09:00:00 via TOPICAL

## 2014-08-22 MED ORDER — PREDNISONE 20 MG PO TABS
60.0000 mg | ORAL_TABLET | Freq: Once | ORAL | Status: AC
Start: 2014-08-22 — End: 2014-08-22
  Administered 2014-08-22: 60 mg via ORAL
  Filled 2014-08-22: qty 3

## 2014-08-22 MED ORDER — LORATADINE 10 MG PO TABS: 10.0000 mg | ORAL_TABLET | Freq: Every day | ORAL | Status: DC

## 2014-08-22 MED ORDER — DIPHENHYDRAMINE HCL 25 MG PO TABS: 25.0000 mg | ORAL_TABLET | Freq: Four times a day (QID) | ORAL | Status: DC

## 2014-08-22 NOTE — ED Provider Notes (Signed)
CSN: [id number]     Arrival date & time 08/22/14  [id number] History   First MD Initiated Contact with Patient 08/22/14 [phone]     Chief Complaint  Patient presents with  . Rash     (Consider location/radiation/quality/duration/timing/severity/associated sxs/prior Treatment) HPI   Patient to the ER with complaints of rash. Veronica Knight stayed awake all night last night talking to her dad. Veronica Knight had not yet gone asleep. The rash is on the upper back, lower back, lower abdomen, bilateral upper extremities and face. The rash is not on placed Veronica Knight had covered with clothes. It itches, is calling swelling, and is tender. Veronica Knight has urticaria.  Veronica Knight has no SOB, drooling, throat tightness, tongue swelling or throat swelling. Denies knowing if Veronica Knight has allergies. Veronica Knight reports her father denies having any rash.  Past Medical History  Diagnosis Date  . Irregular heart beat 07/06/11  . UTI (lower urinary tract infection)   . Heart murmur   . PFO (patent foramen ovale)   . Abortion    Past Surgical History  Procedure Laterality Date  . No past surgeries     Family History  Problem Relation Age of Onset  . Diabetes Mellitus II Other   . Breast cancer Other   . CAD Neg Hx   . Stroke Neg Hx    History  Substance Use Topics  . Smoking status: Current Some Day Smoker -- 0.25 packs/day    Types: Cigarettes  . Smokeless tobacco: Never Used  . Alcohol Use: No     Comment: not drinking since Veronica Knight found out Veronica Knight was pregnant   OB History    Gravida Para Term Preterm AB TAB SAB Ectopic Multiple Living   1              Review of Systems  10 Systems reviewed and are negative for acute change except as noted in the HPI.    Allergies  Cherry  Home Medications   Prior to Admission medications   Medication Sig Start Date End Date Taking? Authorizing Provider  albuterol (PROVENTIL HFA;VENTOLIN HFA) 108 (90 BASE) MCG/ACT inhaler Inhale 2 puffs into the lungs every 6 (six) hours as needed for wheezing or shortness of  breath.    Historical Provider, MD  diphenhydrAMINE (BENADRYL) 25 MG tablet Take 1 tablet (25 mg total) by mouth every 6 (six) hours. 08/22/14   Feleshia Zundel Irine SealG Aleisa Howk, PA-C  diphenoxylate-atropine (LOMOTIL) 2.5-0.025 MG per tablet Take 1 tablet by mouth 4 (four) times daily as needed for diarrhea or loose stools. 05/28/14   Reuben Likesavid C Keller, MD  loratadine (CLARITIN) 10 MG tablet Take 1 tablet (10 mg total) by mouth daily. 08/22/14   Mance Vallejo Irine SealG Jacub Waiters, PA-C  nitrofurantoin, macrocrystal-monohydrate, (MACROBID) 100 MG capsule Take 1 capsule (100 mg total) by mouth 2 (two) times daily. 06/01/14   Hayden Rasmussenavid Mabe, NP  ondansetron (ZOFRAN ODT) 8 MG disintegrating tablet Take 1 tablet (8 mg total) by mouth every 8 (eight) hours as needed for nausea. 05/28/14   Reuben Likesavid C Keller, MD  permethrin (ELIMITE) 5 % cream Apply to affected area once 08/22/14   Dorthula Matasiffany G Jianni Shelden, PA-C  Phenazopyridine HCl (AZO TABS PO) Take 2 tablets by mouth 3 (three) times daily as needed (pain).    Historical Provider, MD  predniSONE (DELTASONE) 10 MG tablet Take 2 tablets (20 mg total) by mouth daily. 08/22/14   Lanya Bucks Irine SealG Annalisse Minkoff, PA-C   BP 105/72 mmHg  Pulse 83  Temp(Src) 97.8 F (36.6 C) (Oral)  Resp 18  SpO2 100% Physical Exam  Constitutional: Veronica Knight appears well-developed and well-nourished. No distress.  HENT:  Head: Normocephalic and atraumatic. Head is with right periorbital erythema. Head is without raccoon's eyes, without Battle's sign, without abrasion, without contusion and without left periorbital erythema. Hair is normal.  Right Ear: Tympanic membrane and ear canal normal.  Left Ear: Tympanic membrane and ear canal normal.  Nose: Nose normal.  Mouth/Throat: Uvula is midline and oropharynx is clear and moist.  Eyes: Pupils are equal, round, and reactive to light.  Neck: Normal range of motion. Neck supple.  Cardiovascular: Normal rate and regular rhythm.   Pulmonary/Chest: Effort normal.  Abdominal: Soft.  Neurological: Veronica Knight  is alert.  Skin: Skin is warm and dry. Rash noted. Rash is urticarial.     Mildly indurated. NO ecchymosis or signs of papules.  Nursing note and vitals reviewed.   ED Course  Procedures (including critical care time) Labs Review Labs Reviewed - No data to display  Imaging Review No results found.   EKG Interpretation None      MDM   Final diagnoses:  Rash    Patient does not have fever or systemic symptoms with rash. Veronica Knight has no throat swelling, drooling, SOB, wheezing.  Rx: Oral prednisone, benadryl, Claritin. Veronica Knight will also be given Permethrin cream since Veronica Knight was in a new place and is concerned about possible bug bites.   23 y.o.Veronica Knight's evaluation in the Emergency Department is complete. It has been determined that no acute conditions requiring further emergency intervention are present at this time. The patient/guardian have been advised of the diagnosis and plan. We have discussed signs and symptoms that warrant return to the ED, such as changes or worsening in symptoms.  Vital signs are stable at discharge. Filed Vitals:   08/22/14 0835  BP: 105/72  Pulse: 83  Temp: 97.8 F (36.6 C)  Resp: 18    Patient/guardian has voiced understanding and agreed to follow-up with the PCP or specialist.     Dorthula Matasiffany G Karlynn Furrow, PA-C 08/22/14 0901  Rolland PorterMark James, MD 08/24/14 [phone]

## 2014-08-22 NOTE — ED Notes (Signed)
Patient states woke up this morning, after spending the night for the first time at her Fathers house, with a rash to hands, neck, chest and lower back.  Redness and swelling accompanying the rash.

## 2014-08-22 NOTE — Discharge Instructions (Signed)
Contact Dermatitis Contact dermatitis is a reaction to certain substances that touch the skin. Contact dermatitis can be either irritant contact dermatitis or allergic contact dermatitis. Irritant contact dermatitis does not require previous exposure to the substance for a reaction to occur.Allergic contact dermatitis only occurs if you have been exposed to the substance before. Upon a repeat exposure, your body reacts to the substance.  CAUSES  Many substances can cause contact dermatitis. Irritant dermatitis is most commonly caused by repeated exposure to mildly irritating substances, such as:  Makeup.  Soaps.  Detergents. Bleaches.Hives Hives are itchy, red, swollen areas of the skin. They can vary in size and location on your body. Hives can come and go for hours or several days (acute hives) or for several weeks (chronic hives). Hives do not spread from person to person (noncontagious). They may get worse with scratching, exercise, and emotional stress. CAUSES  Allergic reaction to food, additives, or drugs. Infections, including the common cold. Illness, such as vasculitis, lupus, or thyroid disease. Exposure to sunlight, heat, or cold. Exercise. Stress. Contact with chemicals. SYMPTOMS  Red or white swollen patches on the skin. The patches may change size, shape, and location quickly and repeatedly. Itching. Swelling of the hands, feet, and face. This may occur if hives develop deeper in the skin. DIAGNOSIS  Your caregiver can usually tell what is wrong by performing a physical exam. Skin or blood tests may also be done to determine the cause of your hives. In some cases, the cause cannot be determined. TREATMENT  Mild cases usually get better with medicines such as antihistamines. Severe cases may require an emergency epinephrine injection. If the cause of your hives is known, treatment includes avoiding that trigger.  HOME CARE INSTRUCTIONS  Avoid causes that trigger your  hives. Take antihistamines as directed by your caregiver to reduce the severity of your hives. Non-sedating or low-sedating antihistamines are usually recommended. Do not drive while taking an antihistamine. Take any other medicines prescribed for itching as directed by your caregiver. Wear loose-fitting clothing. Keep all follow-up appointments as directed by your caregiver. SEEK MEDICAL CARE IF:  You have persistent or severe itching that is not relieved with medicine. You have painful or swollen joints. SEEK IMMEDIATE MEDICAL CARE IF:  You have a fever. Your tongue or lips are swollen. You have trouble breathing or swallowing. You feel tightness in the throat or chest. You have abdominal pain. These problems may be the first sign of a life-threatening allergic reaction. Call your local emergency services (911 in [place] MAKE SURE YOU:  Understand these instructions. Will watch your condition. Will get help right away if you are not doing well or get worse. Document Released: 08/09/2005 Document Revised: 08/14/2013 Document Reviewed: 11/02/2011 [facility] Patient Information 2015 [place], [place]. This information is not intended to replace advice given to you by your health care provider. Make sure you discuss any questions you have with your health care provider.    Acids.  Metal salts, such as nickel. Allergic contact dermatitis is most commonly caused by exposure to:  Poisonous plants.  Chemicals (deodorants, shampoos).  Jewelry.  Latex.  Neomycin in triple antibiotic cream.  Preservatives in products, including clothing. SYMPTOMS  The area of skin that is exposed may develop:  Dryness or flaking.  Redness.  Cracks.  Itching.  Pain or a burning sensation.  Blisters. With allergic contact dermatitis, there may also be swelling in areas such as the eyelids, mouth, or genitals.  DIAGNOSIS  Your caregiver  can usually tell what the problem is by doing a physical  exam. In cases where the cause is uncertain and an allergic contact dermatitis is suspected, a patch skin test may be performed to help determine the cause of your dermatitis. TREATMENT Treatment includes protecting the skin from further contact with the irritating substance by avoiding that substance if possible. Barrier creams, powders, and gloves may be helpful. Your caregiver may also recommend:  Steroid creams or ointments applied 2 times daily. For best results, soak the rash area in cool water for 20 minutes. Then apply the medicine. Cover the area with a plastic wrap. You can store the steroid cream in the refrigerator for a "chilly" effect on your rash. That may decrease itching. Oral steroid medicines may be needed in more severe cases.  Antibiotics or antibacterial ointments if a skin infection is present.  Antihistamine lotion or an antihistamine taken by mouth to ease itching.  Lubricants to keep moisture in your skin.  Burow's solution to reduce redness and soreness or to dry a weeping rash. Mix one packet or tablet of solution in 2 cups cool water. Dip a clean washcloth in the mixture, wring it out a bit, and put it on the affected area. Leave the cloth in place for 30 minutes. Do this as often as possible throughout the day.  Taking several cornstarch or baking soda baths daily if the area is too large to cover with a washcloth. Harsh chemicals, such as alkalis or acids, can cause skin damage that is like a burn. You should flush your skin for 15 to 20 minutes with cold water after such an exposure. You should also seek immediate medical care after exposure. Bandages (dressings), antibiotics, and pain medicine may be needed for severely irritated skin.  HOME CARE INSTRUCTIONS  Avoid the substance that caused your reaction.  Keep the area of skin that is affected away from hot water, soap, sunlight, chemicals, acidic substances, or anything else that would irritate your skin.  Do  not scratch the rash. Scratching may cause the rash to become infected.  You may take cool baths to help stop the itching.  Only take over-the-counter or prescription medicines as directed by your caregiver.  See your caregiver for follow-up care as directed to make sure your skin is healing properly. SEEK MEDICAL CARE IF:   Your condition is not better after 3 days of treatment.  You seem to be getting worse.  You see signs of infection such as swelling, tenderness, redness, soreness, or warmth in the affected area.  You have any problems related to your medicines. Document Released: 08/06/2000 Document Revised: 11/01/2011 Document Reviewed: 01/12/2011 [facility] CenterExitCare Patient Information 2015 [place], [place]. This information is not intended to replace advice given to you by your health care provider. Make sure you discuss any questions you have with your health care provider.

## 2014-08-23 NOTE — L&D Delivery Note (Signed)
Delivery Note At 6:45 PM a viable female, "As yet unnamed", was delivered via Vaginal, Spontaneous Delivery (Presentation: Left Occiput Anterior).  APGAR: 9, 9; weight  .   Placenta status: Intact, Spontaneous.  Succenturiate lobe and velamentous insertion noted.  Sent to path. Pathology.  Cord: 3 vessels with the following complications: None.  Cord pH: NA  Anesthesia: None  Episiotomy: None Lacerations: 1st degree vaginal; Right labial Suture Repair: 3.0 vicryl Est. Blood Loss (mL): 250  Mom to postpartum.  Baby to Couplet care / Skin to Skin . Patient plans outpatient circumcision. Plan SW consult due to hx of family conflict during pregnancy--see [facility] note 01/09/15.   Nigel BridgemanLATHAM, Sparkles Mcneely 06/08/2015, 7:32 PM

## 2014-10-08 ENCOUNTER — Encounter (HOSPITAL_COMMUNITY): Payer: Self-pay | Admitting: Emergency Medicine

## 2014-10-08 ENCOUNTER — Emergency Department (HOSPITAL_COMMUNITY)
Admission: EM | Admit: 2014-10-08 | Discharge: 2014-10-09 | Disposition: A | Discharge status: Home / Self Care | Payer: Medicaid Other | Attending: Emergency Medicine | Admitting: Emergency Medicine

## 2014-10-08 DIAGNOSIS — Z7952 Long term (current) use of systemic steroids: Secondary | ICD-10-CM | POA: Diagnosis not present

## 2014-10-08 DIAGNOSIS — Z79899 Other long term (current) drug therapy: Secondary | ICD-10-CM | POA: Diagnosis not present

## 2014-10-08 DIAGNOSIS — R590 Localized enlarged lymph nodes: Secondary | ICD-10-CM | POA: Insufficient documentation

## 2014-10-08 DIAGNOSIS — Z720 Tobacco use: Secondary | ICD-10-CM | POA: Diagnosis not present

## 2014-10-08 DIAGNOSIS — Q211 Atrial septal defect: Secondary | ICD-10-CM | POA: Insufficient documentation

## 2014-10-08 DIAGNOSIS — Z8679 Personal history of other diseases of the circulatory system: Secondary | ICD-10-CM | POA: Insufficient documentation

## 2014-10-08 DIAGNOSIS — Z87440 Personal history of urinary (tract) infections: Secondary | ICD-10-CM | POA: Diagnosis not present

## 2014-10-08 DIAGNOSIS — R011 Cardiac murmur, unspecified: Secondary | ICD-10-CM | POA: Diagnosis not present

## 2014-10-08 DIAGNOSIS — Z331 Pregnant state, incidental: Secondary | ICD-10-CM | POA: Diagnosis not present

## 2014-10-08 NOTE — ED Notes (Signed)
Patient presents c/o abscess to left axillary x1.5 weeks. Patient rates pain 6/10, tender to touch. Reports fever "a few weeks" ago with a sore throat. Denies drainage to same.

## 2014-10-09 ENCOUNTER — Encounter (HOSPITAL_COMMUNITY): Payer: Self-pay | Admitting: Emergency Medicine

## 2014-10-09 ENCOUNTER — Emergency Department (HOSPITAL_COMMUNITY)
Admission: EM | Admit: 2014-10-09 | Discharge: 2014-10-09 | Discharge status: Against Medical Advice | Payer: Medicaid Other | Attending: Emergency Medicine | Admitting: Emergency Medicine

## 2014-10-09 ENCOUNTER — Emergency Department (HOSPITAL_COMMUNITY): Payer: Medicaid Other

## 2014-10-09 DIAGNOSIS — R102 Pelvic and perineal pain: Secondary | ICD-10-CM | POA: Diagnosis present

## 2014-10-09 DIAGNOSIS — B9689 Other specified bacterial agents as the cause of diseases classified elsewhere: Secondary | ICD-10-CM

## 2014-10-09 DIAGNOSIS — Z720 Tobacco use: Secondary | ICD-10-CM | POA: Diagnosis not present

## 2014-10-09 DIAGNOSIS — R011 Cardiac murmur, unspecified: Secondary | ICD-10-CM | POA: Insufficient documentation

## 2014-10-09 DIAGNOSIS — Z87440 Personal history of urinary (tract) infections: Secondary | ICD-10-CM | POA: Diagnosis not present

## 2014-10-09 DIAGNOSIS — Z8679 Personal history of other diseases of the circulatory system: Secondary | ICD-10-CM | POA: Insufficient documentation

## 2014-10-09 DIAGNOSIS — N760 Acute vaginitis: Secondary | ICD-10-CM | POA: Insufficient documentation

## 2014-10-09 DIAGNOSIS — R590 Localized enlarged lymph nodes: Secondary | ICD-10-CM | POA: Diagnosis not present

## 2014-10-09 DIAGNOSIS — Z7952 Long term (current) use of systemic steroids: Secondary | ICD-10-CM | POA: Insufficient documentation

## 2014-10-09 DIAGNOSIS — R1031 Right lower quadrant pain: Secondary | ICD-10-CM

## 2014-10-09 DIAGNOSIS — Z79899 Other long term (current) drug therapy: Secondary | ICD-10-CM | POA: Diagnosis not present

## 2014-10-09 DIAGNOSIS — Z331 Pregnant state, incidental: Secondary | ICD-10-CM | POA: Diagnosis not present

## 2014-10-09 DIAGNOSIS — Q211 Atrial septal defect: Secondary | ICD-10-CM | POA: Insufficient documentation

## 2014-10-09 DIAGNOSIS — Z3490 Encounter for supervision of normal pregnancy, unspecified, unspecified trimester: Secondary | ICD-10-CM

## 2014-10-09 DIAGNOSIS — R591 Generalized enlarged lymph nodes: Secondary | ICD-10-CM

## 2014-10-09 LAB — WET PREP, GENITAL
Trich, Wet Prep: NONE SEEN
Yeast Wet Prep HPF POC: NONE SEEN

## 2014-10-09 LAB — PROTIME-INR
INR: 1.07 (ref 0.00–1.49)
Prothrombin Time: 14 seconds (ref 11.6–15.2)

## 2014-10-09 LAB — COMPREHENSIVE METABOLIC PANEL
ALT: 12 U/L (ref 0–35)
AST: 14 U/L (ref 0–37)
Albumin: 3.5 g/dL (ref 3.5–5.2)
Alkaline Phosphatase: 37 U/L — ABNORMAL LOW (ref 39–117)
Anion gap: 5 (ref 5–15)
BUN: 9 mg/dL (ref 6–23)
CALCIUM: 9.1 mg/dL (ref 8.4–10.5)
CHLORIDE: 108 mmol/L (ref 96–112)
CO2: 22 mmol/L (ref 19–32)
Creatinine, Ser: 0.62 mg/dL (ref 0.50–1.10)
GLUCOSE: 77 mg/dL (ref 70–99)
Potassium: 4.1 mmol/L (ref 3.5–5.1)
Sodium: 135 mmol/L (ref 135–145)
TOTAL PROTEIN: 6.3 g/dL (ref 6.0–8.3)
Total Bilirubin: 0.3 mg/dL (ref 0.3–1.2)

## 2014-10-09 LAB — CBC WITH DIFFERENTIAL/PLATELET
BASOS ABS: 0.1 10*3/uL (ref 0.0–0.1)
Basophils Relative: 1 % (ref 0–1)
EOS ABS: 0.2 10*3/uL (ref 0.0–0.7)
Eosinophils Relative: 2 % (ref 0–5)
HCT: 38.2 % (ref 36.0–46.0)
Hemoglobin: 13.2 g/dL (ref 12.0–15.0)
Lymphocytes Relative: 43 % (ref 12–46)
Lymphs Abs: 3.6 10*3/uL (ref 0.7–4.0)
MCH: 30.8 pg (ref 26.0–34.0)
MCHC: 34.6 g/dL (ref 30.0–36.0)
MCV: 89 fL (ref 78.0–100.0)
MONOS PCT: 8 % (ref 3–12)
Monocytes Absolute: 0.7 10*3/uL (ref 0.1–1.0)
Neutro Abs: 3.7 10*3/uL (ref 1.7–7.7)
Neutrophils Relative %: 46 % (ref 43–77)
Platelets: 224 10*3/uL (ref 150–400)
RBC: 4.29 MIL/uL (ref 3.87–5.11)
RDW: 14.1 % (ref 11.5–15.5)
WBC: 8.3 10*3/uL (ref 4.0–10.5)

## 2014-10-09 LAB — URINALYSIS, ROUTINE W REFLEX MICROSCOPIC
Bilirubin Urine: NEGATIVE
Glucose, UA: NEGATIVE mg/dL
HGB URINE DIPSTICK: NEGATIVE
Ketones, ur: NEGATIVE mg/dL
LEUKOCYTES UA: NEGATIVE
Nitrite: NEGATIVE
PH: 6 (ref 5.0–8.0)
PROTEIN: NEGATIVE mg/dL
Specific Gravity, Urine: 1.021 (ref 1.005–1.030)
Urobilinogen, UA: 0.2 mg/dL (ref 0.0–1.0)

## 2014-10-09 LAB — HCG, QUANTITATIVE, PREGNANCY: HCG, BETA CHAIN, QUANT, S: 1031 m[IU]/mL — AB (ref <5)

## 2014-10-09 LAB — POC URINE PREG, ED: Preg Test, Ur: POSITIVE — AB

## 2014-10-09 LAB — APTT: aPTT: 31 seconds (ref 24–37)

## 2014-10-09 MED ORDER — PRENATAL COMPLETE 14-0.4 MG PO TABS: 1.0000 | ORAL_TABLET | Freq: Every day | ORAL | Status: DC

## 2014-10-09 MED ORDER — METRONIDAZOLE 500 MG PO TABS: 500.0000 mg | ORAL_TABLET | Freq: Two times a day (BID) | ORAL | Status: DC

## 2014-10-09 MED ORDER — IBUPROFEN 600 MG PO TABS: 600.0000 mg | ORAL_TABLET | Freq: Three times a day (TID) | ORAL | Status: AC

## 2014-10-09 MED ORDER — IBUPROFEN 200 MG PO TABS
600.0000 mg | ORAL_TABLET | Freq: Once | ORAL | Status: AC
  Administered 2014-10-09: 600 mg via ORAL
  Filled 2014-10-09: qty 3

## 2014-10-09 NOTE — ED Notes (Signed)
Patient refusing discharge vitals.

## 2014-10-09 NOTE — Discharge Instructions (Signed)
Swollen Lymph Nodes The lymphatic system filters fluid from around cells. It is like a system of blood vessels. These channels carry lymph instead of blood. The lymphatic system is an important part of the immune (disease fighting) system. When people talk about "swollen glands in the neck," they are usually talking about swollen lymph nodes. The lymph nodes are like the little traps for infection. You and your caregiver may be able to feel lymph nodes, especially swollen nodes, in these common areas: the groin (inguinal area), armpits (axilla), and above the clavicle (supraclavicular). You may also feel them in the neck (cervical) and the back of the head just above the hairline (occipital). Swollen glands occur when there is any condition in which the body responds with an allergic type of reaction. For instance, the glands in the neck can become swollen from insect bites or any type of minor infection on the head. These are very noticeable in children with only minor problems. Lymph nodes may also become swollen when there is a tumor or problem with the lymphatic system, such as Hodgkin's disease. TREATMENT   Most swollen glands do not require treatment. They can be observed (watched) for a short period of time, if your caregiver feels it is necessary. Most of the time, observation is not necessary.  Antibiotics (medicines that kill germs) may be prescribed by your caregiver. Your caregiver may prescribe these if he or she feels the swollen glands are due to a bacterial (germ) infection. Antibiotics are not used if the swollen glands are caused by a virus. HOME CARE INSTRUCTIONS   Take medications as directed by your caregiver. Only take over-the-counter or prescription medicines for pain, discomfort, or fever as directed by your caregiver. SEEK MEDICAL CARE IF:   If you begin to run a temperature greater than 102 F (38.9 C), or as your caregiver suggests. MAKE SURE YOU:   Understand these  instructions.  Will watch your condition.  Will get help right away if you are not doing well or get worse. Document Released: 07/30/2002 Document Revised: 11/01/2011 Document Reviewed: 08/09/2005 [facility]ExitCare Patient Information 2015 [place]ExitCare, [place]. This information is not intended to replace advice given to you by your health care provider. Make sure you discuss any questions you have with your health care provider. The area for any changes.  If you develop fever or rapid heart rate.  Please return for further evaluation.  Otherwise take ibuprofen on a regular basis.  Warm compresses were good idea as well

## 2014-10-09 NOTE — ED Provider Notes (Signed)
CSN: [id number]     Arrival date & time 10/08/14  2259 History   First MD Initiated Contact with Patient 10/08/14 2356     No chief complaint on file.    (Consider location/radiation/quality/duration/timing/severity/associated sxs/prior Treatment) HPI Comments: Patient has had a small swollen, tender area in her left axilla for the past week and half, that waxes and wanes in intensity when she places warm compresses.  The area gets smaller when she doesn't do anything to it for a few days ago.  Larger.  She denies any fever, numbness and tingling to the extremity.  No redness or warmth.  Recently she's had a sore throat approximately 2 weeks ago.  Other than that, denies any viral illnesses  The history is provided by the patient.    Past Medical History  Diagnosis Date  . Irregular heart beat 07/06/11  . UTI (lower urinary tract infection)   . Heart murmur   . PFO (patent foramen ovale)   . Abortion    Past Surgical History  Procedure Laterality Date  . No past surgeries     Family History  Problem Relation Age of Onset  . Diabetes Mellitus II Other   . Breast cancer Other   . CAD Neg Hx   . Stroke Neg Hx    History  Substance Use Topics  . Smoking status: Current Some Day Smoker -- 0.50 packs/day    Types: Cigarettes  . Smokeless tobacco: Never Used  . Alcohol Use: Yes     Comment: not drinking since she found out she was pregnant   OB History    Gravida Para Term Preterm AB TAB SAB Ectopic Multiple Living   1              Review of Systems  Constitutional: Negative for fever.  Musculoskeletal: Positive for arthralgias.  All other systems reviewed and are negative.     Allergies  Cherry  Home Medications   Prior to Admission medications   Medication Sig Start Date End Date Taking? Authorizing Provider  albuterol (PROVENTIL HFA;VENTOLIN HFA) 108 (90 BASE) MCG/ACT inhaler Inhale 2 puffs into the lungs every 6 (six) hours as needed for wheezing or shortness  of breath.    Historical Provider, MD  diphenhydrAMINE (BENADRYL) 25 MG tablet Take 1 tablet (25 mg total) by mouth every 6 (six) hours. 08/22/14   Tiffany Irine Seal, PA-C  diphenoxylate-atropine (LOMOTIL) 2.5-0.025 MG per tablet Take 1 tablet by mouth 4 (four) times daily as needed for diarrhea or loose stools. 05/28/14   Reuben Likes, MD  loratadine (CLARITIN) 10 MG tablet Take 1 tablet (10 mg total) by mouth daily. 08/22/14   Tiffany Irine Seal, PA-C  nitrofurantoin, macrocrystal-monohydrate, (MACROBID) 100 MG capsule Take 1 capsule (100 mg total) by mouth 2 (two) times daily. 06/01/14   Hayden Rasmussen, NP  ondansetron (ZOFRAN ODT) 8 MG disintegrating tablet Take 1 tablet (8 mg total) by mouth every 8 (eight) hours as needed for nausea. 05/28/14   Reuben Likes, MD  permethrin (ELIMITE) 5 % cream Apply to affected area once 08/22/14   Dorthula Matas, PA-C  Phenazopyridine HCl (AZO TABS PO) Take 2 tablets by mouth 3 (three) times daily as needed (pain).    Historical Provider, MD  predniSONE (DELTASONE) 10 MG tablet Take 2 tablets (20 mg total) by mouth daily. 08/22/14   Dorthula Matas, PA-C   BP 97/61 mmHg  Pulse 96  Temp(Src) 98.1 F (36.7 C) (Oral)  Resp 18  SpO2 98%  LMP 09/08/2014 Physical Exam  Constitutional: She is oriented to person, place, and time. She appears well-developed and well-nourished.  HENT:  Head: Normocephalic.  Eyes: Pupils are equal, round, and reactive to light.  Neck: Normal range of motion.  Cardiovascular: Normal rate.   Pulmonary/Chest: Effort normal.  Abdominal: Soft.  Musculoskeletal: Normal range of motion.  Lymphadenopathy:    She has axillary adenopathy.       Left axillary: Lateral adenopathy present.  Neurological: She is alert and oriented to person, place, and time.  Skin: No erythema.  Small 5mm firm mobile nodule in upper inner arm, not quite in the axilla without any surrounding erythema  Nursing note and vitals reviewed.   ED Course   Procedures (including critical care time) Labs Review Labs Reviewed - No data to display  Imaging Review No results found.   EKG Interpretation None     Patient has been instructed to take ibuprofen and warm compresses on a regular basis to watch the area carefully for any changes at this time.  It is firm and mobile.  I believe to be a enlarged lymph node MDM   Final diagnoses:  None         Arman FilterGail K Yilin Weedon, NP 10/09/14 [id number]  Aaylah Mackielga M Otter, MD 10/09/14 [phone]

## 2014-10-09 NOTE — Discharge Instructions (Signed)
You have chosen to leave the hospital AGAINST MEDICAL ADVICE, as we have discussed this could result in your death, death of your fetus or permanent disability. I hope you change your life and return to the emergency room earliest convenience. Please resource guide below to establish primary care and set up an appointment with her OB/GYN as soon as possible.  Follow with OB/GYN as soon as possible.   Do NOT take any NSAIDs, such as Aspirin, Motrin, Ibuprofen, Aleve, Naproxen etc. Only take Tylenol for pain. Return to the emergency room  for any severe abdominal pain, increasing vaginal bleeding, passing out or repeated vomiting.   Obtain over-the-counter prenatal vitamins. Read the label and make sure that they have at least 400 mcg of folate acid.  Please go to the [facility] office in [facility] to apply for coverage. Alternatively, you can could go to the DSS office in [facility] to apply for emergency coverage.    First Trimester of Pregnancy The first trimester of pregnancy is from week 1 until the end of week 12 (months 1 through 3). During this time, your baby will begin to develop inside you. At 6-8 weeks, the eyes and face are formed, and the heartbeat can be seen on ultrasound. At the end of 12 weeks, all the baby's organs are formed. Prenatal care is all the medical care you receive before the birth of your baby. Make sure you get good prenatal care and follow all of your doctor's instructions. HOME CARE  Medicines  Take medicine only as told by your doctor. Some medicines are safe and some are not during pregnancy.  Take your prenatal vitamins as told by your doctor.  Take medicine that helps you poop (stool softener) as needed if your doctor says it is okay. Diet  Eat regular, healthy meals.  Your doctor will tell you the amount of weight gain that is right for you.  Avoid raw meat and uncooked cheese.  If you feel sick to your stomach (nauseous) or throw up (vomit):  Eat 4  or 5 small meals a day instead of 3 large meals.  Try eating a few soda crackers.  Drink liquids between meals instead of during meals.  If you have a hard time pooping (constipation):  Eat high-fiber foods like fresh vegetables, fruit, and whole grains.  Drink enough fluids to keep your pee (urine) clear or pale yellow. Activity and Exercise  Exercise only as told by your doctor. Stop exercising if you have cramps or pain in your lower belly (abdomen) or low back.  Try to avoid standing for long periods of time. Move your legs often if you must stand in one place for a long time.  Avoid heavy lifting.  Wear low-heeled shoes. Sit and stand up straight.  You can have sex unless your doctor tells you not to. Relief of Pain or Discomfort  Wear a good support bra if your breasts are sore.  Take warm water baths (sitz baths) to soothe pain or discomfort caused by hemorrhoids. Use hemorrhoid cream if your doctor says it is okay.  Rest with your legs raised if you have leg cramps or low back pain.  Wear support hose if you have puffy, bulging veins (varicose veins) in your legs. Raise (elevate) your feet for 15 minutes, 3-4 times a day. Limit salt in your diet. Prenatal Care  Schedule your prenatal visits by the twelfth week of pregnancy.  Write down your questions. Take them to your prenatal visits.  Keep all your prenatal visits as told by your doctor. Safety  Wear your seat belt at all times when driving.  Make a list of emergency phone numbers. The list should include numbers for family, friends, the hospital, and police and fire departments. General Tips  Ask your doctor for a referral to a local prenatal class. Begin classes no later than at the start of month 6 of your pregnancy.  Ask for help if you need counseling or help with nutrition. Your doctor can give you advice or tell you where to go for help.  Do not use hot tubs, steam rooms, or saunas.  Do not douche  or use tampons or scented sanitary pads.  Do not cross your legs for long periods of time.  Avoid litter boxes and soil used by cats.  Avoid all smoking, herbs, and alcohol. Avoid drugs not approved by your doctor.  Visit your dentist. At home, brush your teeth with a soft toothbrush. Be gentle when you floss. GET HELP IF:  You are dizzy.  You have mild cramps or pressure in your lower belly.  You have a nagging pain in your belly area.  You continue to feel sick to your stomach, throw up, or have watery poop (diarrhea).  You have a bad smelling fluid coming from your vagina.  You have pain with peeing (urination).  You have increased puffiness (swelling) in your face, hands, legs, or ankles. GET HELP RIGHT AWAY IF:   You have a fever.  You are leaking fluid from your vagina.  You have spotting or bleeding from your vagina.  You have very bad belly cramping or pain.  You gain or lose weight rapidly.  You throw up blood. It may look like coffee grounds.  You are around people who have [place] measles, fifth disease, or chickenpox.  You have a very bad headache.  You have shortness of breath.  You have any kind of trauma, such as from a fall or a car accident. Document Released: 01/26/2008 Document Revised: 12/24/2013 Document Reviewed: 06/19/2013 [facility] Patient Information 2015 [place], [place]. This information is not intended to replace advice given to you by your health care provider. Make sure you discuss any questions you have with your health care provider.  Emergency Department Resource Guide 1) Find a Doctor and Pay Out of Pocket Although you won't have to find out who is covered by your insurance plan, it is a good idea to ask around and get recommendations. You will then need to call the office and see if the doctor you have chosen will accept you as a new patient and what types of options they offer for patients who are self-pay. Some doctors offer  discounts or will set up payment plans for their patients who do not have insurance, but you will need to ask so you aren't surprised when you get to your appointment.  2) Contact Your Local Health Department Not all health departments have doctors that can see patients for sick visits, but many do, so it is worth a call to see if yours does. If you don't know where your local health department is, you can check in your phone book. The CDC also has a tool to help you locate your state's health department, and many state websites also have listings of all of their local health departments.  3) Find a Walk-in Clinic If your illness is not likely to be very severe or complicated, you may want to try a  walk in clinic. These are popping up all over the country in pharmacies, drugstores, and shopping centers. They're usually staffed by nurse practitioners or physician assistants that have been trained to treat common illnesses and complaints. They're usually fairly quick and inexpensive. However, if you have serious medical issues or chronic medical problems, these are probably not your best option.  No Primary Care Doctor: - Call Health Connect at  [phone] - they can help you locate a primary care doctor that  accepts your insurance, provides certain services, etc. - Physician Referral Service- [phone]  Chronic Pain Problems: Organization         Address  Phone   Notes  [facility]  [phone] Patients need to be referred by their primary care doctor.   Medication Assistance: Organization         Address  Phone   Notes  [facility] Medication [facility] [address] [place] [place], [place] [address] [phone] --Must be a resident of [facility] -- Must have NO insurance coverage whatsoever (no Medicaid/ Medicare, etc.) -- The pt. MUST have a primary care doctor that directs their care regularly and follows them in the community   [facility]   [phone]   [employer]  [phone]    Agencies that provide inexpensive medical care: Organization         Address  Phone   Notes  [facility]  [phone]   [facility]    [phone]   [facility] [address] [place], [place] [address] [phone]   [facility] [address] [place] [place] [phone]   Planned Parenthood    [phone]   [facility]    [phone]   [facility]  [address], [place] Phone:  [phone], Fax:  [phone] Hours of Operation:  9 am - 6 pm, M-F.  Also accepts Medicaid/Medicare and self-pay.  [facility]  [address], [place] Phone: [phone], Fax: [phone]. Hours of Operation:  8:30 am - 5:30 pm, M-F.  Also accepts Medicaid and self-pay.  [facility] [place] [address] [place] Phone: [phone]   [facility] [address] Natasha Bence [place], [place] [phone], Ext. [phone] Mondays & Thursdays: 7-9 AM.  First 15 patients are seen on a first come, first serve basis.    Medicaid-accepting [facility] Providers:  Organization         Address  Phone   Notes  [facility] [address], [place] [phone] Also accepts self-pay patients.  [facility] [address] Laurell Josephs [place], [place]  [phone]   [facility] [address], [place] [phone]   [facility] [address] [place] [phone]   Renaye Rakers [address], [place]   [phone] Only accepts [place] patients after they have their name applied to their card.   Self-Pay (no insurance) in [facility]:  Organization         Address  Phone   Notes  Sickle Cell Patients, [facility] [address] [place], [place] [phone]   [facility] [address] [place], [place] [phone]   Patrcia Dolly  [facility]  [address], [place] [phone]   [facility]/Dr. Osei-Bonsu  [address] [place] or [address], [place] [phone] Phone number for both [place] and [place] locations is the same.  [facility]gent [facility] and [facility] [address] [place] [phone]   [facility] [address], [place] or [address] [phone]   [facility] [address], [place] [phone], phone; [phone], fax Sees patients 1st and 3rd Saturday of every month.  Must not qualify for public or private insurance (i.e. Medicaid, Medicare, [place] Health Choice, Veterans' Benefits)  Household income should be no more than 200% of the poverty level The clinic cannot treat you if you are pregnant or think you are pregnant  Sexually transmitted diseases are not treated at the clinic.    Dental Care: Organization         Address  Phone  Notes  [facility] Department of [facility] [address] [place], [place] [phone] Accepts children up to age 65 who are enrolled in [place] or [place] Health Choice; pregnant women with a Medicaid card; and children who have applied for Medicaid or [place] Health Choice, but were declined, whose parents can pay a reduced fee at time of service.  [facility] Department of [facility]  [address], [place] [phone] Accepts children up to age 84 who are enrolled in [place] or [place] Health Choice; pregnant women with a Medicaid card; and children who have applied for Medicaid or  Health Choice, but were declined, whose parents can pay a reduced fee at time of service.  [facility]  [address] [place], [place]  [phone] Patients are seen by appointment only. Walk-ins are not accepted. [facility] will see patients 36 years of age and older. Monday - Tuesday (8am-5pm) Most Wednesdays (8:30-5pm) $30 per visit, cash only  [facility] Adult Dental Access PRO[facility]  [address], [facility] [phone] Patients are seen by appointment only. Walk-ins are not accepted. [facility] will see patients 78 years of age and older. One Wednesday Evening (Monthly: Volunteer Based).  $30 per visit, cash only  [employer]  [phone] for adults; Children under age 56, call Graduate Pediatric Dentistry at [phone]. Children aged 56-14, please call [phone] to request a pediatric application.  Dental services are provided in all areas of dental care including fillings, crowns and bridges, complete and partial dentures, implants, gum treatment, root canals, and extractions. Preventive care is also provided. Treatment is provided to both adults and children. Patients are selected via a lottery and there is often a waiting list.   [facility] [address] [place]  [phone] [email or web address]   [facility] [address] [place], [place] [phone], Ext. [phone] Second and Fourth Thursday of each month, opens at 6:30 AM; Clinic ends at 9 AM.  Patients are seen on a first-come first-served basis, and a limited number are seen during each clinic.   [facility]  [address] Ether Griffins [place], [place] [phone]   Eligibility Requirements You must have lived in [place], [place], or [place] counties for at least the last three months.   You cannot be eligible for state or federal sponsored [place],  including [employer]IG[employer]eterans Administration, [place]icaid, or [employer]icare.   You generally cannot be eligible for healthcare insurance through your employer.    How to apply: Eligibility screenings are held every Tuesday and Wednesday  afternoon from 1:00 pm until 4:00 pm. You do not need an appointment for the interview!  [facility] [address], [place], [place] [phone]   [facility]  [phone]   [facility]  [phone]   [facility]  [phone]    Behavioral Health Resources in the Community: Intensive Outpatient Programs Organization         Address  Phone  Notes  [facility] [address], [place], [place] [phone]   [facility] [address], [place], [place] [phone]   [facility] [address], [place], [place]  [phone]   [facility] [address],  [place], [place] [phone] or [phone]   Substance Abuse Resources Organization         Address  Phone  Notes  [facility]  [phone]   [facility]  [phone]   [facility]  [phone]   [facility]  [phone]   Residential & Outpatient Substance Abuse Program  [phone]   Psychological Services Organization         Address  Phone  Notes  [facility]  [phone]- [phone]   [facility]  [phone]- [phone]   [facility] [address], [place] [phone] or [phone]    Mobile Crisis Teams Organization         Address  Phone  Notes  [facility], Mobile Crisis Care Unit  [phone]   [facility]  [address] [place], [place] [phone]   Doristine LocksSharon DeEsch [address] [place] [phone]    Self-Help/Support Groups Organization         Address  Phone             Notes  [facility]oc. of [place] - variety of support groups  336- [phone] Call for more information  Narcotics Anonymous (NA), [facility] [address], [place]  2 meetings at this location   Nutritional therapistesidential Treatment  Programs Organization         Address  Phone  Notes  [facility] [address],    [place]  [phone]   [facility]  [address], [place] [phone], [place], [place] [phone]   [facility] [address], [place] [phone] Admissions: 8am-3pm M-F  [facility] [address]    [place], [place] [phone]   [facility] [address], [place], [place] [phone]   [facility] [address]  [place], [place] [phone]   [facility] - Intensive Outpatient [address], [place], [place] [phone]   [facility] ([facility] [address]  [place], [place] [phone] or [phone]   [facility] ([facility]) [address] [place], [place] [phone] Accepts Medicaid  [facility] [address]  [place] [phone] Substance Abuse/Addiction Treatment   [facility] Resources Organization         Address  Phone  Notes  [facility]  [phone]   Angie FavaJulie Brannon, PhD [address] [place], [place]   [phone]) [phone] or [phone]  [phone]   [facility]   [address] [place], [place] [phone]   [facility] [address] [place], [place] [phone] Insurance/Medicaid/sponsorship through [facility] and Families [address] [place] [address]                                    [place], [place] [phone] Therapy/tele-psych/case  [facility] [address]   [place], [place] [phone]    Dr. Lolly Mustache  [phone]   [facility] [place]  [employer] [facility] Dept. 1) [address], [place] 2) [address], [place] 3)  [address], [place] [phone]  [phone]   [facility] Child Abuse Hotline [phone]) [phone] or [phone] (After Hours)

## 2014-10-09 NOTE — ED Notes (Signed)
POC Preg- POSITIVE

## 2014-10-09 NOTE — ED Notes (Signed)
Called pt at home per PA request and informed her of diagnosis.  Pt informed abx Rx at nurse first and the importance of her taking full Rx.

## 2014-10-09 NOTE — ED Notes (Signed)
This nurse observed Veronica Knight, [place], explain to patient that she is leaving against medical advice. Patient informed this pregnancy could be ectopic and could be a risk to her life. Pt stated she understood, and that she had to leave. Patient refused discharge vitals, stated "My vitals are fine." Pt in NAD, signed AMA, and walked out door without any issue.

## 2014-10-09 NOTE — ED Provider Notes (Signed)
CSN: [id number]     Arrival date & time 10/09/14  1849 History  This chart was scribed for Wynetta EmeryNicole Ah Bott, PA-C, working with Glynn OctaveStephen Rancour, MD by Chestine SporeSoijett Blue, ED Scribe. The patient was seen in room TR11C/TR11C at 8:18 PM.    Chief Complaint  Patient presents with  . Arm Swelling     The history is provided by the patient. No language interpreter was used.    HPI Comments: Veronica Knight is a 24 y.o. female who presents to the Emergency Department complaining of tender swelling to left axilla onset 1.5 weeks (note the patient does not complain about arm pain or swelling, note that this contradicts triage notes). She states that she was seen for her swollen lymph node last night and was concerned because they're what is causing the swelling. She reports that her last breast check was last year; she does not perform self breast exams. She states that she is having associated symptoms of pelvic pain, lower right abdominal pain. Patient states that when she wakes in the morning she feels congested and when she blows her nose the rhinorrhea is blood streaked. She has not had true epistaxis. Pt reports that the abdominal pain have been occurring for 3 days and comes mainly when she is active. She reports that her abdominal pain is 6-7/10 and intermittent lasting seconds at a time. She denies vaginal discharge, fever, chills, sinus pressure or congestion, otalgia, sore throat, cough, chest pain, shortness of breath, rash, cervicalgia, night sweats, unintentional weight loss, easy bleeding, easy bruising. . She notes that she does not have a PCP but she does have a OB-GYN. She denies having her appendix out. Last menstrual period was approximately one month ago.   Past Medical History  Diagnosis Date  . Irregular heart beat 07/06/11  . UTI (lower urinary tract infection)   . Heart murmur   . PFO (patent foramen ovale)   . Abortion    Past Surgical History  Procedure Laterality Date  . No  past surgeries     Family History  Problem Relation Age of Onset  . Diabetes Mellitus II Other   . Breast cancer Other   . CAD Neg Hx   . Stroke Neg Hx    History  Substance Use Topics  . Smoking status: Current Some Day Smoker -- 0.50 packs/day    Types: Cigarettes  . Smokeless tobacco: Never Used  . Alcohol Use: Yes     Comment: not drinking since she found out she was pregnant   OB History    Gravida Para Term Preterm AB TAB SAB Ectopic Multiple Living   1              Review of Systems  A complete 10 system review of systems was obtained and all systems are negative except as noted in the HPI and PMH.    Allergies  Cherry  Home Medications   Prior to Admission medications   Medication Sig Start Date End Date Taking? Authorizing Provider  albuterol (PROVENTIL HFA;VENTOLIN HFA) 108 (90 BASE) MCG/ACT inhaler Inhale 2 puffs into the lungs every 6 (six) hours as needed for wheezing or shortness of breath.    Historical Provider, MD  diphenhydrAMINE (BENADRYL) 25 MG tablet Take 1 tablet (25 mg total) by mouth every 6 (six) hours. 08/22/14   Tiffany Irine SealG Greene, PA-C  diphenoxylate-atropine (LOMOTIL) 2.5-0.025 MG per tablet Take 1 tablet by mouth 4 (four) times daily as needed for diarrhea or loose  stools. 05/28/14   Reuben Likes, MD  ibuprofen (ADVIL,MOTRIN) 600 MG tablet Take 1 tablet (600 mg total) by mouth 3 (three) times daily. 10/09/14 10/19/14  Arman Filter, NP  loratadine (CLARITIN) 10 MG tablet Take 1 tablet (10 mg total) by mouth daily. 08/22/14   Tiffany Irine Seal, PA-C  nitrofurantoin, macrocrystal-monohydrate, (MACROBID) 100 MG capsule Take 1 capsule (100 mg total) by mouth 2 (two) times daily. 06/01/14   Hayden Rasmussen, NP  ondansetron (ZOFRAN ODT) 8 MG disintegrating tablet Take 1 tablet (8 mg total) by mouth every 8 (eight) hours as needed for nausea. 05/28/14   Reuben Likes, MD  permethrin (ELIMITE) 5 % cream Apply to affected area once 08/22/14   Dorthula Matas,  PA-C  Phenazopyridine HCl (AZO TABS PO) Take 2 tablets by mouth 3 (three) times daily as needed (pain).    Historical Provider, MD  predniSONE (DELTASONE) 10 MG tablet Take 2 tablets (20 mg total) by mouth daily. 08/22/14   Tiffany Irine Seal, PA-C   BP 104/60 mmHg  Pulse 82  Temp(Src) 99 F (37.2 C) (Oral)  Resp 16  SpO2 100%  LMP 09/08/2014  Physical Exam  Constitutional: She is oriented to person, place, and time. She appears well-developed and well-nourished. No distress.  HENT:  Head: Normocephalic and atraumatic.  Mouth/Throat: Oropharynx is clear and moist.  Eyes: EOM are normal. Pupils are equal, round, and reactive to light.  Neck: Normal range of motion. Neck supple. No tracheal deviation present.  FROM to C-spine. Pt can touch chin to chest without discomfort. No TTP of midline cervical spine.   Cardiovascular: Normal rate, regular rhythm and intact distal pulses.   Pulmonary/Chest: Effort normal and breath sounds normal. No respiratory distress. She has no wheezes. She has no rales. She exhibits no tenderness.  Abdominal: Soft. Bowel sounds are normal. She exhibits no distension and no mass. There is tenderness. There is no rebound and no guarding.  Normoactive bowel sounds, RLQ TTP, Rovsing, psoas, an obturator are all negative.  Genitourinary:  Pelvic exam a chaperoned by nurse: No rashes or lesions, scant, white, opaque cervical discharge. No cervical motion or adnexal tenderness.  Musculoskeletal: Normal range of motion.  Lymphadenopathy:       Head (right side): No submental, no submandibular, no tonsillar, no preauricular, no posterior auricular and no occipital adenopathy present.       Head (left side): No submental, no submandibular, no tonsillar, no preauricular, no posterior auricular and no occipital adenopathy present.    She has no cervical adenopathy.       Right cervical: No superficial cervical, no deep cervical and no posterior cervical adenopathy  present.      Left cervical: No superficial cervical, no deep cervical and no posterior cervical adenopathy present.    She has axillary adenopathy.       Right axillary: No lateral adenopathy present.       Left axillary: Lateral adenopathy present.       Right: No inguinal and no supraclavicular adenopathy present.       Left: No inguinal and no supraclavicular adenopathy present.  No abdominal lymphadenopathy, including negative sister Britta Mccreedy node.  + 0.5 cm tender, mobile nodule to the posterior left axilla, no overlying skin changes including erythema or induration, no discharge, no warmth.  Neurological: She is alert and oriented to person, place, and time.  Skin: Skin is warm and dry.  Psychiatric: She has a normal mood and affect. Her  behavior is normal.  Nursing note and vitals reviewed.   ED Course  Procedures (including critical care time) DIAGNOSTIC STUDIES: Oxygen Saturation is 100% on room air, normal by my interpretation.    COORDINATION OF CARE: 8:29 PM-Discussed treatment plan which includes CBC, pelvic exam, wet prep, UA, RPR, HIV, CMAT with pt at bedside and pt agreed to plan.   Labs Review Labs Reviewed  WET PREP, GENITAL - Abnormal; Notable for the following:    Clue Cells Wet Prep HPF POC TOO NUMEROUS TO COUNT (*)    WBC, Wet Prep HPF POC RARE (*)    All other components within normal limits  COMPREHENSIVE METABOLIC PANEL - Abnormal; Notable for the following:    Alkaline Phosphatase 37 (*)    All other components within normal limits  HCG, QUANTITATIVE, PREGNANCY - Abnormal; Notable for the following:    hCG, Beta Chain, Quant, S 1031 (*)    All other components within normal limits  POC URINE PREG, ED - Abnormal; Notable for the following:    Preg Test, Ur POSITIVE (*)    All other components within normal limits  CBC WITH DIFFERENTIAL/PLATELET  PROTIME-INR  APTT  URINALYSIS, ROUTINE W REFLEX MICROSCOPIC  RPR  HIV ANTIBODY (ROUTINE TESTING)   GC/CHLAMYDIA PROBE AMP ([place])    Imaging Review No results found.   EKG Interpretation None      MDM   Final diagnoses:  Pregnancy  Lymphadenopathy  Colicky right lower quadrant pain  Bacterial vaginosis    Filed Vitals:   10/09/14 1933  BP: 104/60  Pulse: 82  Temp: 99 F (37.2 C)  TempSrc: Oral  Resp: 16  SpO2: 100%    Veronica Knight is a pleasant 24 y.o. female presenting with solitary, tender, mobile left axillary lymph node, in addition to blood-streaked rhinorrhea and right lower quadrant abdominal pain. The symptoms are negative, no other lymphadenopathy appreciated. Patient is tender in the right lower quadrant however, based on the description of her pain I doubt that this is a progressive appendicitis.  She has declined pain medication.  I have informed the patient that she is pregnant, she states that she thinks she had a period last month but cannot remember the exact date. We've discussed the possibility that with her abdominal pain the fetus could be located in a dangerous place and have explained to her that we would like to get a ultrasound to evaluate the location of the fetus for her safety. Patient initially agreed to this, upon further discussion when I was asked to return to the patient's room she would like to know immediately why her lymph node is swollen and what precisely the infection is that is causing it. I have explained to her that I cannot tell her with certainty that it is an infection or what infection is causing it, explained to her that we have the results of the blood work back we will have more information. Patient has refused to stay for results of urinalysis, blood work or ultrasound. States that she cannot be in the ED at 10:30 PM, refuses to explain why this is an issue. I've invited her to return back to the ED at any time.  Veronica Knight is an adult of sound mindand is alert and oriented x3, has capacity for medical  decision making . We have discussed the signs and symptoms of right lower quadrant pain in combination of early pregnancy which raise concern for ectopic pregnancy. We have discussed  the limitations of the exam and recommended treatment plan of blood work, urinalysis and pelvic ultrasound to evaluate ectopic pregnancy.  We have discussed the risks of forgoing treatment including death, disability and loss of the fetus and loss of future fertility.  I have asked to speak with family members and friends and patient has refused. We have had multiple discussions about my  concerns and potential alternative treatment plans over the course of their stay in the ED. I have asked the nursing staff to also encourage the patient to stay.Veronica Knight has specifically refused to stay and is refusing any further care and is leaving against medical advice. I have answered all of the patients questions, treated the patient with prenatal vitamins., given them outpatient treatments of prenatal vitamins advised them to not hesitate to call 911 and return to the ED if they change their mind and have [facility].   Discharge Medication List as of 10/09/2014  9:34 PM    START taking these medications   Details  Prenatal Vit-Fe Fumarate-FA (PRENATAL COMPLETE) 14-0.4 MG TABS Take 1 tablet by mouth daily., Starting 10/09/2014, Until Discontinued, Print        Wet prep has resulted after the patient has left the ED with clue cells too numerous to count. I have asked the charge nurse to contact the patient said that she can be treated for bacterial vaginosis, I verbally gave orders of metronidazole 500 twice a day for 14 days.   Charge nurse Clydie Braun has contacted the patient who will return to the ED to pick up her prescription. Prescription is left at the front desk, we have asked the person who is working nurse first to stress to the patient that she is  welcome to check back into the ED to obtain her ultrasound. I have updated her discharge diagnosis to reflect right lower quadrant pain and bacterial vaginosis.  I personally performed the services described in this documentation, which was scribed in my presence. The recorded information has been reviewed and is accurate.    Wynetta Emery, PA-C 10/10/14 0017  Wynetta Emery, PA-C 10/10/14 [id number]  Glynn Octave, MD 10/10/14 [phone]

## 2014-10-09 NOTE — ED Notes (Signed)
Pt. reports swelling " lymph nodes " at left axilla and bleeding at left nares every morning , no bleeding at arrival .

## 2014-10-10 LAB — GC/CHLAMYDIA PROBE AMP (~~LOC~~) NOT AT ARMC
Chlamydia: NEGATIVE
NEISSERIA GONORRHEA: NEGATIVE

## 2014-10-11 LAB — HIV ANTIBODY (ROUTINE TESTING W REFLEX): HIV SCREEN 4TH GENERATION: NONREACTIVE

## 2014-10-11 LAB — RPR: RPR Ser Ql: NONREACTIVE

## 2014-10-18 ENCOUNTER — Encounter (HOSPITAL_COMMUNITY): Payer: Self-pay | Admitting: Emergency Medicine

## 2014-10-18 DIAGNOSIS — Z7952 Long term (current) use of systemic steroids: Secondary | ICD-10-CM | POA: Insufficient documentation

## 2014-10-18 DIAGNOSIS — N8320 Unspecified ovarian cysts: Secondary | ICD-10-CM | POA: Diagnosis not present

## 2014-10-18 DIAGNOSIS — Z87440 Personal history of urinary (tract) infections: Secondary | ICD-10-CM | POA: Diagnosis not present

## 2014-10-18 DIAGNOSIS — Z3A01 Less than 8 weeks gestation of pregnancy: Secondary | ICD-10-CM | POA: Insufficient documentation

## 2014-10-18 DIAGNOSIS — Z79899 Other long term (current) drug therapy: Secondary | ICD-10-CM | POA: Insufficient documentation

## 2014-10-18 DIAGNOSIS — R011 Cardiac murmur, unspecified: Secondary | ICD-10-CM | POA: Insufficient documentation

## 2014-10-18 DIAGNOSIS — O200 Threatened abortion: Secondary | ICD-10-CM | POA: Diagnosis not present

## 2014-10-18 DIAGNOSIS — Q211 Atrial septal defect: Secondary | ICD-10-CM | POA: Diagnosis not present

## 2014-10-18 DIAGNOSIS — Z792 Long term (current) use of antibiotics: Secondary | ICD-10-CM | POA: Diagnosis not present

## 2014-10-18 DIAGNOSIS — O9989 Other specified diseases and conditions complicating pregnancy, childbirth and the puerperium: Secondary | ICD-10-CM | POA: Insufficient documentation

## 2014-10-18 DIAGNOSIS — F17200 Nicotine dependence, unspecified, uncomplicated: Secondary | ICD-10-CM | POA: Insufficient documentation

## 2014-10-18 DIAGNOSIS — O99331 Smoking (tobacco) complicating pregnancy, first trimester: Secondary | ICD-10-CM | POA: Insufficient documentation

## 2014-10-18 DIAGNOSIS — Z8679 Personal history of other diseases of the circulatory system: Secondary | ICD-10-CM | POA: Diagnosis not present

## 2014-10-18 DIAGNOSIS — Z791 Long term (current) use of non-steroidal anti-inflammatories (NSAID): Secondary | ICD-10-CM | POA: Insufficient documentation

## 2014-10-18 DIAGNOSIS — O209 Hemorrhage in early pregnancy, unspecified: Secondary | ICD-10-CM | POA: Diagnosis present

## 2014-10-18 DIAGNOSIS — O34591 Maternal care for other abnormalities of gravid uterus, first trimester: Secondary | ICD-10-CM | POA: Diagnosis not present

## 2014-10-18 LAB — OB RESULTS CONSOLE ABO/RH: RH Type: POSITIVE

## 2014-10-18 LAB — OB RESULTS CONSOLE GC/CHLAMYDIA
Chlamydia: NEGATIVE
GC PROBE AMP, GENITAL: NEGATIVE

## 2014-10-18 LAB — OB RESULTS CONSOLE HEPATITIS B SURFACE ANTIGEN: Hepatitis B Surface Ag: NEGATIVE

## 2014-10-18 LAB — OB RESULTS CONSOLE ANTIBODY SCREEN: ANTIBODY SCREEN: NEGATIVE

## 2014-10-18 LAB — OB RESULTS CONSOLE HIV ANTIBODY (ROUTINE TESTING): HIV: NONREACTIVE

## 2014-10-18 LAB — OB RESULTS CONSOLE RPR: RPR: NONREACTIVE

## 2014-10-18 NOTE — ED Notes (Signed)
Pt states she is 5 weeks and 5 days pregnant.  Reports dark brown vaginal bleeding and LLQ cramping that started just pta.  Pt states she had brownish-white discharge x 1 week.

## 2014-10-19 ENCOUNTER — Emergency Department (HOSPITAL_COMMUNITY)
Admission: EM | Admit: 2014-10-19 | Discharge: 2014-10-19 | Disposition: A | Discharge status: Home / Self Care | Payer: Medicaid Other | Attending: Emergency Medicine | Admitting: Emergency Medicine

## 2014-10-19 ENCOUNTER — Emergency Department (HOSPITAL_COMMUNITY): Payer: Medicaid Other

## 2014-10-19 DIAGNOSIS — N83201 Unspecified ovarian cyst, right side: Secondary | ICD-10-CM

## 2014-10-19 DIAGNOSIS — O200 Threatened abortion: Secondary | ICD-10-CM

## 2014-10-19 DIAGNOSIS — R102 Pelvic and perineal pain: Secondary | ICD-10-CM

## 2014-10-19 DIAGNOSIS — O26899 Other specified pregnancy related conditions, unspecified trimester: Secondary | ICD-10-CM

## 2014-10-19 LAB — CBC
HCT: 40.1 % (ref 36.0–46.0)
HEMOGLOBIN: 14.4 g/dL (ref 12.0–15.0)
MCH: 31.2 pg (ref 26.0–34.0)
MCHC: 35.9 g/dL (ref 30.0–36.0)
MCV: 87 fL (ref 78.0–100.0)
PLATELETS: 276 10*3/uL (ref 150–400)
RBC: 4.61 MIL/uL (ref 3.87–5.11)
RDW: 13.3 % (ref 11.5–15.5)
WBC: 6.9 10*3/uL (ref 4.0–10.5)

## 2014-10-19 LAB — TYPE AND SCREEN
ABO/RH(D): O POS
ANTIBODY SCREEN: NEGATIVE

## 2014-10-19 LAB — WET PREP, GENITAL
TRICH WET PREP: NONE SEEN
Yeast Wet Prep HPF POC: NONE SEEN

## 2014-10-19 LAB — ABO/RH: ABO/RH(D): O POS

## 2014-10-19 LAB — HCG, QUANTITATIVE, PREGNANCY: hCG, Beta Chain, Quant, S: 35867 m[IU]/mL — ABNORMAL HIGH (ref <5)

## 2014-10-19 LAB — OB RESULTS CONSOLE RUBELLA ANTIBODY, IGM: Rubella: IMMUNE

## 2014-10-19 MED ORDER — ACETAMINOPHEN 325 MG PO TABS
650.0000 mg | ORAL_TABLET | Freq: Four times a day (QID) | ORAL | Status: DC | PRN
  Administered 2014-10-19: 650 mg via ORAL
  Filled 2014-10-19: qty 2

## 2014-10-19 NOTE — ED Notes (Signed)
MD at bedside. 

## 2014-10-19 NOTE — ED Provider Notes (Signed)
CSN: [id number]     Arrival date & time 10/18/14  2340 History   First MD Initiated Contact with Patient 10/19/14 0305    This chart was scribed for Charletha Mackie, MD by Marica Marshal Eskew, ED Scribe. This patient was seen in room D32C/D32C and the patient's care was started at 3:15 AM.   Chief Complaint  Patient presents with  . Vaginal Bleeding   HPI PCP: PROVIDER NOT IN SYSTEM HPI Comments: Veronica Knight is a 24 y.o. female, with PMH noted below including daily tobacco use, and who is [redacted] weeks pregnant (last period 09/08/14), who presents to the Emergency Department complaining of vaginal bleeding with associated cramping in lower abdomen and back onset today. Pt reports she is presently on antibiotics and prenatal vitamins. Pt notes she was treated at the ED last week and was Dx with bacterial vaginosis and subsequently started on antibiotics.  Denies any dysuria.  Patient is G2 P1 with prior abortion one year ago.  Past Medical History  Diagnosis Date  . Irregular heart beat 07/06/11  . UTI (lower urinary tract infection)   . Heart murmur   . PFO (patent foramen ovale)   . Abortion    Past Surgical History  Procedure Laterality Date  . No past surgeries    . Induced abortion     Family History  Problem Relation Age of Onset  . Diabetes Mellitus II Other   . Breast cancer Other   . CAD Neg Hx   . Stroke Neg Hx    History  Substance Use Topics  . Smoking status: Current Some Day Smoker -- 0.50 packs/day    Types: Cigarettes  . Smokeless tobacco: Never Used  . Alcohol Use: Yes     Comment: not drinking since she found out she was pregnant   OB History    Gravida Para Term Preterm AB TAB SAB Ectopic Multiple Living   2              Review of Systems  Constitutional: Negative for fever and chills.  Genitourinary: Positive for vaginal bleeding.  Musculoskeletal: Positive for back pain.       Cramping to LE  Psychiatric/Behavioral: Negative for confusion.  All other  systems reviewed and are negative.  Allergies  Cherry  Home Medications   Prior to Admission medications   Medication Sig Start Date End Date Taking? Authorizing Provider  albuterol (PROVENTIL HFA;VENTOLIN HFA) 108 (90 BASE) MCG/ACT inhaler Inhale 2 puffs into the lungs every 6 (six) hours as needed for wheezing or shortness of breath.    Historical Provider, MD  diphenhydrAMINE (BENADRYL) 25 MG tablet Take 1 tablet (25 mg total) by mouth every 6 (six) hours. 08/22/14   Tiffany Irine Seal, PA-C  diphenoxylate-atropine (LOMOTIL) 2.5-0.025 MG per tablet Take 1 tablet by mouth 4 (four) times daily as needed for diarrhea or loose stools. 05/28/14   Reuben Likes, MD  ibuprofen (ADVIL,MOTRIN) 600 MG tablet Take 1 tablet (600 mg total) by mouth 3 (three) times daily. 10/09/14 10/19/14  Arman Filter, NP  loratadine (CLARITIN) 10 MG tablet Take 1 tablet (10 mg total) by mouth daily. 08/22/14   Tiffany Irine Seal, PA-C  metroNIDAZOLE (FLAGYL) 500 MG tablet Take 1 tablet (500 mg total) by mouth 2 (two) times daily. 10/09/14   Nicole Pisciotta, PA-C  nitrofurantoin, macrocrystal-monohydrate, (MACROBID) 100 MG capsule Take 1 capsule (100 mg total) by mouth 2 (two) times daily. 06/01/14   Hayden Rasmussen, NP  ondansetron (ZOFRAN ODT) 8 MG disintegrating tablet Take 1 tablet (8 mg total) by mouth every 8 (eight) hours as needed for nausea. 05/28/14   Reuben Likesavid C Keller, MD  permethrin (ELIMITE) 5 % cream Apply to affected area once 08/22/14   Dorthula Matasiffany G Greene, PA-C  Phenazopyridine HCl (AZO TABS PO) Take 2 tablets by mouth 3 (three) times daily as needed (pain).    Historical Provider, MD  predniSONE (DELTASONE) 10 MG tablet Take 2 tablets (20 mg total) by mouth daily. 08/22/14   Dorthula Matasiffany G Greene, PA-C  Prenatal Vit-Fe Fumarate-FA (PRENATAL COMPLETE) 14-0.4 MG TABS Take 1 tablet by mouth daily. 10/09/14   Nicole Pisciotta, PA-C   Triage Vitals: BP 100/61 mmHg  Pulse 64  Temp(Src) 98.2 F (36.8 C) (Oral)  Resp 20  SpO2  98%  LMP 09/08/2014 Physical Exam  Constitutional: She is oriented to person, place, and time. She appears well-developed and well-nourished. No distress.  HENT:  Head: Normocephalic and atraumatic.  Nose: Nose normal.  Mouth/Throat: Oropharynx is clear and moist.  Eyes: Conjunctivae and EOM are normal. Pupils are equal, round, and reactive to light.  Neck: Normal range of motion. Neck supple. No JVD present. No tracheal deviation present. No thyromegaly present.  Cardiovascular: Normal rate, regular rhythm, normal heart sounds and intact distal pulses.  Exam reveals no gallop and no friction rub.   No murmur heard. Pulmonary/Chest: Effort normal and breath sounds normal. No stridor. No respiratory distress. She has no wheezes. She has no rales. She exhibits no tenderness.  Abdominal: Soft. Bowel sounds are normal. She exhibits no distension and no mass. There is no tenderness. There is no rebound and no guarding.  Genitourinary:  External genitalia within normal limits Vagina with discharge dark brown Cervix  normal negative for cervical motion tenderness Adnexa palpated, mass to right adnexa with tenderness masses Bladder palpated positive for tenderness Uterus palpated gravid consistent with dates, moderate tenderness tenderness    Musculoskeletal: Normal range of motion. She exhibits no edema or tenderness.  Lymphadenopathy:    She has no cervical adenopathy.  Neurological: She is alert and oriented to person, place, and time. She displays normal reflexes. She exhibits normal muscle tone. Coordination normal.  Skin: Skin is warm and dry. No rash noted. No erythema. No pallor.  Psychiatric: She has a normal mood and affect. Her behavior is normal. Judgment and thought content normal.  Nursing note and vitals reviewed.   ED Course  Procedures (including critical care time) DIAGNOSTIC STUDIES: Oxygen Saturation is 98% on RA, nl by my interpretation.    COORDINATION OF  CARE: 3:18 AM-Discussed treatment plan which includes an ultrasound and pain meds (tylenol) with pt at bedside and pt agreed to plan.   Labs Review Labs Reviewed  WET PREP, GENITAL - Abnormal; Notable for the following:    Clue Cells Wet Prep HPF POC FEW (*)    WBC, Wet Prep HPF POC FEW (*)    All other components within normal limits  HCG, QUANTITATIVE, PREGNANCY - Abnormal; Notable for the following:    hCG, Beta Chain, Quant, [place] [id number] (*)    All other components within normal limits  CBC  HIV ANTIBODY (ROUTINE TESTING)  RPR  ABO/RH  TYPE AND SCREEN  GC/CHLAMYDIA PROBE AMP ([place])    Imaging Review [place] Ob Comp Less 14 Wks  10/19/2014   CLINICAL DATA:  Pelvic pain, vaginal bleeding. Pregnant patient. Beta HCG 35,867  EXAM: OBSTETRIC <14 WK US AND TRANSVAGINAL OB  US DOPPLER ULTRASOUND OF OVARIES  TECHNIQUE: Both transabdominal and transvaginal ultrasound examinations were performed for complete evaluation of the gestation as well as the maternal uterus, adnexal regions, and pelvic cul-de-sac. Transvaginal technique was performed to assess early pregnancy.  Color and duplex Doppler ultrasound was utilized to evaluate blood flow to the ovaries.  COMPARISON:  None.  FINDINGS: Intrauterine gestational sac: Visualized.  Yolk sac:  Present.  Embryo:  Not present.  Cardiac Activity: Not present.  MSD: 17.1  mm   6 w   4  d              [place] EDC: 06/10/2015  Maternal uterus/adnexae: There is a large right ovarian cyst nearly completely replacing the right ovarian parenchyma. The ovary measures 10.6 x 6.6 x 8.9 cm of the cyst measures 9.9 x 5.6 x 8.0 cm. There is blood flow to the adjacent ovarian parenchyma. The left ovary measures 4.6 x 2.5 x 2.4 cm and contains a 2.6 x 2.2 x 1.8 cm cyst, this may reflect corpus luteum. Normal blood flow to the left ovary. There is trace pelvic free fluid.  Pulsed Doppler evaluation of the right ovarian parenchyma demonstrates normal appearing low-resistance  arterial and venous waveforms.  IMPRESSION: 1. Intrauterine gestational sac with yolk sac, no fetal pole. Estimated gestational age based on mean sac diameter of 6 weeks 4 days for estimated date of delivery 06/10/2015. Mean sac diameter of 17.1 mm, findings are suspicious but not yet definitive for failed pregnancy. Recommend follow-up US in 10-14 days for definitive diagnosis. This recommendation follows SRU consensus guidelines: Diagnostic Criteria for Nonviable Pregnancy Early in the First Trimester. Malva Limes Med 2013; 409:8119-14. 2. Large right ovarian cyst nearly completely replacing the ovarian parenchyma. Cyst measures 9.9 cm in greatest dimension. There is normal blood flow to the adjacent ovarian parenchyma. 3. Cyst in the left ovary measures 2.6 cm, may reflect a corpus luteum.   Electronically Signed   By: Rubye Oaks M.D.   On: 10/19/2014 06:36   US Ob Transvaginal  10/19/2014   CLINICAL DATA:  Pelvic pain, vaginal bleeding. Pregnant patient. Beta HCG 35,867  EXAM: OBSTETRIC <14 WK [place] AND TRANSVAGINAL OB  US DOPPLER ULTRASOUND OF OVARIES  TECHNIQUE: Both transabdominal and transvaginal ultrasound examinations were performed for complete evaluation of the gestation as well as the maternal uterus, adnexal regions, and pelvic cul-de-sac. Transvaginal technique was performed to assess early pregnancy.  Color and duplex Doppler ultrasound was utilized to evaluate blood flow to the ovaries.  COMPARISON:  None.  FINDINGS: Intrauterine gestational sac: Visualized.  Yolk sac:  Present.  Embryo:  Not present.  Cardiac Activity: Not present.  MSD: 17.1  mm   6 w   4  d              [place] EDC: 06/10/2015  Maternal uterus/adnexae: There is a large right ovarian cyst nearly completely replacing the right ovarian parenchyma. The ovary measures 10.6 x 6.6 x 8.9 cm of the cyst measures 9.9 x 5.6 x 8.0 cm. There is blood flow to the adjacent ovarian parenchyma. The left ovary measures 4.6 x 2.5 x 2.4 cm and  contains a 2.6 x 2.2 x 1.8 cm cyst, this may reflect corpus luteum. Normal blood flow to the left ovary. There is trace pelvic free fluid.  Pulsed Doppler evaluation of the right ovarian parenchyma demonstrates normal appearing low-resistance arterial and venous waveforms.  IMPRESSION: 1. Intrauterine gestational sac with yolk sac, no fetal  pole. Estimated gestational age based on mean sac diameter of 6 weeks 4 days for estimated date of delivery 06/10/2015. Mean sac diameter of 17.1 mm, findings are suspicious but not yet definitive for failed pregnancy. Recommend follow-up US in 10-14 days for definitive diagnosis. This recommendation follows SRU consensus guidelines: Diagnostic Criteria for Nonviable Pregnancy Early in the First Trimester. Malva Limes Med 2013; 295:1884-16. 2. Large right ovarian cyst nearly completely replacing the ovarian parenchyma. Cyst measures 9.9 cm in greatest dimension. There is normal blood flow to the adjacent ovarian parenchyma. 3. Cyst in the left ovary measures 2.6 cm, may reflect a corpus luteum.   Electronically Signed   By: Rubye Oaks M.D.   On: 10/19/2014 06:36   [place] Art/ven Flow Abd Pelv Doppler Limited  10/19/2014   CLINICAL DATA:  Pelvic pain, vaginal bleeding. Pregnant patient. Beta HCG 35,867  EXAM: OBSTETRIC <14 WK [place] AND TRANSVAGINAL OB  US DOPPLER ULTRASOUND OF OVARIES  TECHNIQUE: Both transabdominal and transvaginal ultrasound examinations were performed for complete evaluation of the gestation as well as the maternal uterus, adnexal regions, and pelvic cul-de-sac. Transvaginal technique was performed to assess early pregnancy.  Color and duplex Doppler ultrasound was utilized to evaluate blood flow to the ovaries.  COMPARISON:  None.  FINDINGS: Intrauterine gestational sac: Visualized.  Yolk sac:  Present.  Embryo:  Not present.  Cardiac Activity: Not present.  MSD: 17.1  mm   6 w   4  d              [place] EDC: 06/10/2015  Maternal uterus/adnexae: There is a large  right ovarian cyst nearly completely replacing the right ovarian parenchyma. The ovary measures 10.6 x 6.6 x 8.9 cm of the cyst measures 9.9 x 5.6 x 8.0 cm. There is blood flow to the adjacent ovarian parenchyma. The left ovary measures 4.6 x 2.5 x 2.4 cm and contains a 2.6 x 2.2 x 1.8 cm cyst, this may reflect corpus luteum. Normal blood flow to the left ovary. There is trace pelvic free fluid.  Pulsed Doppler evaluation of the right ovarian parenchyma demonstrates normal appearing low-resistance arterial and venous waveforms.  IMPRESSION: 1. Intrauterine gestational sac with yolk sac, no fetal pole. Estimated gestational age based on mean sac diameter of 6 weeks 4 days for estimated date of delivery 06/10/2015. Mean sac diameter of 17.1 mm, findings are suspicious but not yet definitive for failed pregnancy. Recommend follow-up US in 10-14 days for definitive diagnosis. This recommendation follows SRU consensus guidelines: Diagnostic Criteria for Nonviable Pregnancy Early in the First Trimester. Malva Limes Med 2013; 606:3016-01. 2. Large right ovarian cyst nearly completely replacing the ovarian parenchyma. Cyst measures 9.9 cm in greatest dimension. There is normal blood flow to the adjacent ovarian parenchyma. 3. Cyst in the left ovary measures 2.6 cm, may reflect a corpus luteum.   Electronically Signed   By: Rubye Oaks M.D.   On: 10/19/2014 06:36     EKG Interpretation None      MDM   Final diagnoses:  Pelvic pain affecting pregnancy  Right ovarian cyst  Threatened miscarriage in early pregnancy   24 year old female 5 weeks and 6 days by LMP who presents with pelvic pain and vaginal bleeding.  Ultrasound shows 6 weeks and 4 days.  Intrauterine sac noted, no signs of ectopic.  Patient does have a large 9 cm cyst to right ovary.  Patient updated on findings and plan.  Patient instructed [facility] if she has further problems over the weekend.  She does have an OB, who  is ready to see her when she reaches 8 weeks.  I personally performed the services described in this documentation, which was scribed in my presence. The recorded information has been reviewed and is accurate.     Jelicia Mackie, MD 10/19/14 [id number]

## 2014-10-19 NOTE — ED Notes (Signed)
Pelvic cart at bedside. 

## 2014-10-19 NOTE — ED Notes (Signed)
Pt. Has questions for Dr. Norlene Campbelltter. Dr. Norlene Campbelltter will be into talk with pt.

## 2014-10-19 NOTE — Discharge Instructions (Signed)
Your ultrasound today showed an early pregnancy, no heartbeat seen yet.  You have a large 9 cm cyst to your right ovary.  It is [facility] that [facility], in 2-3 days for recheck of your hCG Quant and ultrasound.  If you have any further cramping or bleeding, it is best to follow-up in the MAU.  Continue taking prenatal vitamin.  Tylenol as needed for pain.  Your bacterial vaginosis infection is improving.   Ovarian Cyst An ovarian cyst is a fluid-filled sac that forms on an ovary. The ovaries are small organs that produce eggs in women. Various types of cysts can form on the ovaries. Most are not cancerous. Many do not cause problems, and they often go away on their own. Some may cause symptoms and require treatment. Common types of ovarian cysts include:  Functional cysts--These cysts may occur every month during the menstrual cycle. This is normal. The cysts usually go away with the next menstrual cycle if the woman does not get pregnant. Usually, there are no symptoms with a functional cyst.  Endometrioma cysts--These cysts form from the tissue that lines the uterus. They are also called "chocolate cysts" because they become filled with blood that turns brown. This type of cyst can cause pain in the lower abdomen during intercourse and with your menstrual period.  Cystadenoma cysts--This type develops from the cells on the outside of the ovary. These cysts can get very big and cause lower abdomen pain and pain with intercourse. This type of cyst can twist on itself, cut off its blood supply, and cause severe pain. It can also easily rupture and cause a lot of pain.  Dermoid cysts--This type of cyst is sometimes found in both ovaries. These cysts may contain different kinds of body tissue, such as skin, teeth, hair, or cartilage. They usually do not cause symptoms unless they get very big.  Theca lutein cysts--These cysts occur when too much of a certain hormone  (human chorionic gonadotropin) is produced and overstimulates the ovaries to produce an egg. This is most common after procedures used to assist with the conception of a baby (in vitro fertilization). CAUSES   Fertility drugs can cause a condition in which multiple large cysts are formed on the ovaries. This is called ovarian hyperstimulation syndrome.  A condition called polycystic ovary syndrome can cause hormonal imbalances that can lead to nonfunctional ovarian cysts. SIGNS AND SYMPTOMS  Many ovarian cysts do not cause symptoms. If symptoms are present, they may include:  Pelvic pain or pressure.  Pain in the lower abdomen.  Pain during sexual intercourse.  Increasing girth (swelling) of the abdomen.  Abnormal menstrual periods.  Increasing pain with menstrual periods.  Stopping having menstrual periods without being pregnant. DIAGNOSIS  These cysts are commonly found during a routine or annual pelvic exam. Tests may be ordered to find out more about the cyst. These tests may include:  Ultrasound.  X-ray of the pelvis.  CT scan.  MRI.  Blood tests. TREATMENT  Many ovarian cysts go away on their own without treatment. Your health care provider may want to check your cyst regularly for 2-3 months to see if it changes. For women in menopause, it is particularly important to monitor a cyst closely because of the higher rate of ovarian cancer in menopausal women. When treatment is needed, it may include any of the following:  A procedure to drain the cyst (aspiration). This may be done using a long  needle and ultrasound. It can also be done through a laparoscopic procedure. This involves using a thin, lighted tube with a tiny camera on the end (laparoscope) inserted through a small incision.  Surgery to remove the whole cyst. This may be done using laparoscopic surgery or an open surgery involving a larger incision in the lower abdomen.  Hormone treatment or birth control  pills. These methods are sometimes used to help dissolve a cyst. HOME CARE INSTRUCTIONS   Only take over-the-counter or prescription medicines as directed by your health care provider.  Follow up with your health care provider as directed.  Get regular pelvic exams and Pap tests. SEEK MEDICAL CARE IF:   Your periods are late, irregular, or painful, or they stop.  Your pelvic pain or abdominal pain does not go away.  Your abdomen becomes larger or swollen.  You have pressure on your bladder or trouble emptying your bladder completely.  You have pain during sexual intercourse.  You have feelings of fullness, pressure, or discomfort in your stomach.  You lose weight for no apparent reason.  You feel generally ill.  You become constipated.  You lose your appetite.  You develop acne.  You have an increase in body and facial hair.  You are gaining weight, without changing your exercise and eating habits.  You think you are pregnant. SEEK IMMEDIATE MEDICAL CARE IF:   You have increasing abdominal pain.  You feel sick to your stomach (nauseous), and you throw up (vomit).  You develop a fever that comes on suddenly.  You have abdominal pain during a bowel movement.  Your menstrual periods become heavier than usual. MAKE SURE YOU:  Understand these instructions.  Will watch your condition.  Will get help right away if you are not doing well or get worse. Document Released: 08/09/2005 Document Revised: 08/14/2013 Document Reviewed: 04/16/2013 [facility] Patient Information 2015 [place], [place]. This information is not intended to replace advice given to you by your health care provider. Make sure you discuss any questions you have with your health care provider.  Threatened Miscarriage A threatened miscarriage occurs when you have vaginal bleeding during your first 20 weeks of pregnancy but the pregnancy has not ended. If you have vaginal bleeding during this time, your  health care provider will do tests to make sure you are still pregnant. If the tests show you are still pregnant and the developing baby (fetus) inside your womb (uterus) is still growing, your condition is considered a threatened miscarriage. A threatened miscarriage does not mean your pregnancy will end, but it does increase the risk of losing your pregnancy (complete miscarriage). CAUSES  The cause of a threatened miscarriage is usually not known. If you go on to have a complete miscarriage, the most common cause is an abnormal number of chromosomes in the developing baby. Chromosomes are the structures inside cells that hold all your genetic material. Some causes of vaginal bleeding that do not result in miscarriage include:  Having sex.  Having an infection.  Normal hormone changes of pregnancy.  Bleeding that occurs when an egg implants in your uterus. RISK FACTORS Risk factors for bleeding in early pregnancy include:  Obesity.  Smoking.  Drinking excessive amounts of alcohol or caffeine.  Recreational drug use. SIGNS AND SYMPTOMS  Light vaginal bleeding.  Mild abdominal pain or cramps. DIAGNOSIS  If you have bleeding with or without abdominal pain before 20 weeks of pregnancy, your health care provider will do tests to check whether you  are still pregnant. One important test involves using sound waves and a computer (ultrasound) to create images of the inside of your uterus. Other tests include an internal exam of your vagina and uterus (pelvic exam) and measurement of your baby's heart rate.  You may be diagnosed with a threatened miscarriage if:  Ultrasound testing shows you are still pregnant.  Your baby's heart rate is strong.  A pelvic exam shows that the opening between your uterus and your vagina (cervix) is closed.  Your heart rate and blood pressure are stable.  Blood tests confirm you are still pregnant. TREATMENT  No treatments have been shown to prevent a  threatened miscarriage from going on to a complete miscarriage. However, the right home care is important.  HOME CARE INSTRUCTIONS   Make sure you keep all your appointments for prenatal care. This is very important.  Get plenty of rest.  Do not have sex or use tampons if you have vaginal bleeding.  Do not douche.  Do not smoke or use recreational drugs.  Do not drink alcohol.  Avoid caffeine. SEEK MEDICAL CARE IF:  You have light vaginal bleeding or spotting while pregnant.  You have abdominal pain or cramping.  You have a fever. SEEK IMMEDIATE MEDICAL CARE IF:  You have heavy vaginal bleeding.  You have blood clots coming from your vagina.  You have severe low back pain or abdominal cramps.  You have fever, chills, and severe abdominal pain. MAKE SURE YOU:  Understand these instructions.  Will watch your condition.  Will get help right away if you are not doing well or get worse. Document Released: 08/09/2005 Document Revised: 08/14/2013 Document Reviewed: 06/05/2013 [facility] Patient Information 2015 [place], [place]. This information is not intended to replace advice given to you by your health care provider. Make sure you discuss any questions you have with your health care provider.

## 2014-10-20 LAB — RPR: RPR Ser Ql: NONREACTIVE

## 2014-10-20 LAB — HIV ANTIBODY (ROUTINE TESTING W REFLEX): HIV Screen 4th Generation wRfx: NONREACTIVE

## 2014-10-21 LAB — GC/CHLAMYDIA PROBE AMP (~~LOC~~) NOT AT ARMC
CHLAMYDIA, DNA PROBE: NEGATIVE
Neisseria Gonorrhea: NEGATIVE

## 2014-10-23 ENCOUNTER — Encounter (HOSPITAL_COMMUNITY): Payer: Self-pay | Admitting: *Deleted

## 2014-10-23 ENCOUNTER — Inpatient Hospital Stay (HOSPITAL_COMMUNITY)
Admission: AD | Admit: 2014-10-23 | Discharge: 2014-10-23 | Disposition: A | Discharge status: Home / Self Care | Payer: Medicaid Other | Source: Ambulatory Visit | Attending: Obstetrics | Admitting: Obstetrics

## 2014-10-23 ENCOUNTER — Inpatient Hospital Stay (HOSPITAL_COMMUNITY): Payer: Medicaid Other

## 2014-10-23 DIAGNOSIS — Z3A01 Less than 8 weeks gestation of pregnancy: Secondary | ICD-10-CM | POA: Diagnosis not present

## 2014-10-23 DIAGNOSIS — N831 Corpus luteum cyst: Secondary | ICD-10-CM | POA: Diagnosis not present

## 2014-10-23 DIAGNOSIS — N83209 Unspecified ovarian cyst, unspecified side: Secondary | ICD-10-CM

## 2014-10-23 DIAGNOSIS — O26851 Spotting complicating pregnancy, first trimester: Secondary | ICD-10-CM

## 2014-10-23 DIAGNOSIS — O3481 Maternal care for other abnormalities of pelvic organs, first trimester: Secondary | ICD-10-CM | POA: Insufficient documentation

## 2014-10-23 DIAGNOSIS — R109 Unspecified abdominal pain: Secondary | ICD-10-CM | POA: Diagnosis present

## 2014-10-23 DIAGNOSIS — O26859 Spotting complicating pregnancy, unspecified trimester: Secondary | ICD-10-CM

## 2014-10-23 DIAGNOSIS — Z87891 Personal history of nicotine dependence: Secondary | ICD-10-CM | POA: Diagnosis not present

## 2014-10-23 DIAGNOSIS — N8320 Unspecified ovarian cysts: Secondary | ICD-10-CM

## 2014-10-23 LAB — URINALYSIS, ROUTINE W REFLEX MICROSCOPIC
Bilirubin Urine: NEGATIVE
Glucose, UA: NEGATIVE mg/dL
Hgb urine dipstick: NEGATIVE
KETONES UR: NEGATIVE mg/dL
NITRITE: NEGATIVE
PH: 6 (ref 5.0–8.0)
Protein, ur: NEGATIVE mg/dL
Specific Gravity, Urine: 1.02 (ref 1.005–1.030)
Urobilinogen, UA: 0.2 mg/dL (ref 0.0–1.0)

## 2014-10-23 LAB — CBC
HCT: 39.1 % (ref 36.0–46.0)
Hemoglobin: 13.7 g/dL (ref 12.0–15.0)
MCH: 31.1 pg (ref 26.0–34.0)
MCHC: 35 g/dL (ref 30.0–36.0)
MCV: 88.9 fL (ref 78.0–100.0)
PLATELETS: 252 10*3/uL (ref 150–400)
RBC: 4.4 MIL/uL (ref 3.87–5.11)
RDW: 13.6 % (ref 11.5–15.5)
WBC: 5.6 10*3/uL (ref 4.0–10.5)

## 2014-10-23 LAB — URINE MICROSCOPIC-ADD ON

## 2014-10-23 LAB — HCG, QUANTITATIVE, PREGNANCY: hCG, Beta Chain, Quant, S: 65047 m[IU]/mL — ABNORMAL HIGH (ref <5)

## 2014-10-23 MED ORDER — ACETAMINOPHEN-CODEINE #3 300-30 MG PO TABS: 1.0000 | ORAL_TABLET | Freq: Four times a day (QID) | ORAL | Status: DC | PRN

## 2014-10-23 MED ORDER — LACTATED RINGERS IV BOLUS (SEPSIS)
1000.0000 mL | Freq: Once | INTRAVENOUS | Status: AC
  Administered 2014-10-23: 1000 mL via INTRAVENOUS

## 2014-10-23 NOTE — MAU Provider Note (Signed)
History     CSN: [id number]  Arrival date and time: 10/23/14 1034   None     Chief Complaint  Patient presents with  . Abdominal Pain   HPI   Ms. Veronica Knight is a 24 y.o. female G2P0010 at [redacted]w[redacted]d who presents with abdominal pain, she was seen at [facility] ED recently and US showed a 9 cm Ovarian cyst. [place] also showed a IGS with yolk sac, however given the size it was documented at questionable failed pregnancy.  The pain has worsened in the last 24 hours on the right side of her abdomen.  It went from a shooting pain to a constant pain.   OB History    Gravida Para Term Preterm AB TAB SAB Ectopic Multiple Living   2    1           Past Medical History  Diagnosis Date  . Irregular heart beat 07/06/11  . UTI (lower urinary tract infection)   . Heart murmur   . PFO (patent foramen ovale)   . Abortion     Past Surgical History  Procedure Laterality Date  . No past surgeries    . Induced abortion      Family History  Problem Relation Age of Onset  . Diabetes Mellitus II Other   . Breast cancer Other   . CAD Neg Hx   . Stroke Neg Hx     History  Substance Use Topics  . Smoking status: Former Smoker -- 0.50 packs/day    Types: Cigarettes    Quit date: 10/20/2014  . Smokeless tobacco: Never Used  . Alcohol Use: Yes     Comment: not drinking since she found out she was pregnant    Allergies:  Allergies  Allergen Reactions  . Cherry Anaphylaxis, Swelling and Other (See Comments)    Childhood allergy; reaction unknown, tongue swelling. 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    Prescriptions prior to admission  Medication Sig Dispense Refill Last Dose  . acetaminophen (TYLENOL) 325 MG tablet Take 650 mg by mouth every 6 (six) hours as needed for mild pain or moderate pain.   10/20/2014  . metroNIDAZOLE (FLAGYL) 500 MG tablet Take 1 tablet (500 mg total) by mouth 2 (two) times daily. 28 tablet 0 10/23/2014 at Unknown time  . Prenatal Vit-Fe  Fumarate-FA (PRENATAL COMPLETE) 14-0.4 MG TABS Take 1 tablet by mouth daily. 60 each 1 10/23/2014 at Unknown time  . albuterol (PROVENTIL HFA;VENTOLIN HFA) 108 (90 BASE) MCG/ACT inhaler Inhale 2 puffs into the lungs every 6 (six) hours as needed for wheezing or shortness of breath.   emergency  . diphenhydrAMINE (BENADRYL) 25 MG tablet Take 1 tablet (25 mg total) by mouth every 6 (six) hours. (Patient not taking: Reported on 10/23/2014) 20 tablet 0   . diphenoxylate-atropine (LOMOTIL) 2.5-0.025 MG per tablet Take 1 tablet by mouth 4 (four) times daily as needed for diarrhea or loose stools. (Patient not taking: Reported on 10/23/2014) 16 tablet 0   . loratadine (CLARITIN) 10 MG tablet Take 1 tablet (10 mg total) by mouth daily. (Patient not taking: Reported on 10/23/2014) 14 tablet 0   . nitrofurantoin, macrocrystal-monohydrate, (MACROBID) 100 MG capsule Take 1 capsule (100 mg total) by mouth 2 (two) times daily. (Patient not taking: Reported on 10/23/2014) 14 capsule 0 Completed Course at Unknown time  . ondansetron (ZOFRAN ODT) 8 MG disintegrating tablet Take 1 tablet (8 mg total) by mouth every 8 (eight)  hours as needed for nausea. (Patient not taking: Reported on 10/23/2014) 12 tablet 0 Unknown at Unknown time  . permethrin (ELIMITE) 5 % cream Apply to affected area once (Patient not taking: Reported on 10/23/2014) 60 g 0   . predniSONE (DELTASONE) 10 MG tablet Take 2 tablets (20 mg total) by mouth daily. (Patient not taking: Reported on 10/23/2014) 21 tablet 0    Results for orders placed or performed during the hospital encounter of 10/23/14 (from the past 48 hour(s))  Urinalysis, Routine w reflex microscopic     Status: Abnormal   Collection Time: 10/23/14 11:01 AM  Result Value Ref Range   Color, Urine YELLOW YELLOW   APPearance CLEAR CLEAR   Specific Gravity, Urine 1.020 1.005 - 1.030   pH 6.0 5.0 - 8.0   Glucose, UA NEGATIVE NEGATIVE mg/dL   Hgb urine dipstick NEGATIVE NEGATIVE   Bilirubin Urine  NEGATIVE NEGATIVE   Ketones, ur NEGATIVE NEGATIVE mg/dL   Protein, ur NEGATIVE NEGATIVE mg/dL   Urobilinogen, UA 0.2 0.0 - 1.0 mg/dL   Nitrite NEGATIVE NEGATIVE   Leukocytes, UA TRACE (A) NEGATIVE  Urine microscopic-add on     Status: None   Collection Time: 10/23/14 11:01 AM  Result Value Ref Range   Squamous Epithelial / LPF RARE RARE   RBC / HPF 0-2 <3 RBC/hpf  CBC     Status: None   Collection Time: 10/23/14 11:55 AM  Result Value Ref Range   WBC 5.6 4.0 - 10.5 K/uL   RBC 4.40 3.87 - 5.11 MIL/uL   Hemoglobin 13.7 12.0 - 15.0 g/dL   HCT 16.139.1 09.636.0 - 04.546.0 %   MCV 88.9 78.0 - 100.0 fL   MCH 31.1 26.0 - 34.0 pg   MCHC 35.0 30.0 - 36.0 g/dL   RDW 40.913.6 81.111.5 - 91.415.5 %   Platelets 252 150 - 400 K/uL  hCG, quantitative, pregnancy     Status: Abnormal   Collection Time: 10/23/14 11:55 AM  Result Value Ref Range   hCG, Beta Chain, Quant, S 65047 (H) <5 mIU/mL    Comment:          GEST. AGE      CONC.  (mIU/mL)   <=1 WEEK        5 - 50     2 WEEKS       50 - 500     3 WEEKS       100 - 10,000     4 WEEKS     1,000 - 30,000     5 WEEKS     3,500 - 115,000   6-8 WEEKS     12,000 - 270,000    12 WEEKS     15,000 - 220,000        FEMALE AND NON-PREGNANT FEMALE:     LESS THAN 5 mIU/mL     [place] Ob Transvaginal  10/23/2014   CLINICAL DATA:  Spotting.  Evaluate right ovary cysts.  EXAM: OBSTETRIC <14 WK US AND TRANSVAGINAL OB  US DOPPLER ULTRASOUND OF OVARIES  TECHNIQUE: Transvaginal ultrasound examinations were performed for complete evaluation of the gestation as well as the maternal uterus, adnexal regions, and pelvic cul-de-sac. Transvaginal technique was performed to assess early pregnancy.  Color and duplex Doppler ultrasound was utilized to evaluate blood flow to the ovaries.  COMPARISON:  None.  FINDINGS: Intrauterine gestational sac: Single  Yolk sac:  Yes  Embryo:  Yes  Cardiac Activity: Yes  Heart Rate: 121 bpm  CRL:   4.3   mm   6 w 1 d                  [place] EDC: 06/17/2015  Maternal  uterus/adnexae:  Subchorionic hemorrhage: None  Right ovary: Simple appearing cyst measures 11.1 x 5.8 x 8.0 cm. Previously this measured 9.9 x 5.6 x 8.0 cm.  Left ovary: Corpus luteal cyst noted measuring 2.5 x 1.8 x 2.0 cm.  Other :None  Free fluid:  Trace free fluid  Pulsed Doppler evaluation of both ovaries demonstrates normal appearing low-resistance arterial and venous waveforms.  IMPRESSION: 1. Single living intrauterine gestation with an estimated gestational age of [redacted] weeks and 1 day. 2. Persistent large right ovarian cyst. This measures 11 cm in greatest dimension, previously 10 cm. 3. There is normal blood flow to both ovaries.   Electronically Signed   By: Signa Kell M.D.   On: 10/23/2014 12:26   [place] Art/ven Flow Abd Pelv Doppler  10/23/2014   CLINICAL DATA:  Spotting.  Evaluate right ovary cysts.  EXAM: OBSTETRIC <14 WK [place] AND TRANSVAGINAL OB  US DOPPLER ULTRASOUND OF OVARIES  TECHNIQUE: Transvaginal ultrasound examinations were performed for complete evaluation of the gestation as well as the maternal uterus, adnexal regions, and pelvic cul-de-sac. Transvaginal technique was performed to assess early pregnancy.  Color and duplex Doppler ultrasound was utilized to evaluate blood flow to the ovaries.  COMPARISON:  None.  FINDINGS: Intrauterine gestational sac: Single  Yolk sac:  Yes  Embryo:  Yes  Cardiac Activity: Yes  Heart Rate: 121 bpm  CRL:   4.3   mm   6 w 1 d                  [place] EDC: 06/17/2015  Maternal uterus/adnexae:  Subchorionic hemorrhage: None  Right ovary: Simple appearing cyst measures 11.1 x 5.8 x 8.0 cm. Previously this measured 9.9 x 5.6 x 8.0 cm.  Left ovary: Corpus luteal cyst noted measuring 2.5 x 1.8 x 2.0 cm.  Other :None  Free fluid:  Trace free fluid  Pulsed Doppler evaluation of both ovaries demonstrates normal appearing low-resistance arterial and venous waveforms.  IMPRESSION: 1. Single living intrauterine gestation with an estimated gestational age of [redacted] weeks and 1 day.  2. Persistent large right ovarian cyst. This measures 11 cm in greatest dimension, previously 10 cm. 3. There is normal blood flow to both ovaries.   Electronically Signed   By: Signa Kell M.D.   On: 10/23/2014 12:26    Review of Systems  Constitutional: Negative for fever and chills.  Gastrointestinal: Positive for nausea, vomiting and abdominal pain (worse on the right side ).   Physical Exam   Blood pressure 110/70, pulse 67, temperature 98.1 F (36.7 C), temperature source Oral, resp. rate 18, height 5' 7.72" (1.72 m), weight 63.504 kg (140 lb), last menstrual period 09/08/2014, unknown if currently breastfeeding.  Physical Exam  Constitutional: She is oriented to person, place, and time. She appears well-developed and well-nourished. No distress.  HENT:  Head: Normocephalic.  Eyes: Pupils are equal, round, and reactive to light.  Neck: Neck supple.  Respiratory: Effort normal. No respiratory distress. She has no wheezes. She has no rales.  GI: Soft. She exhibits no distension. There is tenderness in the right lower quadrant and suprapubic area. There is guarding. There is no rigidity and no rebound.  Genitourinary:  Bimanual exam Uterus enlarged Right adnexal tenderness  Fullness appreciated in the right  adnexa.   Musculoskeletal: Normal range of motion.  Neurological: She is alert and oriented to person, place, and time.  Skin: Skin is warm. She is not diaphoretic.  Psychiatric: Her behavior is normal.    MAU Course  Procedures  None  MDM Bimanual exam UA  CBC  Quant [place]   Assessment and Plan   A:  1. Cyst of ovary, right   2. Ovarian cyst   3. Spotting in early pregnancy    P:  Discharge home in stable condition RX: tylenol #3  Patient is scheduled with Dr. Gaynell Face this month and is encouraged to keep that appointment Pelvic rest Bleeding precautions If pain worsens, patient should present back to MAU.   Iona Hansen Sharona Rovner, NP 10/23/2014 4:04  PM

## 2014-10-23 NOTE — Discharge Instructions (Signed)
Ovarian Cyst An ovarian cyst is a fluid-filled sac that forms on an ovary. The ovaries are small organs that produce eggs in women. Various types of cysts can form on the ovaries. Most are not cancerous. Many do not cause problems, and they often go away on their own. Some may cause symptoms and require treatment. Common types of ovarian cysts include:  Functional cysts--These cysts may occur every month during the menstrual cycle. This is normal. The cysts usually go away with the next menstrual cycle if the woman does not get pregnant. Usually, there are no symptoms with a functional cyst.  Endometrioma cysts--These cysts form from the tissue that lines the uterus. They are also called "chocolate cysts" because they become filled with blood that turns brown. This type of cyst can cause pain in the lower abdomen during intercourse and with your menstrual period.  Cystadenoma cysts--This type develops from the cells on the outside of the ovary. These cysts can get very big and cause lower abdomen pain and pain with intercourse. This type of cyst can twist on itself, cut off its blood supply, and cause severe pain. It can also easily rupture and cause a lot of pain.  Dermoid cysts--This type of cyst is sometimes found in both ovaries. These cysts may contain different kinds of body tissue, such as skin, teeth, hair, or cartilage. They usually do not cause symptoms unless they get very big.  Theca lutein cysts--These cysts occur when too much of a certain hormone (human chorionic gonadotropin) is produced and overstimulates the ovaries to produce an egg. This is most common after procedures used to assist with the conception of a baby (in vitro fertilization). CAUSES   Fertility drugs can cause a condition in which multiple large cysts are formed on the ovaries. This is called ovarian hyperstimulation syndrome.  A condition called polycystic ovary syndrome can cause hormonal imbalances that can lead to  nonfunctional ovarian cysts. SIGNS AND SYMPTOMS  Many ovarian cysts do not cause symptoms. If symptoms are present, they may include:  Pelvic pain or pressure.  Pain in the lower abdomen.  Pain during sexual intercourse.  Increasing girth (swelling) of the abdomen.  Abnormal menstrual periods.  Increasing pain with menstrual periods.  Stopping having menstrual periods without being pregnant. DIAGNOSIS  These cysts are commonly found during a routine or annual pelvic exam. Tests may be ordered to find out more about the cyst. These tests may include:  Ultrasound.  X-ray of the pelvis.  CT scan.  MRI.  Blood tests. TREATMENT  Many ovarian cysts go away on their own without treatment. Your health care provider may want to check your cyst regularly for 2-3 months to see if it changes. For women in menopause, it is particularly important to monitor a cyst closely because of the higher rate of ovarian cancer in menopausal women. When treatment is needed, it may include any of the following:  A procedure to drain the cyst (aspiration). This may be done using a long needle and ultrasound. It can also be done through a laparoscopic procedure. This involves using a thin, lighted tube with a tiny camera on the end (laparoscope) inserted through a small incision.  Surgery to remove the whole cyst. This may be done using laparoscopic surgery or an open surgery involving a larger incision in the lower abdomen.  Hormone treatment or birth control pills. These methods are sometimes used to help dissolve a cyst. HOME CARE INSTRUCTIONS   Only take over-the-counter   or prescription medicines as directed by your health care provider.  Follow up with your health care provider as directed.  Get regular pelvic exams and Pap tests. SEEK MEDICAL CARE IF:   Your periods are late, irregular, or painful, or they stop.  Your pelvic pain or abdominal pain does not go away.  Your abdomen becomes  larger or swollen.  You have pressure on your bladder or trouble emptying your bladder completely.  You have pain during sexual intercourse.  You have feelings of fullness, pressure, or discomfort in your stomach.  You lose weight for no apparent reason.  You feel generally ill.  You become constipated.  You lose your appetite.  You develop acne.  You have an increase in body and facial hair.  You are gaining weight, without changing your exercise and eating habits.  You think you are pregnant. SEEK IMMEDIATE MEDICAL CARE IF:   You have increasing abdominal pain.  You feel sick to your stomach (nauseous), and you throw up (vomit).  You develop a fever that comes on suddenly.  You have abdominal pain during a bowel movement.  Your menstrual periods become heavier than usual. MAKE SURE YOU:  Understand these instructions.  Will watch your condition.  Will get help right away if you are not doing well or get worse. Document Released: 08/09/2005 Document Revised: 08/14/2013 Document Reviewed: 04/16/2013 Exit[employer] Patient Information 2015 [employer]. This information is not intended to replace advice given to you by your health care provider. Make sure you discuss any questions you have with your health care provider.  

## 2014-10-23 NOTE — MAU Note (Signed)
Pt presents to MAU with complaints of a brown vaginal discharge this morning with pain in her lower abdomen.

## 2014-11-26 ENCOUNTER — Other Ambulatory Visit (HOSPITAL_COMMUNITY): Payer: Self-pay | Admitting: Obstetrics

## 2014-11-26 DIAGNOSIS — Z3491 Encounter for supervision of normal pregnancy, unspecified, first trimester: Secondary | ICD-10-CM

## 2014-11-26 DIAGNOSIS — N83209 Unspecified ovarian cyst, unspecified side: Secondary | ICD-10-CM

## 2014-11-26 DIAGNOSIS — O3481 Maternal care for other abnormalities of pelvic organs, first trimester: Secondary | ICD-10-CM

## 2014-11-29 ENCOUNTER — Ambulatory Visit (HOSPITAL_COMMUNITY)
Admission: RE | Admit: 2014-11-29 | Discharge: 2014-11-29 | Disposition: A | Discharge status: Home / Self Care | Payer: Medicaid Other | Source: Ambulatory Visit | Attending: Obstetrics | Admitting: Obstetrics

## 2014-11-29 DIAGNOSIS — N8320 Unspecified ovarian cysts: Secondary | ICD-10-CM | POA: Insufficient documentation

## 2014-11-29 DIAGNOSIS — O3481 Maternal care for other abnormalities of pelvic organs, first trimester: Secondary | ICD-10-CM | POA: Diagnosis not present

## 2014-11-29 DIAGNOSIS — N83209 Unspecified ovarian cyst, unspecified side: Secondary | ICD-10-CM

## 2014-11-29 DIAGNOSIS — Z3491 Encounter for supervision of normal pregnancy, unspecified, first trimester: Secondary | ICD-10-CM | POA: Insufficient documentation

## 2014-12-13 ENCOUNTER — Encounter: Payer: Self-pay | Admitting: Cardiovascular Disease

## 2014-12-13 ENCOUNTER — Ambulatory Visit (INDEPENDENT_AMBULATORY_CARE_PROVIDER_SITE_OTHER): Payer: Medicaid Other | Admitting: Cardiovascular Disease

## 2014-12-13 DIAGNOSIS — R011 Cardiac murmur, unspecified: Secondary | ICD-10-CM

## 2014-12-13 NOTE — Patient Instructions (Signed)
Medication Instructions:  No new orders.  Labwork: No new orders.  Testing/Procedures: No new orders.  Follow-Up: Your physician recommends that you schedule a follow-up appointment as needed with Dr Elease HashimotoNahser.    Any Other Special Instructions Will Be Listed Below (If Applicable).

## 2014-12-13 NOTE — Progress Notes (Signed)
Cardiology Office Note   Date:  12/13/2014   ID:  Veronica Knight, DOB 04/18/1991, MRN [id number]  PCP:  PROVIDER NOT IN SYSTEM  Cardiologist:   Nahser, Deloris Ping, MD   Chief Complaint  Patient presents with  . Heart Murmur   Problem List 1. Heart murmur 2. Pregnancy    History of Present Illness: Veronica Knight is a 24 y.o. female who presents for evaluation of heart murmur and pregnancy She was referred by her OB for further evaluation of a possible congenital heart defect.  She's had a murmur throughout her life  but she's not had any limitations. She was pregnant last year and had an echo cardiographic, she was found have normal left trigger systolic function. She had trivial aortic insufficiency. There was no evidence of ASD. The echo was otherwise unremarkable.  She has quit smoking and drinking since learning that she is pregnant.    Past Medical History  Diagnosis Date  . Irregular heart beat 07/06/11  . UTI (lower urinary tract infection)   . Heart murmur   . PFO (patent foramen ovale)   . Abortion     Past Surgical History  Procedure Laterality Date  . No past surgeries    . Induced abortion       Current Outpatient Prescriptions  Medication Sig Dispense Refill  . Prenat-FeCbn-FeAsp-Meth-FA-DHA (PRENATE MINI) 18-0.6-0.4-350 MG CAPS Take 1 capsule by mouth daily.  11   No current facility-administered medications for this visit.    Allergies:   Cherry    Social History:  The patient  reports that she quit smoking about 7 weeks ago. Her smoking use included Cigarettes. She smoked 0.50 packs per day. She has never used smokeless tobacco. She reports that she drinks alcohol. She reports that she does not use illicit drugs.   Family History:  The patient's family history includes Breast cancer in her other; Diabetes Mellitus II in her other. There is no history of CAD or Stroke.    ROS:  Please see the history of present illness.    Review of  Systems: Constitutional:  denies fever, chills, diaphoresis, appetite change and fatigue.  HEENT: denies photophobia, eye pain, redness, hearing loss, ear pain, congestion, sore throat, rhinorrhea, sneezing, neck pain, neck stiffness and tinnitus.  Respiratory: denies SOB, DOE, cough, chest tightness, and wheezing.  Cardiovascular: denies chest pain, palpitations and leg swelling.  Gastrointestinal: denies nausea, vomiting, abdominal pain, diarrhea, constipation, blood in stool.  Genitourinary: denies dysuria, urgency, frequency, hematuria, flank pain and difficulty urinating.  Musculoskeletal: denies  myalgias, back pain, joint swelling, arthralgias and gait problem.   Skin: denies pallor, rash and wound.  Neurological: denies dizziness, seizures, syncope, weakness, light-headedness, numbness and headaches.   Hematological: denies adenopathy, easy bruising, personal or family bleeding history.  Psychiatric/ Behavioral: denies suicidal ideation, mood changes, confusion, nervousness, sleep disturbance and agitation.       All other systems are reviewed and negative.    PHYSICAL EXAM: VS:  BP 98/64 mmHg  Pulse 86  Ht  (1.702 m)  Wt 139 lb 12.8 oz (63.413 kg)  BMI 21.89 kg/m2  LMP 09/08/2014 , BMI Body mass index is 21.89 kg/(m^2). GEN: Well nourished, well developed, in no acute distress HEENT: normal Neck: no JVD, carotid bruits, or masses Cardiac: RRR; no murmurs, rubs, or gallops,no edema  Respiratory:  clear to auscultation bilaterally, normal work of breathing GI: soft, nontender, nondistended, + BS MS: no deformity or atrophy Skin: warm and dry,  no rash Neuro:  Strength and sensation are intact Psych: normal   EKG:  EKG is ordered today. The ekg ordered today demonstrates NSR at 86.  Normal ECG    Recent Labs: 10/09/2014: ALT 12; BUN 9; Creatinine 0.62; Potassium 4.1; Sodium 135 10/23/2014: Hemoglobin 13.7; Platelets 252    Lipid Panel No results found for: CHOL,  TRIG, HDL, CHOLHDL, VLDL, LDLCALC, LDLDIRECT    Wt Readings from Last 3 Encounters:  12/13/14 139 lb 12.8 oz (63.413 kg)  10/23/14 140 lb (63.504 kg)  11/05/13 143 lb 6.4 oz (65.046 kg)      Other studies Reviewed: Additional studies/ records that were reviewed today include: . Review of the above records demonstrates:    ASSESSMENT AND PLAN:  1.  PFO:  She was told that she had a patent foraminal [place] as a child. Her echocardiogram from 2015 did not show a PFO but it should be noted that it was a trans-thoracic study and she did not have a bubble study.  At this point she is pregnant and I would not recommend that we do a transesophageal echo with bubble study. She shouldn't have any complications with her heart with this current pregnancy. We will see her on an as-needed basis.  2. Trivial aortic insufficiency: This is benign.    Current medicines are reviewed at length with the patient today.  The patient does not have concerns regarding medicines.  The following changes have been made:  no change  Labs/ tests ordered today include:  No orders of the defined types were placed in this encounter.     Disposition:   FU with me as needed.     Signed, Nahser, Deloris PingPhilip J, MD  12/13/2014 4:01 PM    [facility] [address], [place], [place]  [address] Phone: [phone]; Fax: [phone]

## 2014-12-16 ENCOUNTER — Encounter (HOSPITAL_COMMUNITY): Payer: Self-pay

## 2014-12-16 ENCOUNTER — Inpatient Hospital Stay (HOSPITAL_COMMUNITY)
Admission: AD | Admit: 2014-12-16 | Discharge: 2014-12-16 | Disposition: A | Discharge status: Home / Self Care | Payer: Medicaid Other | Source: Ambulatory Visit | Attending: Obstetrics & Gynecology | Admitting: Obstetrics & Gynecology

## 2014-12-16 DIAGNOSIS — R21 Rash and other nonspecific skin eruption: Secondary | ICD-10-CM | POA: Insufficient documentation

## 2014-12-16 DIAGNOSIS — O219 Vomiting of pregnancy, unspecified: Secondary | ICD-10-CM

## 2014-12-16 DIAGNOSIS — R1110 Vomiting, unspecified: Secondary | ICD-10-CM | POA: Insufficient documentation

## 2014-12-16 DIAGNOSIS — K529 Noninfective gastroenteritis and colitis, unspecified: Secondary | ICD-10-CM | POA: Diagnosis not present

## 2014-12-16 DIAGNOSIS — R197 Diarrhea, unspecified: Secondary | ICD-10-CM | POA: Diagnosis not present

## 2014-12-16 LAB — URINALYSIS, ROUTINE W REFLEX MICROSCOPIC
Bilirubin Urine: NEGATIVE
Glucose, UA: NEGATIVE mg/dL
Hgb urine dipstick: NEGATIVE
KETONES UR: NEGATIVE mg/dL
LEUKOCYTES UA: NEGATIVE
Nitrite: NEGATIVE
Protein, ur: NEGATIVE mg/dL
SPECIFIC GRAVITY, URINE: 1.025 (ref 1.005–1.030)
Urobilinogen, UA: 0.2 mg/dL (ref 0.0–1.0)
pH: 7 (ref 5.0–8.0)

## 2014-12-16 MED ORDER — PROMETHAZINE HCL 12.5 MG PO TABS: 12.5000 mg | ORAL_TABLET | Freq: Four times a day (QID) | ORAL | Status: DC | PRN

## 2014-12-16 MED ORDER — ONDANSETRON 4 MG PO TBDP: 4.0000 mg | ORAL_TABLET | Freq: Once | ORAL | Status: DC

## 2014-12-16 MED ORDER — PROMETHAZINE HCL 25 MG PO TABS
25.0000 mg | ORAL_TABLET | Freq: Once | ORAL | Status: AC
  Administered 2014-12-16: 25 mg via ORAL
  Filled 2014-12-16: qty 1

## 2014-12-16 MED ORDER — FAMOTIDINE 20 MG PO TABS
20.0000 mg | ORAL_TABLET | Freq: Once | ORAL | Status: AC
  Administered 2014-12-16: 20 mg via ORAL
  Filled 2014-12-16: qty 1

## 2014-12-16 NOTE — MAU Note (Signed)
Thinks she has food poisoning.  Started vomiting and diarrhea 3 days ago after eating something- no one else is sick, but no one else ate it.   Also has a rash that has gotten worse since she was last seen, now on both arms and abd.

## 2014-12-16 NOTE — MAU Provider Note (Signed)
History     CSN: [id number]  Arrival date and time: 12/16/14 1243   First Provider Initiated Contact with Patient 12/16/14 1323      Chief Complaint  Patient presents with  . Emesis  . Diarrhea  . Rash   HPI   Veronica Knight is a 24 y.o. female G2P0010 at 7475w1d who presents to MAU with N/V/D. She ate some collard greens 3 days ago and found out that the collard greens may have been spoiled. She continued to have the symptoms throughout the day.  Yesterday her symptoms improved and she tried eating pulled barbacue chicken, ice cream, and green beans and vomited that up. This morning she ate a bowl of ice cream and water and vomited after that. She has vomited once today; she also had diarrhea once today. She has not tried anything else to eat or drink today.   She also complains of vaginal spotting: symptoms started in the last three days around the time she started vomiting. She noted brown discharge this morning when she wiped; no active blood noted. She has never had bleeding in this pregnancy.  She started seeing Dr. Gaynell FaceMarshall for prenatal care and was dismissed from his practice; she states that they do not have a good patient/ dr relationship. She states that she has tried every practice in [place] and no one will see her as a patient.   Prior to the patient being discharged home she mentions a rash that she has all over her body. She has one area on her left forearm that is worse than the other areas. The rash is itchy and flaky. She noticed the rash right when she found out she was pregnant, she feels it is pups. She has tried over the counter steroid cream without relief.   OB History    Gravida Para Term Preterm AB TAB SAB Ectopic Multiple Living   2    1           Past Medical History  Diagnosis Date  . Irregular heart beat 07/06/11  . UTI (lower urinary tract infection)   . PFO (patent foramen ovale)   . Abortion     Past Surgical History  Procedure  Laterality Date  . No past surgeries    . Induced abortion      Family History  Problem Relation Age of Onset  . Diabetes Mellitus II Other   . Breast cancer Other   . CAD Neg Hx   . Stroke Neg Hx     History  Substance Use Topics  . Smoking status: Former Smoker -- 0.50 packs/day    Types: Cigarettes    Quit date: 10/20/2014  . Smokeless tobacco: Never Used  . Alcohol Use: Yes     Comment: not drinking since she found out she was pregnant    Allergies:  Allergies  Allergen Reactions  . Cherry Anaphylaxis, Swelling and Other (See Comments)    Childhood allergy; reaction unknown, tongue swelling. 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    Prescriptions prior to admission  Medication Sig Dispense Refill Last Dose  . Prenat-FeCbn-FeAsp-Meth-FA-DHA (PRENATE MINI) 18-0.6-0.4-350 MG CAPS Take 1 capsule by mouth daily.  11 Taking   Results for orders placed or performed during the hospital encounter of 12/16/14 (from the past 48 hour(s))  Urinalysis, Routine w reflex microscopic     Status: None   Collection Time: 12/16/14  1:00 PM  Result Value Ref Range  Color, Urine YELLOW YELLOW   APPearance CLEAR CLEAR   Specific Gravity, Urine 1.025 1.005 - 1.030   pH 7.0 5.0 - 8.0   Glucose, UA NEGATIVE NEGATIVE mg/dL   Hgb urine dipstick NEGATIVE NEGATIVE   Bilirubin Urine NEGATIVE NEGATIVE   Ketones, ur NEGATIVE NEGATIVE mg/dL   Protein, ur NEGATIVE NEGATIVE mg/dL   Urobilinogen, UA 0.2 0.0 - 1.0 mg/dL   Nitrite NEGATIVE NEGATIVE   Leukocytes, UA NEGATIVE NEGATIVE    Comment: MICROSCOPIC NOT DONE ON URINES WITH NEGATIVE PROTEIN, BLOOD, LEUKOCYTES, NITRITE, OR GLUCOSE <1000 mg/dL.     Review of Systems  Constitutional: Negative for fever and chills.  Gastrointestinal: Positive for nausea, vomiting, abdominal pain and diarrhea.   Physical Exam   Last menstrual period 09/08/2014, unknown if currently breastfeeding.  Physical Exam  Constitutional: She is  oriented to person, place, and time. She appears well-developed and well-nourished. No distress.  HENT:  Head: Normocephalic.  Eyes: Pupils are equal, round, and reactive to light.  Neck: Neck supple.  Respiratory: Effort normal.  GI: Soft. Normal appearance. There is generalized tenderness. There is no rigidity, no rebound and no guarding.  Genitourinary:  Speculum exam: Vagina - Small amount of creamy, pale yellow discharge, no odor Cervix - No contact bleeding, ectropion noted  Bimanual exam: Cervix closed, no CMT  Uterus non tender, enlarged 12-14 weeks  Adnexa non tender, no masses bilaterally Patient declined STI testing  Chaperone present for exam.  Neurological: She is alert and oriented to person, place, and time.  Skin: Skin is warm. She is not diaphoretic.     Psychiatric: Her behavior is normal.    MAU Course  Procedures  None  MDM  + fetal heart tones Patient states she is unable to given a stool sample at this time.   UA shows no sign of dehydration   Called the HD to see if they could see the patient for prenatal care and they informed me that the next available appointment would be MAY 17; patient made aware to call and schedule   Assessment and Plan   A:  1. Gastroenteritis   2. Nausea/vomiting in pregnancy   3. Scaly patch rash     P:  Discharge home in stable condition RX: Phenergan Patient may need to see Derm for skin rash.  Start prenatal care ASAP  Small, frequent meals    Duane Lope, NP 12/16/2014 1:30 PM]

## 2015-01-09 ENCOUNTER — Inpatient Hospital Stay (HOSPITAL_COMMUNITY)
Admission: AD | Admit: 2015-01-09 | Discharge: 2015-01-09 | Disposition: A | Discharge status: Home / Self Care | Payer: Medicaid Other | Source: Ambulatory Visit | Attending: Obstetrics and Gynecology | Admitting: Obstetrics and Gynecology

## 2015-01-09 ENCOUNTER — Encounter (HOSPITAL_COMMUNITY): Payer: Self-pay

## 2015-01-09 DIAGNOSIS — R109 Unspecified abdominal pain: Secondary | ICD-10-CM | POA: Insufficient documentation

## 2015-01-09 DIAGNOSIS — M542 Cervicalgia: Secondary | ICD-10-CM | POA: Insufficient documentation

## 2015-01-09 DIAGNOSIS — O9989 Other specified diseases and conditions complicating pregnancy, childbirth and the puerperium: Secondary | ICD-10-CM | POA: Insufficient documentation

## 2015-01-09 DIAGNOSIS — Y92009 Unspecified place in unspecified non-institutional (private) residence as the place of occurrence of the external cause: Secondary | ICD-10-CM | POA: Diagnosis not present

## 2015-01-09 DIAGNOSIS — M25519 Pain in unspecified shoulder: Secondary | ICD-10-CM | POA: Diagnosis not present

## 2015-01-09 DIAGNOSIS — Z87891 Personal history of nicotine dependence: Secondary | ICD-10-CM | POA: Diagnosis not present

## 2015-01-09 DIAGNOSIS — Z3A17 17 weeks gestation of pregnancy: Secondary | ICD-10-CM | POA: Insufficient documentation

## 2015-01-09 MED ORDER — CYCLOBENZAPRINE HCL 5 MG PO TABS
5.0000 mg | ORAL_TABLET | Freq: Once | ORAL | Status: AC
  Administered 2015-01-09: 5 mg via ORAL
  Filled 2015-01-09: qty 1

## 2015-01-09 MED ORDER — CYCLOBENZAPRINE HCL 5 MG PO TABS: 5.0000 mg | ORAL_TABLET | Freq: Three times a day (TID) | ORAL | Status: DC | PRN
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
## 2015-01-09 NOTE — Discharge Instructions (Signed)
Second Trimester of Pregnancy The second trimester is from week 13 through week 28, month 4 through 6. This is often the time in pregnancy that you feel your best. Often times, morning sickness has lessened or quit. You may have more energy, and you may get hungry more often. Your unborn baby (fetus) is growing rapidly. At the end of the sixth month, he or she is about 9 inches long and weighs about 1 pounds. You will likely feel the baby move (quickening) between 18 and 20 weeks of pregnancy. HOME CARE   Avoid all smoking, herbs, and alcohol. Avoid drugs not approved by your doctor.  Only take medicine as told by your doctor. Some medicines are safe and some are not during pregnancy.  Exercise only as told by your doctor. Stop exercising if you start having cramps.  Eat regular, healthy meals.  Wear a good support bra if your breasts are tender.  Do not use hot tubs, steam rooms, or saunas.  Wear your seat belt when driving.  Avoid raw meat, uncooked cheese, and liter boxes and soil used by cats.  Take your prenatal vitamins.  Try taking medicine that helps you poop (stool softener) as needed, and if your doctor approves. Eat more fiber by eating fresh fruit, vegetables, and whole grains. Drink enough fluids to keep your pee (urine) clear or pale yellow.  Take warm water baths (sitz baths) to soothe pain or discomfort caused by hemorrhoids. Use hemorrhoid cream if your doctor approves.  If you have puffy, bulging veins (varicose veins), wear support hose. Raise (elevate) your feet for 15 minutes, 3-4 times a day. Limit salt in your diet.  Avoid heavy lifting, wear low heals, and sit up straight.  Rest with your legs raised if you have leg cramps or low back pain.  Visit your dentist if you have not gone during your pregnancy. Use a soft toothbrush to brush your teeth. Be gentle when you floss.  You can have sex (intercourse) unless your doctor tells you not to.  Go to your  doctor visits. GET HELP IF:   You feel dizzy.  You have mild cramps or pressure in your lower belly (abdomen).  You have a nagging pain in your belly area.  You continue to feel sick to your stomach (nauseous), throw up (vomit), or have watery poop (diarrhea).  You have bad smelling fluid coming from your vagina.  You have pain with peeing (urination). GET HELP RIGHT AWAY IF:   You have a fever.  You are leaking fluid from your vagina.  You have spotting or bleeding from your vagina.  You have severe belly cramping or pain.  You lose or gain weight rapidly.  You have trouble catching your breath and have chest pain.  You notice sudden or extreme puffiness (swelling) of your face, hands, ankles, feet, or legs.  You have not felt the baby move in over an hour.  You have severe headaches that do not go away with medicine.  You have vision changes. Document Released: 11/03/2009 Document Revised: 12/04/2012 Document Reviewed: 10/10/2012 ExitCare Patient Information 2015 [employer]. This information is not intended to replace advice given to you by your health care provider. Make sure you discuss any questions you have with your health care provider.  
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
## 2015-03-25 ENCOUNTER — Other Ambulatory Visit (HOSPITAL_COMMUNITY): Payer: Self-pay | Admitting: Obstetrics and Gynecology

## 2015-03-25 DIAGNOSIS — O365933 Maternal care for other known or suspected poor fetal growth, third trimester, fetus 3: Secondary | ICD-10-CM

## 2015-03-25 DIAGNOSIS — Z3689 Encounter for other specified antenatal screening: Secondary | ICD-10-CM

## 2015-03-25 DIAGNOSIS — Z3A28 28 weeks gestation of pregnancy: Secondary | ICD-10-CM

## 2015-03-25 LAB — US OB FOLLOW UP

## 2015-03-26 ENCOUNTER — Encounter (HOSPITAL_COMMUNITY): Payer: Self-pay

## 2015-03-26 ENCOUNTER — Ambulatory Visit (HOSPITAL_COMMUNITY)
Admission: RE | Admit: 2015-03-26 | Discharge: 2015-03-26 | Disposition: A | Discharge status: Home / Self Care | Payer: Medicaid Other | Source: Ambulatory Visit | Attending: Obstetrics and Gynecology | Admitting: Obstetrics and Gynecology

## 2015-03-26 DIAGNOSIS — O365933 Maternal care for other known or suspected poor fetal growth, third trimester, fetus 3: Secondary | ICD-10-CM

## 2015-03-26 DIAGNOSIS — O365930 Maternal care for other known or suspected poor fetal growth, third trimester, not applicable or unspecified: Secondary | ICD-10-CM | POA: Diagnosis present

## 2015-03-26 DIAGNOSIS — Z3A28 28 weeks gestation of pregnancy: Secondary | ICD-10-CM | POA: Insufficient documentation

## 2015-03-26 DIAGNOSIS — Z36 Encounter for antenatal screening of mother: Secondary | ICD-10-CM | POA: Diagnosis not present

## 2015-03-26 DIAGNOSIS — Z3689 Encounter for other specified antenatal screening: Secondary | ICD-10-CM

## 2015-03-27 ENCOUNTER — Other Ambulatory Visit (HOSPITAL_COMMUNITY): Payer: Self-pay | Admitting: Obstetrics and Gynecology

## 2015-03-27 ENCOUNTER — Encounter (HOSPITAL_COMMUNITY): Payer: Self-pay | Admitting: Obstetrics and Gynecology

## 2015-05-19 LAB — OB RESULTS CONSOLE GBS: STREP GROUP B AG: POSITIVE

## 2015-05-20 ENCOUNTER — Other Ambulatory Visit (HOSPITAL_COMMUNITY): Payer: Self-pay | Admitting: Obstetrics and Gynecology

## 2015-05-20 DIAGNOSIS — IMO0002 Reserved for concepts with insufficient information to code with codable children: Secondary | ICD-10-CM

## 2015-05-21 ENCOUNTER — Encounter (HOSPITAL_COMMUNITY): Payer: Self-pay

## 2015-05-21 ENCOUNTER — Ambulatory Visit (HOSPITAL_COMMUNITY)
Admission: RE | Admit: 2015-05-21 | Discharge: 2015-05-21 | Disposition: A | Discharge status: Home / Self Care | Payer: Medicaid Other | Source: Ambulatory Visit | Attending: Obstetrics and Gynecology | Admitting: Obstetrics and Gynecology

## 2015-05-21 DIAGNOSIS — IMO0002 Reserved for concepts with insufficient information to code with codable children: Secondary | ICD-10-CM

## 2015-05-21 DIAGNOSIS — O365990 Maternal care for other known or suspected poor fetal growth, unspecified trimester, not applicable or unspecified: Secondary | ICD-10-CM | POA: Insufficient documentation

## 2015-05-21 DIAGNOSIS — Z3A00 Weeks of gestation of pregnancy not specified: Secondary | ICD-10-CM | POA: Diagnosis not present

## 2015-05-23 ENCOUNTER — Encounter (HOSPITAL_COMMUNITY): Payer: Self-pay | Admitting: Obstetrics and Gynecology

## 2015-05-23 ENCOUNTER — Other Ambulatory Visit (HOSPITAL_COMMUNITY): Payer: Self-pay | Admitting: Obstetrics and Gynecology

## 2015-05-27 ENCOUNTER — Ambulatory Visit (HOSPITAL_COMMUNITY): Payer: Medicaid Other

## 2015-06-04 ENCOUNTER — Ambulatory Visit (HOSPITAL_COMMUNITY): Payer: Medicaid Other

## 2015-06-04 ENCOUNTER — Ambulatory Visit (HOSPITAL_COMMUNITY)
Admission: RE | Admit: 2015-06-04 | Discharge: 2015-06-04 | Disposition: A | Discharge status: Home / Self Care | Payer: Medicaid Other | Source: Ambulatory Visit | Attending: Obstetrics and Gynecology | Admitting: Obstetrics and Gynecology

## 2015-06-04 ENCOUNTER — Encounter (HOSPITAL_COMMUNITY): Payer: Self-pay

## 2015-06-04 ENCOUNTER — Other Ambulatory Visit (HOSPITAL_COMMUNITY): Payer: Self-pay | Admitting: Obstetrics and Gynecology

## 2015-06-04 DIAGNOSIS — Z3A38 38 weeks gestation of pregnancy: Secondary | ICD-10-CM | POA: Diagnosis not present

## 2015-06-04 DIAGNOSIS — O365930 Maternal care for other known or suspected poor fetal growth, third trimester, not applicable or unspecified: Secondary | ICD-10-CM

## 2015-06-04 DIAGNOSIS — IMO0002 Reserved for concepts with insufficient information to code with codable children: Secondary | ICD-10-CM

## 2015-06-05 ENCOUNTER — Encounter (HOSPITAL_COMMUNITY): Payer: Self-pay | Admitting: *Deleted

## 2015-06-05 ENCOUNTER — Telehealth (HOSPITAL_COMMUNITY): Payer: Self-pay | Admitting: *Deleted

## 2015-06-05 NOTE — Telephone Encounter (Signed)
Preadmission screen  

## 2015-06-06 ENCOUNTER — Other Ambulatory Visit: Payer: Self-pay | Admitting: Obstetrics and Gynecology

## 2015-06-08 ENCOUNTER — Encounter (HOSPITAL_COMMUNITY): Payer: Self-pay

## 2015-06-08 ENCOUNTER — Inpatient Hospital Stay (HOSPITAL_COMMUNITY)
Admission: RE | Admit: 2015-06-08 | Discharge: 2015-06-10 | DRG: 775 | Disposition: A | Discharge status: Home / Self Care | Payer: Medicaid Other | Source: Ambulatory Visit | Attending: Obstetrics and Gynecology | Admitting: Obstetrics and Gynecology

## 2015-06-08 DIAGNOSIS — O43123 Velamentous insertion of umbilical cord, third trimester: Secondary | ICD-10-CM | POA: Diagnosis present

## 2015-06-08 DIAGNOSIS — O365930 Maternal care for other known or suspected poor fetal growth, third trimester, not applicable or unspecified: Secondary | ICD-10-CM | POA: Diagnosis present

## 2015-06-08 DIAGNOSIS — O99824 Streptococcus B carrier state complicating childbirth: Secondary | ICD-10-CM | POA: Diagnosis present

## 2015-06-08 DIAGNOSIS — B951 Streptococcus, group B, as the cause of diseases classified elsewhere: Secondary | ICD-10-CM | POA: Diagnosis present

## 2015-06-08 DIAGNOSIS — Z87891 Personal history of nicotine dependence: Secondary | ICD-10-CM | POA: Diagnosis not present

## 2015-06-08 DIAGNOSIS — Z3A39 39 weeks gestation of pregnancy: Secondary | ICD-10-CM

## 2015-06-08 DIAGNOSIS — O43193 Other malformation of placenta, third trimester: Secondary | ICD-10-CM | POA: Diagnosis present

## 2015-06-08 DIAGNOSIS — Z833 Family history of diabetes mellitus: Secondary | ICD-10-CM | POA: Diagnosis not present

## 2015-06-08 DIAGNOSIS — IMO0002 Reserved for concepts with insufficient information to code with codable children: Secondary | ICD-10-CM | POA: Diagnosis present

## 2015-06-08 DIAGNOSIS — O4100X0 Oligohydramnios, unspecified trimester, not applicable or unspecified: Secondary | ICD-10-CM | POA: Diagnosis present

## 2015-06-08 LAB — CBC
HCT: 40.5 % (ref 36.0–46.0)
Hemoglobin: 14.2 g/dL (ref 12.0–15.0)
MCH: 31.7 pg (ref 26.0–34.0)
MCHC: 35.1 g/dL (ref 30.0–36.0)
MCV: 90.4 fL (ref 78.0–100.0)
Platelets: 201 10*3/uL (ref 150–400)
RBC: 4.48 MIL/uL (ref 3.87–5.11)
RDW: 14.1 % (ref 11.5–15.5)
WBC: 6.5 10*3/uL (ref 4.0–10.5)

## 2015-06-08 LAB — TYPE AND SCREEN
ABO/RH(D): O POS
Antibody Screen: NEGATIVE

## 2015-06-08 LAB — ABO/RH: ABO/RH(D): O POS

## 2015-06-08 LAB — RPR: RPR: NONREACTIVE

## 2015-06-08 MED ORDER — ACETAMINOPHEN 325 MG PO TABS: 650.0000 mg | ORAL_TABLET | ORAL | Status: DC | PRN

## 2015-06-08 MED ORDER — OXYCODONE-ACETAMINOPHEN 5-325 MG PO TABS: 1.0000 | ORAL_TABLET | ORAL | Status: DC | PRN

## 2015-06-08 MED ORDER — LACTATED RINGERS IV SOLN
INTRAVENOUS | Status: DC
  Administered 2015-06-08 (×2): via INTRAVENOUS

## 2015-06-08 MED ORDER — PENICILLIN G POTASSIUM 5000000 UNITS IJ SOLR
2.5000 10*6.[IU] | INTRAVENOUS | Status: DC
  Administered 2015-06-08 (×2): 2.5 10*6.[IU] via INTRAVENOUS
  Filled 2015-06-08 (×5): qty 2.5

## 2015-06-08 MED ORDER — PRENATAL MULTIVITAMIN CH
1.0000 | ORAL_TABLET | Freq: Every day | ORAL | Status: DC
  Filled 2015-06-08 (×3): qty 1

## 2015-06-08 MED ORDER — OXYCODONE-ACETAMINOPHEN 5-325 MG PO TABS: 2.0000 | ORAL_TABLET | ORAL | Status: DC | PRN

## 2015-06-08 MED ORDER — LACTATED RINGERS IV SOLN: 500.0000 mL | INTRAVENOUS | Status: DC | PRN

## 2015-06-08 MED ORDER — PENICILLIN G POTASSIUM 5000000 UNITS IJ SOLR
5.0000 10*6.[IU] | Freq: Once | INTRAVENOUS | Status: AC
  Administered 2015-06-08: 5 10*6.[IU] via INTRAVENOUS
  Filled 2015-06-08: qty 5

## 2015-06-08 MED ORDER — BENZOCAINE-MENTHOL 20-0.5 % EX AERO
1.0000 "application " | INHALATION_SPRAY | CUTANEOUS | Status: DC | PRN
  Administered 2015-06-09: 1 via TOPICAL
  Filled 2015-06-08 (×2): qty 56

## 2015-06-08 MED ORDER — CITRIC ACID-SODIUM CITRATE 334-500 MG/5ML PO SOLN: 30.0000 mL | ORAL | Status: DC | PRN

## 2015-06-08 MED ORDER — LIDOCAINE HCL (PF) 1 % IJ SOLN
30.0000 mL | INTRAMUSCULAR | Status: DC | PRN
  Administered 2015-06-08: 30 mL via SUBCUTANEOUS
  Filled 2015-06-08: qty 30

## 2015-06-08 MED ORDER — ZOLPIDEM TARTRATE 5 MG PO TABS: 5.0000 mg | ORAL_TABLET | Freq: Every evening | ORAL | Status: DC | PRN

## 2015-06-08 MED ORDER — OXYTOCIN BOLUS FROM INFUSION: 500.0000 mL | INTRAVENOUS | Status: DC

## 2015-06-08 MED ORDER — FENTANYL CITRATE (PF) 100 MCG/2ML IJ SOLN
100.0000 ug | INTRAMUSCULAR | Status: DC | PRN
Start: 2015-06-08 — End: 2015-06-08
  Administered 2015-06-08: 100 ug via INTRAVENOUS
  Filled 2015-06-08: qty 2

## 2015-06-08 MED ORDER — TERBUTALINE SULFATE 1 MG/ML IJ SOLN
0.2500 mg | Freq: Once | INTRAMUSCULAR | Status: DC | PRN
  Filled 2015-06-08: qty 1

## 2015-06-08 MED ORDER — SIMETHICONE 80 MG PO CHEW: 80.0000 mg | CHEWABLE_TABLET | ORAL | Status: DC | PRN

## 2015-06-08 MED ORDER — ONDANSETRON HCL 4 MG PO TABS: 4.0000 mg | ORAL_TABLET | ORAL | Status: DC | PRN

## 2015-06-08 MED ORDER — ONDANSETRON HCL 4 MG/2ML IJ SOLN: 4.0000 mg | Freq: Four times a day (QID) | INTRAMUSCULAR | Status: DC | PRN

## 2015-06-08 MED ORDER — BUTORPHANOL TARTRATE 1 MG/ML IJ SOLN
1.0000 mg | Freq: Once | INTRAMUSCULAR | Status: AC
  Administered 2015-06-08: 1 mg via INTRAVENOUS
  Filled 2015-06-08: qty 1

## 2015-06-08 MED ORDER — SENNOSIDES-DOCUSATE SODIUM 8.6-50 MG PO TABS
2.0000 | ORAL_TABLET | ORAL | Status: DC
  Administered 2015-06-09: 2 via ORAL
  Filled 2015-06-08: qty 2

## 2015-06-08 MED ORDER — ONDANSETRON HCL 4 MG/2ML IJ SOLN: 4.0000 mg | INTRAMUSCULAR | Status: DC | PRN

## 2015-06-08 MED ORDER — FLEET ENEMA 7-19 GM/118ML RE ENEM: 1.0000 | ENEMA | Freq: Every day | RECTAL | Status: DC | PRN

## 2015-06-08 MED ORDER — WITCH HAZEL-GLYCERIN EX PADS: 1.0000 "application " | MEDICATED_PAD | CUTANEOUS | Status: DC | PRN

## 2015-06-08 MED ORDER — PROMETHAZINE HCL 25 MG/ML IJ SOLN
12.5000 mg | Freq: Once | INTRAMUSCULAR | Status: AC
  Administered 2015-06-08: 12.5 mg via INTRAVENOUS
  Filled 2015-06-08: qty 1

## 2015-06-08 MED ORDER — OXYTOCIN 40 UNITS IN LACTATED RINGERS INFUSION - SIMPLE MED
62.5000 mL/h | INTRAVENOUS | Status: DC
  Administered 2015-06-08: 62.5 mL/h via INTRAVENOUS

## 2015-06-08 MED ORDER — OXYTOCIN 40 UNITS IN LACTATED RINGERS INFUSION - SIMPLE MED
1.0000 m[IU]/min | INTRAVENOUS | Status: DC
  Administered 2015-06-08: 1 m[IU]/min via INTRAVENOUS
  Filled 2015-06-08: qty 1000

## 2015-06-08 MED ORDER — DIPHENHYDRAMINE HCL 25 MG PO CAPS: 25.0000 mg | ORAL_CAPSULE | Freq: Four times a day (QID) | ORAL | Status: DC | PRN

## 2015-06-08 MED ORDER — TETANUS-DIPHTH-ACELL PERTUSSIS 5-2.5-18.5 LF-MCG/0.5 IM SUSP: 0.5000 mL | Freq: Once | INTRAMUSCULAR | Status: DC

## 2015-06-08 MED ORDER — DIBUCAINE 1 % RE OINT: 1.0000 "application " | TOPICAL_OINTMENT | RECTAL | Status: DC | PRN

## 2015-06-08 MED ORDER — IBUPROFEN 600 MG PO TABS
600.0000 mg | ORAL_TABLET | Freq: Four times a day (QID) | ORAL | Status: DC
  Administered 2015-06-09 – 2015-06-10 (×5): 600 mg via ORAL
  Filled 2015-06-08 (×6): qty 1

## 2015-06-08 MED ORDER — LANOLIN HYDROUS EX OINT: TOPICAL_OINTMENT | CUTANEOUS | Status: DC | PRN

## 2015-06-08 NOTE — Progress Notes (Signed)
  Subjective: Wants evaluation for cervical status--wants pitocin turned off if AROM performed.  Understands may need to turn back on if UCs diminish.  Objective: BP 124/87 mmHg  Pulse 101  Temp(Src) 98.4 F (36.9 C) (Oral)  Resp 18  Ht 5\' 8"  (1.727 m)  Wt 75.297 kg (166 lb)  BMI 25.25 kg/m2  LMP 09/08/2014       FHT: Category 1 UC:   regular, every 2-3 minutes SVE:   Dilation: 4 Effacement (%): 100 Station: 0 Exam by:: Manfred ArchV. Billijo Dilling CNM  AROM--small amount bloody fluid, no MSF Pitocin at 8 mu/min--d/c'd per patient request  Assessment:  Induction for SGA/low fluid GBS positive  Plan: Will d/c pitocin at present and observe progress--patient understands pitocin may be required if contractions diminish or progress plateaus.  Nigel BridgemanLATHAM, Eathon Valade CNM 06/08/2015, 3:19 PM

## 2015-06-08 NOTE — Progress Notes (Signed)
  Subjective: Completed 1st dose of ATB for GBS, ready for exam and possible AROM for labor induction.  Objective: BP 101/57 mmHg  Pulse 86  Temp(Src) 98.4 F (36.9 C) (Oral)  Resp 18  Ht 5\' 8"  (1.727 m)  Wt 75.297 kg (166 lb)  BMI 25.25 kg/m2  LMP 09/08/2014     FHT: Category 1 UC:   Occasional SVE:   Dilation: 2 Effacement (%): 80 Station: -1, 0 Exam by:: Veronica Knight CNM  Cervix very posterior, patient very uncomfortable with exam.  Assessment:  Induction for SGA Posterior cervix  Plan: Reviewed need for contractions to move cervix forward, then may be able to AROM, with pitocin as recommendation. Patient reluctantly agreeable with plan--reviewed again the rationale for induction and measures available to facilitate labor progress.  Veronica BridgemanLATHAM, Veronica Knight CNM 06/08/2015, 1100
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
## 2015-06-08 NOTE — Progress Notes (Signed)
  Subjective: Aware of more pain with UCs--denies need for pain med.  Mother and FOB in room.  Objective: BP 98/64 mmHg  Pulse 88  Temp(Src) 98.4 F (36.9 C) (Oral)  Resp 18  Ht 5\' 8"  (1.727 m)  Wt 75.297 kg (166 lb)  BMI 25.25 kg/m2  LMP 09/08/2014      FHT: Category 1 UC:   irregular, every 4-7 minutes SVE:   Dilation: 2 Effacement (%): 80 Station: -1, 0 Exam by:: Manfred ArchV. Faviola Klare CNM at 11am Pitocin at 6 mu/min  Assessment:  Induction for SGA, low fluid GBS positive Latent labor  Plan: Continue current plan. Recheck cervix when UCs are regular and strong.  Nigel BridgemanLATHAM, Allen Basista CNM 06/08/2015, 2:34 PM

## 2015-06-08 NOTE — Progress Notes (Signed)
  Subjective: Crying out with UCs, requesting more pain med.  Received 100 mcg Fentanyl at 1616, with some limited benefit.  Objective: BP 109/56 mmHg  Pulse 80  Temp(Src) 97.9 F (36.6 C) (Oral)  Resp 20  Ht 5\' 8"  (1.727 m)  Wt 75.297 kg (166 lb)  BMI 25.25 kg/m2  LMP 09/08/2014      FHT: Category 1 UC:   regular, every 2-4 minutes SVE:   Dilation: 6 Effacement (%): 100 Station: 0 Exam by:: Manfred ArchV. Xai Frerking CNM  Pitocin remains off per patient request.  Assessment:  Active labor GBS positive SGA fetus  Plan: Continue current care Stadol and Phenergan now. If no progress in next 2 hours, will strongly recommend restarting pitocin.  Nigel BridgemanLATHAM, Shalom Mcguiness CNM 06/08/2015, 5:07 PM

## 2015-06-08 NOTE — Progress Notes (Signed)
  Subjective: Thrashing about with contractions--received IV med at 1722, some benefit.  Objective: BP 106/53 mmHg  Pulse 74  Temp(Src) 97.9 F (36.6 C) (Oral)  Resp 18  Ht 5\' 8"  (1.727 m)  Wt 75.297 kg (166 lb)  BMI 25.25 kg/m2  LMP 09/08/2014      FHT: Category 1 UC:   regular, every 3 minutes SVE:   Dilation: 8 Effacement (%): 100 Station: 0 Exam by:: Manfred ArchV. Jillien Yakel, CNM Bloody show  Assessment:  Active labor SGA  Plan: Continue current observation.  Nigel BridgemanLATHAM, Kimble Hitchens CNM 06/08/2015, 6:03 PM

## 2015-06-09 NOTE — Lactation Note (Signed)
This note was copied from the chart of Veronica Knight. Lactation Consultation Note  Mother requested assistance with breastfeeding. Upon entering mother in bed on her side with infant swaddled beside her. Mother states baby briefly latched and fell asleep. Offered to help her hand express and give baby colostrum on spoon. Mother stated she did not want help at this time.  She stated her baby latched briefly and fell asleep. Mother stated she would like to sleep.  Offered to put baby in crib for safe sleep and mother stated "no". Contacted RN to help with safe sleep. Suggest mother call if she would like assistance later after resting with breastfeeding.      Patient Name: Veronica DeisBoy Veronica Knight WUJWJ'XToday's Date: 06/09/2015     Maternal Data    Feeding    LATCH Score/Interventions                      Lactation Tools Discussed/Used     Consult Status      Veronica Knight, Veronica Knight 06/09/2015, 8:16 AM

## 2015-06-09 NOTE — Lactation Note (Signed)
This note was copied from the chart of Veronica Knight. Lactation Consultation Note  Patient Name: Veronica [name] MWNUU'VToday's Date: 06/09/2015 Reason for consult: Initial assessment Mom requested help with latch to the R breast. She states that her nipple are different sizes and she can not get baby latched to the smaller side. She did allow hands on help, baby was able to latch. Baby came off and when mom tried to re latch baby using a different hold baby was not able to maintain suction. Suggested several times that mom change her hand position to mimic LC. Mom finally stated that she was too frustrated and she wanted a bottle. Offered a #20 nipple and mom agreed to try it. Demonstrated how to put the shield on and baby latched with ease. Mom was moving the breast tissue around while baby was feeding so that she could check breathing. Showed her how to be sure baby was breathing with moving the breast. Encouraged to keep baby close to her for the entire feeding. Mom refused sts feeding with baby, she stated that he needs to wear his gown. She refuse the Harmony to post pump when using the shield. She stated that she does not plan to pump until she gets home and can use her own pump. Suggested that she have someone bring her pump to her and she stated that she going home in the morning. She refused the breast shells. She stated that she thinks they would cause her nipples to be sore even after explaining how they work. She is aware of O/P lactation services and support group. If she changes her mind about the pump or has questions she will call for bf help.   Maternal Data Formula Feeding for Exclusion: No Has patient been taught Hand Expression?: Yes Does the patient have breastfeeding experience prior to this delivery?: No  Feeding Feeding Type: Breast Fed Length of feed: 10 min (still going )  LATCH Score/Interventions Latch: Repeated attempts needed to sustain latch, nipple held in mouth  throughout feeding, stimulation needed to elicit sucking reflex. Intervention(s): Adjust position;Assist with latch  Audible Swallowing: A few with stimulation Intervention(s): Hand expression Intervention(s): Alternate breast massage  Type of Nipple: Everted at rest and after stimulation Intervention(s): Hand pump (refused Harmony )  Comfort (Breast/Nipple): Filling, red/small blisters or bruises, mild/mod discomfort  Problem noted: Mild/Moderate discomfort Interventions (Mild/moderate discomfort): Hand expression  Hold (Positioning): Assistance needed to correctly position infant at breast and maintain latch. Intervention(s): Position options;Support Pillows  LATCH Score: 6  Lactation Tools Discussed/Used WIC Program: Yes   Consult Status Consult Status: Follow-up Date: 06/10/15 Follow-up type: In-patient    Rulon Eisenmengerlizabeth E Classie Weng 06/09/2015, 5:25 PM
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
## 2015-06-09 NOTE — Progress Notes (Signed)
UR chart review completed.  

## 2015-06-09 NOTE — Progress Notes (Signed)
Pt refused lab draw for the morning.

## 2015-06-09 NOTE — Plan of Care (Signed)
Problem: Phase II Progression Outcomes Goal: Pain controlled on oral analgesia Outcome: Completed/Met Date Met:  06/09/15 Patient refusing pain meds

## 2015-06-09 NOTE — Progress Notes (Signed)
Subjective: Postpartum Day 1: Vaginal delivery, 1st degree vaginal and right labial laceration Patient up ad lib, reports no syncope or dizziness. Feeding:  Breast Contraceptive plan: Considering Micronor  In good spirits today, mother at bedside.  Patient declined blood work today--"they stuck me, but couldn't get any blood out".  Also declined Ibuprophen--reports no pain.  Wants to return to work by 11/28, which would be exactly six weeks.   I advised her to make her pp appt for 5 weeks at [facility].  Objective: Vital signs in last 24 hours: Temp:  [97.9 F (36.6 C)-99.1 F (37.3 C)] 99.1 F (37.3 C) (10/17 0530) Pulse Rate:  [71-110] 85 (10/17 0530) Resp:  [16-20] 18 (10/17 0530) BP: (90-124)/(38-87) 101/64 mmHg (10/17 0530)  Physical Exam:  General: alert Lochia: appropriate Uterine Fundus: firm Perineum: healing well DVT Evaluation: No evidence of DVT seen on physical exam. Negative Homan's sign.   CBC Latest Ref Rng 06/08/2015 10/23/2014 10/18/2014  WBC 4.0 - 10.5 K/uL 6.5 5.6 6.9  Hemoglobin 12.0 - 15.0 g/dL 16.114.2 09.613.7 04.514.4  Hematocrit 36.0 - 46.0 % 40.5 39.1 40.1  Platelets 150 - 400 K/uL 201 252 276     Assessment/Plan: Status post vaginal delivery day 1 Stable Continue current care. Plan for discharge tomorrow  Plan pp f/u visit at 5 weeks due to plan for RTW at 6 weeks. Will defer pp CBC due to pre-delivery Hgb of 14.2, and minimal blood loss at delivery.   Nyra CapesLATHAM, VICKICNM 06/09/2015, 9:32 AM

## 2015-06-10 MED ORDER — IBUPROFEN 600 MG PO TABS: 600.0000 mg | ORAL_TABLET | Freq: Four times a day (QID) | ORAL | Status: DC | PRN

## 2015-06-10 MED ORDER — NORETHINDRONE 0.35 MG PO TABS: 1.0000 | ORAL_TABLET | Freq: Every day | ORAL | Status: DC

## 2015-06-10 NOTE — Discharge Summary (Signed)
OB Discharge Summary  Patient Name: Veronica Knight DOB: 1990/11/14 MRN: [id number]  Date of admission: 06/08/2015 Delivering MD: Nigel BridgemanLATHAM, VICKI   Date of discharge: 06/10/2015  Admitting diagnosis: Induction Intrauterine pregnancy: 7658w0d     Secondary diagnosis: 1) SGA, 2) Low AFI -- 8.68 cm, 8th%tile on 06/04/15, 3) Placenta succenturiate lobe affecting fetus, 4) Velamentous cord insertion, and 5) Positive GBS test     Discharge diagnosis: Term Pregnancy Delivered                                                                                                Post partum procedures:None  Augmentation: Pitocin  Complications: None  Hospital course:  Induction of Labor With Vaginal Delivery   24 y.o. yo G2P1011 at 4858w0d was admitted to the hospital 06/08/2015 for induction of labor.  Indication for induction: Favorable cervix at term and 1) SGA, 2) Low AFI -- 8.68 cm, 8th%tile on 06/04/15, 3) Placenta succenturiate lobe and 4) Velamentous cord insertion.  Patient had an uncomplicated labor course as follows: Membrane Rupture Time/Date: 3:15 PM ,06/08/2015   Intrapartum Procedures: Episiotomy: None [1]                                         Lacerations:  1st degree [2];Labial [10]  Patient had delivery of a Viable infant.  Information for the patient's newborn:  Knight, Tyler DeisBoy Niccole [[id number]][[id number]]  Delivery Method: Vaginal, Spontaneous Delivery (Filed from Delivery Summary)   Patient had an uncomplicated postpartum course. She is ambulating, tolerating a regular diet, passing flatus, and urinating well. Patient is discharged home in stable condition on 06/10/15.   Physical exam  Filed Vitals:   06/08/15 2220 06/09/15 0025 06/09/15 0530 06/09/15 1915  BP: 102/52 106/56 101/64 102/75  Pulse: 102 72 85 72  Temp: 98.5 F (36.9 C) 98.1 F (36.7 C) 99.1 F (37.3 C) 97.8 F (36.6 C)  TempSrc: Oral Oral  Oral  Resp: 16 20 18 18   Height:      Weight:       General:  alert Lochia: appropriate Uterine Fundus: firm Incision: Healing well DVT Evaluation: No evidence of DVT seen on physical exam. Negative Homan's sign. Labs: Lab Results  Component Value Date   WBC 6.5 06/08/2015   HGB 14.2 06/08/2015   HCT 40.5 06/08/2015   MCV 90.4 06/08/2015   PLT 201 06/08/2015   CMP Latest Ref Rng 10/09/2014  Glucose 70 - 99 mg/dL 77  BUN 6 - 23 mg/dL 9  Creatinine 1.300.50 - 8.651.10 mg/dL 7.840.62  Sodium 696135 - 295145 mmol/L 135  Potassium 3.5 - 5.1 mmol/L 4.1  Chloride 96 - 112 mmol/L 108  CO2 19 - 32 mmol/L 22  Calcium 8.4 - 10.5 mg/dL 9.1  Total Protein 6.0 - 8.3 g/dL 6.3  Total Bilirubin 0.3 - 1.2 mg/dL 0.3  Alkaline Phos 39 - 117 U/L 37(L)  AST 0 - 37 U/L 14  ALT 0 - 35 U/L 12  Discharge instruction: per After Visit Summary and "Baby and Me Booklet".  Medications:  Current facility-administered medications:  .  acetaminophen (TYLENOL) tablet 650 mg, 650 mg, Oral, Q4H PRN, Nigel Bridgeman, CNM .  benzocaine-Menthol (DERMOPLAST) 20-0.5 % topical spray 1 application, 1 application, Topical, PRN, Nigel Bridgeman, CNM, 1 application at 06/09/15 0010 .  witch hazel-glycerin (TUCKS) pad 1 application, 1 application, Topical, PRN **AND** dibucaine (NUPERCAINAL) 1 % rectal ointment 1 application, 1 application, Rectal, PRN, Nigel Bridgeman, CNM .  diphenhydrAMINE (BENADRYL) capsule 25 mg, 25 mg, Oral, Q6H PRN, Nigel Bridgeman, CNM .  ibuprofen (ADVIL,MOTRIN) tablet 600 mg, 600 mg, Oral, 4 times per day, Nigel Bridgeman, CNM, 600 mg at 06/09/15 2332 .  lanolin ointment, , Topical, PRN, Nigel Bridgeman, CNM .  lidocaine (PF) (XYLOCAINE) 1 % injection 30 mL, 30 mL, Subcutaneous, PRN, Nigel Bridgeman, CNM, 30 mL at 06/08/15 1902 .  ondansetron (ZOFRAN) tablet 4 mg, 4 mg, Oral, Q4H PRN **OR** ondansetron (ZOFRAN) injection 4 mg, 4 mg, Intravenous, Q4H PRN, Nigel Bridgeman, CNM .  oxyCODONE-acetaminophen (PERCOCET/ROXICET) 5-325 MG per tablet 1 tablet, 1 tablet, Oral, Q4H PRN, Nigel Bridgeman, CNM .   oxyCODONE-acetaminophen (PERCOCET/ROXICET) 5-325 MG per tablet 2 tablet, 2 tablet, Oral, Q4H PRN, Nigel Bridgeman, CNM .  prenatal multivitamin tablet 1 tablet, 1 tablet, Oral, Q1200, Nigel Bridgeman, CNM .  senna-docusate (Senokot-S) tablet 2 tablet, 2 tablet, Oral, Q24H, Nigel Bridgeman, CNM, 2 tablet at 06/09/15 2332 .  simethicone (MYLICON) chewable tablet 80 mg, 80 mg, Oral, PRN, Nigel Bridgeman, CNM .  Tdap (BOOSTRIX) injection 0.5 mL, 0.5 mL, Intramuscular, Once, Nigel Bridgeman, CNM, 0.5 mL at 06/09/15 0923 .  zolpidem (AMBIEN) tablet 5 mg, 5 mg, Oral, QHS PRN, Nigel Bridgeman, CNM  Diet: routine diet  Activity: Advance as tolerated. Pelvic rest for 6 weeks.   Outpatient follow up:5 weeks  Postpartum contraception: Progesterone only pills  Newborn Data: Live born female, no name yet Birth Weight: 6 lb 0.1 oz (2725 g) APGARS: 9, 9  Baby Feeding: Breast Disposition:home with mother   06/10/2015, 6:21 AM Sherre Scarlet, CNM

## 2015-06-10 NOTE — Lactation Note (Signed)
This note was copied from the chart of Veronica Knight. Lactation Consultation Note  Mother states her left nipple has scab. Provided her comfort gels. She is using NS on right nipple to latch. Suggest she start post pumping to maximize her milk supply. Suggest pumping 4-6 times day for 10-15 min. Provided her with an extra #20NS.  Reviewed engorgement care and monitoring voids/stools. Reminded her to feed baby every 2-3 hours.  She is supplementing w/ formula.  Provided her with volume guidelines.    Patient Name: [name] ZOXWR'UToday's Date: 06/10/2015 Reason for consult: Follow-up assessment   Maternal Data    Feeding Feeding Type: Formula  LATCH Score/Interventions                      Lactation Tools Discussed/Used     Consult Status Consult Status: Complete    Hardie PulleyBerkelhammer, Liara Holm Boschen 06/10/2015, 10:54 AM

## 2015-06-10 NOTE — Discharge Instructions (Signed)
Contraception Choices Contraception (birth control) is the use of any methods or devices to prevent pregnancy. Below are some methods to help avoid pregnancy. HORMONAL METHODS   Contraceptive implant. This is a thin, plastic tube containing progesterone hormone. It does not contain estrogen hormone. Your health care provider inserts the tube in the inner part of the upper arm. The tube can remain in place for up to 3 years. After 3 years, the implant must be removed. The implant prevents the ovaries from releasing an egg (ovulation), thickens the cervical mucus to prevent sperm from entering the uterus, and thins the lining of the inside of the uterus.  Progesterone-only injections. These injections are given every 3 months by your health care provider to prevent pregnancy. This synthetic progesterone hormone stops the ovaries from releasing eggs. It also thickens cervical mucus and changes the uterine lining. This makes it harder for sperm to survive in the uterus.  Birth control pills. These pills contain estrogen and progesterone hormone. They work by preventing the ovaries from releasing eggs (ovulation). They also cause the cervical mucus to thicken, preventing the sperm from entering the uterus. Birth control pills are prescribed by a health care provider.Birth control pills can also be used to treat heavy periods.  Minipill. This type of birth control pill contains only the progesterone hormone. They are taken every day of each month and must be prescribed by your health care provider.  Birth control patch. The patch contains hormones similar to those in birth control pills. It must be changed once a week and is prescribed by a health care provider.  Vaginal ring. The ring contains hormones similar to those in birth control pills. It is left in the vagina for 3 weeks, removed for 1 week, and then a new one is put back in place. The patient must be comfortable inserting and removing the ring  from the vagina.A health care provider's prescription is necessary.  Emergency contraception. Emergency contraceptives prevent pregnancy after unprotected sexual intercourse. This pill can be taken right after sex or up to 5 days after unprotected sex. It is most effective the sooner you take the pills after having sexual intercourse. Most emergency contraceptive pills are available without a prescription. Check with your pharmacist. Do not use emergency contraception as your only form of birth control. BARRIER METHODS   Female condom. This is a thin sheath (latex or rubber) that is worn over the penis during sexual intercourse. It can be used with spermicide to increase effectiveness.  Female condom. This is a soft, loose-fitting sheath that is put into the vagina before sexual intercourse.  Diaphragm. This is a soft, latex, dome-shaped barrier that must be fitted by a health care provider. It is inserted into the vagina, along with a spermicidal jelly. It is inserted before intercourse. The diaphragm should be left in the vagina for 6 to 8 hours after intercourse.  Cervical cap. This is a round, soft, latex or plastic cup that fits over the cervix and must be fitted by a health care provider. The cap can be left in place for up to 48 hours after intercourse.  Sponge. This is a soft, circular piece of polyurethane foam. The sponge has spermicide in it. It is inserted into the vagina after wetting it and before sexual intercourse.  Spermicides. These are chemicals that kill or block sperm from entering the cervix and uterus. They come in the form of creams, jellies, suppositories, foam, or tablets. They do not require a  prescription. They are inserted into the vagina with an applicator before having sexual intercourse. The process must be repeated every time you have sexual intercourse. INTRAUTERINE CONTRACEPTION  Intrauterine device (IUD). This is a T-shaped device that is put in a woman's uterus  during a menstrual period to prevent pregnancy. There are 2 types:  Copper IUD. This type of IUD is wrapped in copper wire and is placed inside the uterus. Copper makes the uterus and fallopian tubes produce a fluid that kills sperm. It can stay in place for 10 years.  Hormone IUD. This type of IUD contains the hormone progestin (synthetic progesterone). The hormone thickens the cervical mucus and prevents sperm from entering the uterus, and it also thins the uterine lining to prevent implantation of a fertilized egg. The hormone can weaken or kill the sperm that get into the uterus. It can stay in place for 3-5 years, depending on which type of IUD is used. PERMANENT METHODS OF CONTRACEPTION  Female tubal ligation. This is when the woman's fallopian tubes are surgically sealed, tied, or blocked to prevent the egg from traveling to the uterus.  Hysteroscopic sterilization. This involves placing a small coil or insert into each fallopian tube. Your doctor uses a technique called hysteroscopy to do the procedure. The device causes scar tissue to form. This results in permanent blockage of the fallopian tubes, so the sperm cannot fertilize the egg. It takes about 3 months after the procedure for the tubes to become blocked. You must use another form of birth control for these 3 months.  Female sterilization. This is when the female has the tubes that carry sperm tied off (vasectomy).This blocks sperm from entering the vagina during sexual intercourse. After the procedure, the man can still ejaculate fluid (semen). NATURAL PLANNING METHODS  Natural family planning. This is not having sexual intercourse or using a barrier method (condom, diaphragm, cervical cap) on days the woman could become pregnant.  Calendar method. This is keeping track of the length of each menstrual cycle and identifying when you are fertile.  Ovulation method. This is avoiding sexual intercourse during ovulation.  Symptothermal  method. This is avoiding sexual intercourse during ovulation, using a thermometer and ovulation symptoms.  Post-ovulation method. This is timing sexual intercourse after you have ovulated. Regardless of which type or method of contraception you choose, it is important that you use condoms to protect against the transmission of sexually transmitted infections (STIs). Talk with your health care provider about which form of contraception is most appropriate for you.   This information is not intended to replace advice given to you by your health care provider. Make sure you discuss any questions you have with your health care provider.   Document Released: 08/09/2005 Document Revised: 08/14/2013 Document Reviewed: 02/01/2013 Elsevier Interactive Patient Education 2016 [employer]. Postpartum Depression and Baby Blues The postpartum period begins right after the birth of a baby. During this time, there is often a great amount of joy and excitement. It is also a time of many changes in the life of the parents. Regardless of how many times a mother gives birth, each child brings new challenges and dynamics to the family. It is not unusual to have feelings of excitement along with confusing shifts in moods, emotions, and thoughts. All mothers are at risk of developing postpartum depression or the "baby blues." These mood changes can occur right after giving birth, or they may occur many months after giving birth. The baby blues or postpartum  depression can be mild or severe. Additionally, postpartum depression can go away rather quickly, or it can be a long-term condition.  CAUSES Raised hormone levels and the rapid drop in those levels are thought to be a main cause of postpartum depression and the baby blues. A number of hormones change during and after pregnancy. Estrogen and progesterone usually decrease right after the delivery of your baby. The levels of thyroid hormone and various cortisol steroids  also rapidly drop. Other factors that play a role in these mood changes include major life events and genetics.  RISK FACTORS If you have any of the following risks for the baby blues or postpartum depression, know what symptoms to watch out for during the postpartum period. Risk factors that may increase the likelihood of getting the baby blues or postpartum depression include:  Having a personal or family history of depression.   Having depression while being pregnant.   Having premenstrual mood issues or mood issues related to oral contraceptives.  Having a lot of life stress.   Having marital conflict.   Lacking a social support network.   Having a baby with special needs.   Having health problems, such as diabetes.  SIGNS AND SYMPTOMS Symptoms of baby blues include:  Brief changes in mood, such as going from extreme happiness to sadness.  Decreased concentration.   Difficulty sleeping.   Crying spells, tearfulness.   Irritability.   Anxiety.  Symptoms of postpartum depression typically begin within the first month after giving birth. These symptoms include:  Difficulty sleeping or excessive sleepiness.   Marked weight loss.   Agitation.   Feelings of worthlessness.   Lack of interest in activity or food.  Postpartum psychosis is a very serious condition and can be dangerous. Fortunately, it is rare. Displaying any of the following symptoms is cause for immediate medical attention. Symptoms of postpartum psychosis include:   Hallucinations and delusions.   Bizarre or disorganized behavior.   Confusion or disorientation.  DIAGNOSIS  A diagnosis is made by an evaluation of your symptoms. There are no medical or lab tests that lead to a diagnosis, but there are various questionnaires that a health care provider may use to identify those with the baby blues, postpartum depression, or psychosis. Often, a screening tool called the [place]  Postnatal Depression Scale is used to diagnose depression in the postpartum period.  TREATMENT The baby blues usually goes away on its own in 1-2 weeks. Social support is often all that is needed. You will be encouraged to get adequate sleep and rest. Occasionally, you may be given medicines to help you sleep.  Postpartum depression requires treatment because it can last several months or longer if it is not treated. Treatment may include individual or group therapy, medicine, or both to address any social, physiological, and psychological factors that may play a role in the depression. Regular exercise, a healthy diet, rest, and social support may also be strongly recommended.  Postpartum psychosis is more serious and needs treatment right away. Hospitalization is often needed. HOME CARE INSTRUCTIONS  Get as much rest as you can. Nap when the baby sleeps.   Exercise regularly. Some women find yoga and walking to be beneficial.   Eat a balanced and nourishing diet.   Do little things that you enjoy. Have a cup of tea, take a bubble bath, read your favorite magazine, or listen to your favorite music.  Avoid alcohol.   Ask for help with household chores, cooking, grocery  shopping, or running errands as needed. Do not try to do everything.   Talk to people close to you about how you are feeling. Get support from your partner, family members, friends, or other new moms.  Try to stay positive in how you think. Think about the things you are grateful for.   Do not spend a lot of time alone.   Only take over-the-counter or prescription medicine as directed by your health care provider.  Keep all your postpartum appointments.   Let your health care provider know if you have any concerns.  SEEK MEDICAL CARE IF: You are having a reaction to or problems with your medicine. SEEK IMMEDIATE MEDICAL CARE IF:  You have suicidal feelings.   You think you may harm the baby or someone  else. MAKE SURE YOU:  Understand these instructions.  Will watch your condition.  Will get help right away if you are not doing well or get worse.   This information is not intended to replace advice given to you by your health care provider. Make sure you discuss any questions you have with your health care provider.   Document Released: 05/13/2004 Document Revised: 08/14/2013 Document Reviewed: 05/21/2013 Elsevier Interactive Patient Education 2016 [employer]. Anemia, Nonspecific Anemia is a condition in which the concentration of red blood cells or hemoglobin in the blood is below normal. Hemoglobin is a substance in red blood cells that carries oxygen to the tissues of the body. Anemia results in not enough oxygen reaching these tissues.  CAUSES  Common causes of anemia include:   Excessive bleeding. Bleeding may be internal or external. This includes excessive bleeding from periods (in women) or from the intestine.   Poor nutrition.   Chronic kidney, thyroid, and liver disease.  Bone marrow disorders that decrease red blood cell production.  Cancer and treatments for cancer.  HIV, AIDS, and their treatments.  Spleen problems that increase red blood cell destruction.  Blood disorders.  Excess destruction of red blood cells due to infection, medicines, and autoimmune disorders. SIGNS AND SYMPTOMS   Minor weakness.   Dizziness.   Headache.  Palpitations.   Shortness of breath, especially with exercise.   Paleness.  Cold sensitivity.  Indigestion.  Nausea.  Difficulty sleeping.  Difficulty concentrating. Symptoms may occur suddenly or they may develop slowly.  DIAGNOSIS  Additional blood tests are often needed. These help your health care provider determine the best treatment. Your health care provider will check your stool for blood and look for other causes of blood loss.  TREATMENT  Treatment varies depending on the cause of the anemia.  Treatment can include:   Supplements of iron, vitamin F62, or folic acid.   Hormone medicines.   A blood transfusion. This may be needed if blood loss is severe.   Hospitalization. This may be needed if there is significant continual blood loss.   Dietary changes.  Spleen removal. HOME CARE INSTRUCTIONS Keep all follow-up appointments. It often takes many weeks to correct anemia, and having your health care provider check on your condition and your response to treatment is very important. SEEK IMMEDIATE MEDICAL CARE IF:   You develop extreme weakness, shortness of breath, or chest pain.   You become dizzy or have trouble concentrating.  You develop heavy vaginal bleeding.   You develop a rash.   You have bloody or black, tarry stools.   You faint.   You vomit up blood.   You vomit repeatedly.   You have  abdominal pain.  You have a fever or persistent symptoms for more than 2-3 days.   You have a fever and your symptoms suddenly get worse.   You are dehydrated.  MAKE SURE YOU:  Understand these instructions.  Will watch your condition.  Will get help right away if you are not doing well or get worse.   This information is not intended to replace advice given to you by your health care provider. Make sure you discuss any questions you have with your health care provider.   Document Released: 09/16/2004 Document Revised: 04/11/2013 Document Reviewed: 02/02/2013 Elsevier Interactive Patient Education 2016 [employer] Breast Engorgement Breast engorgement is the overfilling of your breasts with breast milk. In the first few weeks after giving birth, you may experience breast engorgement. Although it is normal for your breasts to feel heavy, full, and uncomfortable within 3-5 days of giving birth, breast engorgement can make your breasts throb and feel hard, tightly stretched, warm, and tender. Engorgement peaks about the fifth day after you give birth.  Breast engorgement can be easily treated and does not require you to stop breastfeeding.  CAUSES OF BREAST ENGORGEMENT Some women delay feedings because of sore or cracked nipples, which can lead to engorgement. Cracked and sore nipples often are caused by inadequate latching (when your baby's mouth attaches to your breast to breastfeed). If your baby is latched on properly, he or she should be able to breastfeed as long as needed, without causing any pain. If you do feel pain while breastfeeding, take your baby off your breast and try again. Get help from your health care provider or a lactation consultant if you continue to have pain. Other causes of engorgement include:   Improper position of your baby while breastfeeding.  Allowing too much time to pass between feedings.  Reduction in breastfeeding because you give your baby water, juice, formula, breast milk from a bottle, or a pacifier instead of breastfeeding.  Changes in your baby's feeding patterns.  Weak sucking from your baby, which causes less milk to be taken out of your breast during feedings.  Fatigue, stress, anemia.  Plugged milk ducts.  A history of breast surgery. SIGNS AND SYMPTOMS OF BREAST ENGORGEMENT If your breasts become engorged, you may experience:   Breast swelling, tenderness, warmth, redness, or throbbing.  Breast hardness and stretching of the skin around your breast.  Flattening, tightening, and hardening of your nipple.  A low-grade fever, which can be confused with a breast infection. RECOMMENDATIONS TO EASE BREAST ENGORGEMENT Breast engorgement should improve in 24-48 hours after following these recommendations:  Breastfeed when you feel the need to reduce the fullness of your breasts or when your baby shows signs of hunger. This is called "breastfeeding on demand."  Newborns (babies younger than 4 weeks) often breastfeed every 1-3 hours during the day. You may need to awaken your baby to feed if  he or she is asleep at a feeding time.  Do not allow your baby to sleep longer than 5 hours during the night without a feeding.  Pump or hand-express breast milk before breastfeeding to soften your breast, areola, and nipple.   Apply warm, moist heat (in the shower or with warm water-soaked hand towels) just before feeding or pumping, or massage your breast before or during breastfeeding. This increases circulation and helps your milk to flow.  Completely empty your breasts when breastfeeding or pumping. Afterward, wear a snug bra (nursing or regular) or tank top for  1-2 days to signal your body to slightly decrease milk production. Only wear snug bras or tank tops to treat engorgement. Tight bras typically should be avoided by breastfeeding mothers. Once engorgement is relieved, return to wearing regular, loose-fitting clothes.  Apply ice packs to your breasts to lessen the pain from engorgement and relieve swelling, unless the ice is uncomfortable for you.  Do not delay feedings. Try to relax when it is time to feed your baby. This helps to trigger your "let-down reflex," which releases milk from your breast.  Ensure your baby is latched on to your breast and positioned properly while breastfeeding.   Allow your baby to remain at your breast as long as he or she is latched on well and actively sucking. Your baby will let you know when he or she is done breastfeeding by pulling away from your breast or falling asleep.  Avoid introducing bottles or pacifiers to your baby in the early weeks of breastfeeding. Wait to introduce these things until after resolving any breastfeeding challenges.  Try to pump your milk on the same schedule as when your baby would breastfeed if you are returning to work or away from home for an extended period.  Drink plenty of fluids to avoid dehydration, which can eventually put you at greater risk of breast engorgement.  CALL YOUR HEALTH CARE PROVIDER OR  LACTATION CONSULTANT IF:   Engorgement lasts longer than 2 days, even after treatment.  You have flu-like symptoms, such as a fever, chills, or body aches.  Your breasts become increasingly red and painful.   This information is not intended to replace advice given to you by your health care provider. Make sure you discuss any questions you have with your health care provider.   Document Released: 12/04/2004 Document Revised: 08/30/2014 Document Reviewed: 02/01/2013 Elsevier Interactive Patient Education 2016 [employer] Postpartum Care After Vaginal Delivery After you deliver your newborn (postpartum period), the usual stay in the hospital is 24-72 hours. If there were problems with your labor or delivery, or if you have other medical problems, you might be in the hospital longer.  While you are in the hospital, you will receive help and instructions on how to care for yourself and your newborn during the postpartum period.  While you are in the hospital:  Be sure to tell your nurses if you have pain or discomfort, as well as where you feel the pain and what makes the pain worse.  If you had an incision made near your vagina (episiotomy) or if you had some tearing during delivery, the nurses may put ice packs on your episiotomy or tear. The ice packs may help to reduce the pain and swelling.  If you are breastfeeding, you may feel uncomfortable contractions of your uterus for a couple of weeks. This is normal. The contractions help your uterus get back to normal size.  It is normal to have some bleeding after delivery.  For the first 1-3 days after delivery, the flow is red and the amount may be similar to a period.  It is common for the flow to start and stop.  In the first few days, you may pass some small clots. Let your nurses know if you begin to pass large clots or your flow increases.  Do not  flush blood clots down the toilet before having the nurse look at them.  During  the next 3-10 days after delivery, your flow should become more watery and pink or brown-tinged  in color.  Ten to fourteen days after delivery, your flow should be a small amount of yellowish-white discharge.  The amount of your flow will decrease over the first few weeks after delivery. Your flow may stop in 6-8 weeks. Most women have had their flow stop by 12 weeks after delivery.  You should change your sanitary pads frequently.  Wash your hands thoroughly with soap and water for at least 20 seconds after changing pads, using the toilet, or before holding or feeding your newborn.  You should feel like you need to empty your bladder within the first 6-8 hours after delivery.  In case you become weak, lightheaded, or faint, call your nurse before you get out of bed for the first time and before you take a shower for the first time.  Within the first few days after delivery, your breasts may begin to feel tender and full. This is called engorgement. Breast tenderness usually goes away within 48-72 hours after engorgement occurs. You may also notice milk leaking from your breasts. If you are not breastfeeding, do not stimulate your breasts. Breast stimulation can make your breasts produce more milk.  Spending as much time as possible with your newborn is very important. During this time, you and your newborn can feel close and get to know each other. Having your newborn stay in your room (rooming in) will help to strengthen the bond with your newborn. It will give you time to get to know your newborn and become comfortable caring for your newborn.  Your hormones change after delivery. Sometimes the hormone changes can temporarily cause you to feel sad or tearful. These feelings should not last more than a few days. If these feelings last longer than that, you should talk to your caregiver.  If desired, talk to your caregiver about methods of family planning or contraception.  Talk to your  caregiver about immunizations. Your caregiver may want you to have the following immunizations before leaving the hospital:  Tetanus, diphtheria, and pertussis (Tdap) or tetanus and diphtheria (Td) immunization. It is very important that you and your family (including grandparents) or others caring for your newborn are up-to-date with the Tdap or Td immunizations. The Tdap or Td immunization can help protect your newborn from getting ill.  Rubella immunization.  Varicella (chickenpox) immunization.  Influenza immunization. You should receive this annual immunization if you did not receive the immunization during your pregnancy.   This information is not intended to replace advice given to you by your health care provider. Make sure you discuss any questions you have with your health care provider.   Document Released: 06/06/2007 Document Revised: 05/03/2012 Document Reviewed: 04/05/2012 Elsevier Interactive Patient Education [employer]

## 2015-06-15 ENCOUNTER — Inpatient Hospital Stay (HOSPITAL_COMMUNITY)
Admission: AD | Admit: 2015-06-15 | Payer: Medicaid Other | Source: Ambulatory Visit | Admitting: Obstetrics and Gynecology

## 2015-06-27 ENCOUNTER — Ambulatory Visit (HOSPITAL_COMMUNITY)
Admission: RE | Admit: 2015-06-27 | Discharge: 2015-06-27 | Disposition: A | Discharge status: Home / Self Care | Payer: Medicaid Other | Source: Ambulatory Visit | Attending: Obstetrics and Gynecology | Admitting: Obstetrics and Gynecology

## 2015-06-27 NOTE — Lactation Note (Signed)
Lactation Consult  Mother's reason for visit:  Help with latch Visit Type:  Outpatient Appointment Notes:   Veronica Knight now 6419 days old. Mom was breastfeeding in hospital, but started supplementing with bottle due to weight loss then baby would not continue to latch. Mom had difficulty in hospital with baby latching to right breast, was given nipple shield to use but felt this was uncomfortable. Since d/c home Mom has pump/bottle fed. Mom not sure about BF, she is returning to work Monday part-time. Will be working 4-5 hours per day and will not be able to pump. At present she is only pumping 2-3 times/day but reports receiving 6 oz per breast with pumping. Mom denies engorgement between pumping. If baby will latch and sustain the latch, Mom considering continuing to breastfeed and provide breast milk.  Consult:  Initial Lactation Consultant:  Veronica Knight, Veronica Knight  ________________________________________________________________________   Baby's Name: Veronica Inkonnie Cotrail Isley Jr. Date of Birth: 06/08/2016 Pediatrician: Veronica Knight Gender: female Gestational Age: 2061w0d (At Birth) Birth Weight: 6 lb 0.1 oz (2725 g) Weight at Discharge: Weight: 5 lb 10.7 oz (2570 g)[facility]   06/08/15 1845 06/09/15 2308  Weight: 6 lb 0.1 oz (2725 g) 5 lb 10.7 oz (2570 g)   Last weight taken from location [facility]: 06/23/15  7 lb. 1.0 Location: Peds office Weight today: 7 lb. 5.5 oz/3330 gm    ________________________________________________________________________  Mother's Name: Veronica Knight Type of Knight:  SVB Breastfeeding Experience:  P1 Maternal Medical Conditions:  Patent foramen ovale Maternal Medications:  PNV  ________________________________________________________________________  Breastfeeding History (Post Discharge)  Frequency of breastfeeding:  Mom has been pump/bottle  Supplementation  Formula:  Mom  reports giving baby 4 oz of formula every 2 hours      Breastmilk:  Mom giving 4-6 oz of breast milk - 1 bottle am/pm    2 times/day  Method:  Bottle,   Pumping  Type of pump:  Medela pump in style Frequency:  Mom reports when breasts feel full - 2-3 times/day for 25 minutes Volume: 6 oz per breast  Infant Intake and Output Assessment  Voids:  8+ in 24 hrs.  Color:  Clear yellow Stools:  3+ in 24 hrs.  Color:  Yellow  ________________________________________________________________________  Maternal Breast Assessment  Breast:  Soft Nipple:  Erect Pain level:  0   _______________________________________________________________________ Feeding Assessment/Evaluation  Initial feeding assessment:  Infant's oral assessment:  WNL  Positioning:  Football Left breast  LATCH documentation:  Latch:  2 = Grasps breast easily, tongue down, lips flanged, rhythmical sucking.  Audible swallowing:  2 = Spontaneous and intermittent  Type of nipple:  2 = Everted at rest and after stimulation  Comfort (Breast/Nipple):  2 = Soft/non-tender  Hold (Positioning):  1 = Assistance needed to correctly position infant at breast and maintain latch  LATCH score:  9  Attached assessment:  Deep  Lips flanged:  Yes.    Lips untucked:  Yes.    Suck assessment:  Nutritive  Pre-feed weight:  3330 g  (7 lb. 5.5 oz.) Post-feed weight:  3372 g (7 lb. 7.0 oz.) Amount transferred:  42 ml  With nursing from left breast for 17 minutes.   Additional Feeding Assessment -   Infant's oral assessment:  WNL  Positioning:  Football Right breast  LATCH documentation:  Latch:  2 = Grasps breast easily, tongue down, lips flanged, rhythmical sucking.  Audible swallowing:  2 = Spontaneous and intermittent  Type of nipple:  2 = Everted at rest and after stimulation  Comfort (Breast/Nipple):  2 = Soft / non-tender  Hold (Positioning):  1 = Assistance needed to correctly position infant at breast and  maintain latch  LATCH score:  9  Attached assessment:  Deep  Lips flanged:  Yes.    Lips untucked:  Yes.    Suck assessment:  Nutritive  Baby latched to right breast for 5 minutes in football hold and transferred 8 ml. Mom then changed the diaper and baby re-latched for an additional 6 minutes transferring 12 ml. Total transfer from right breast was 20 ml.   Total amount transferred:  62 ml  Mom was pleased that baby latched without difficulty. She will consider breastfeeding with some feedings, probably mostly at night.  LC encouraged Mom to pump more frequently to protect milk supply. Discussed BF or pumping before work, then BF or pumping after work and getting into a schedule of every 3 hours with pumping or BF.   LC not sure of Mom's commitment but Mom reports she will consider her options and decide what would work best for her. Mom reports she was grateful for the help.  Baby was gassy and spit up small amount at this visit. Advised Mom 4-6 oz every 2 hours was a lot for baby to take at a feeding at this age. Discussed average feeding volumes and encouraged Mom to consider smaller volumes especially if baby gassy/spitty.  Call as needed for questions/concerns.

## 2015-10-10 IMAGING — US US OB TRANSVAGINAL
1 series · 13 of 28 positions shown · non-contrast
Comparison: None.

CLINICAL DATA: Spotting.  Evaluate right ovary cysts.

EXAM:
OBSTETRIC <14 WK US AND TRANSVAGINAL OB
US DOPPLER ULTRASOUND OF OVARIES
TECHNIQUE: Transvaginal ultrasound examinations were performed for complete
evaluation of the gestation as well as the maternal uterus, adnexal
regions, and pelvic cul-de-sac. Transvaginal technique was performed
to assess early pregnancy.
Color and duplex Doppler ultrasound was utilized to evaluate blood
flow to the ovaries.

[Series 1: us ob transvaginal · 13 of 40 slices shown]
[im 2/40]
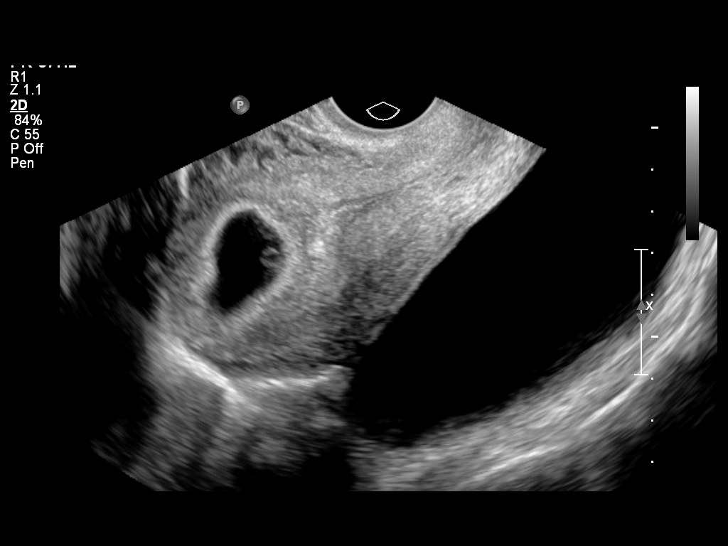
[im 5/40]
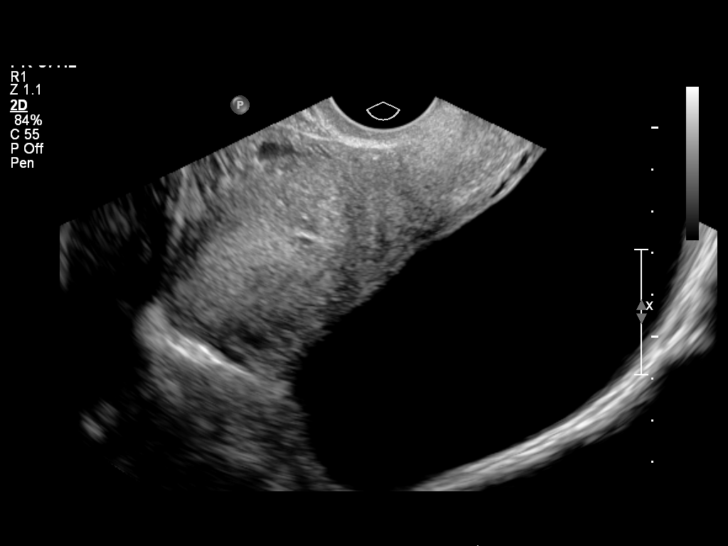
[im 8/40]
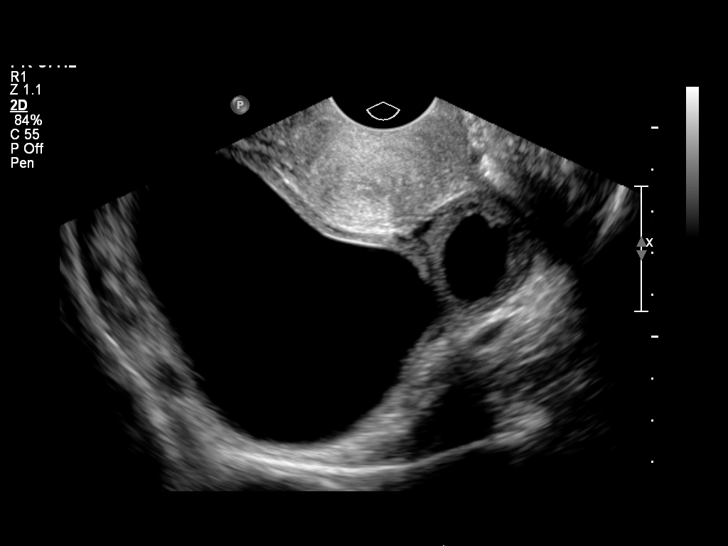
[im 11/40]
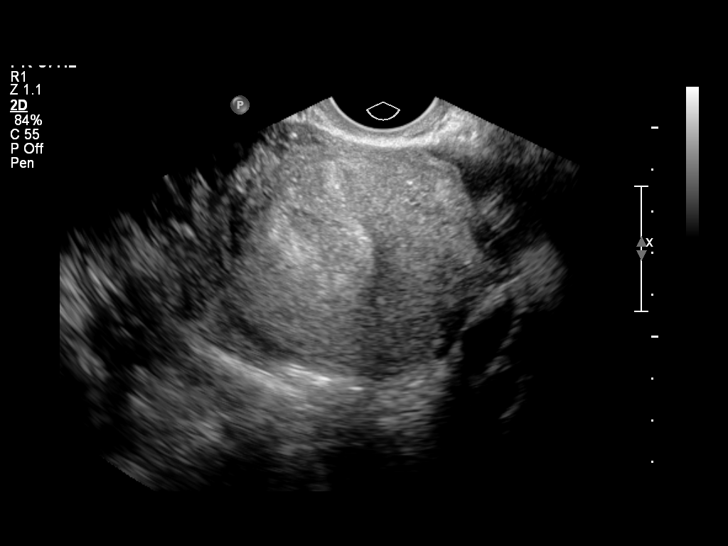
[im 14/40]
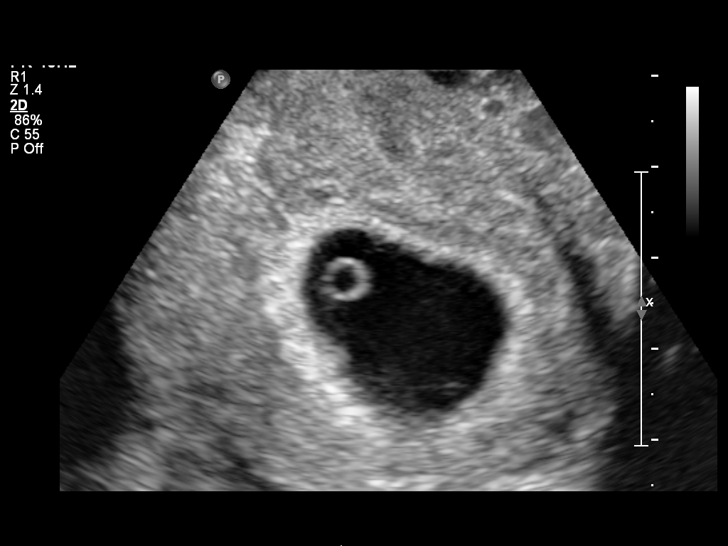
[im 16/40]
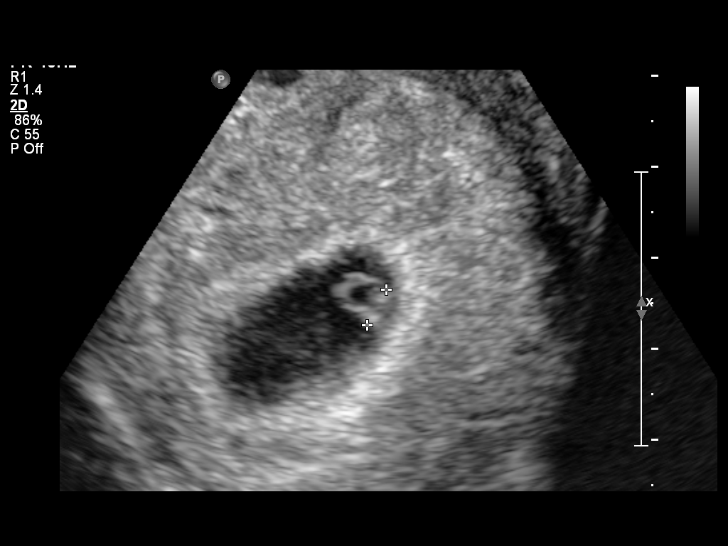
[im 21/40]
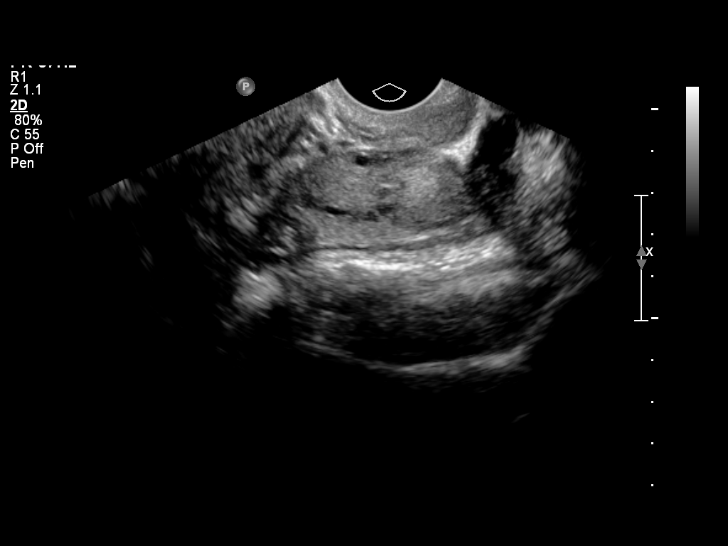
[im 24/40]
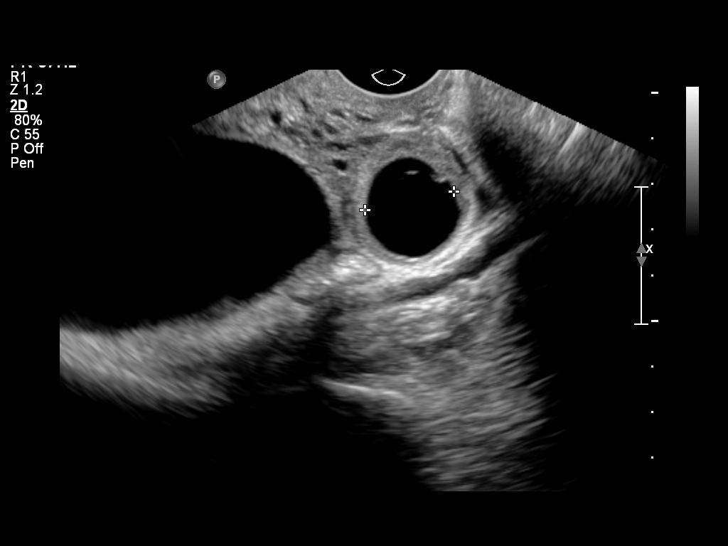
[im 27/40]
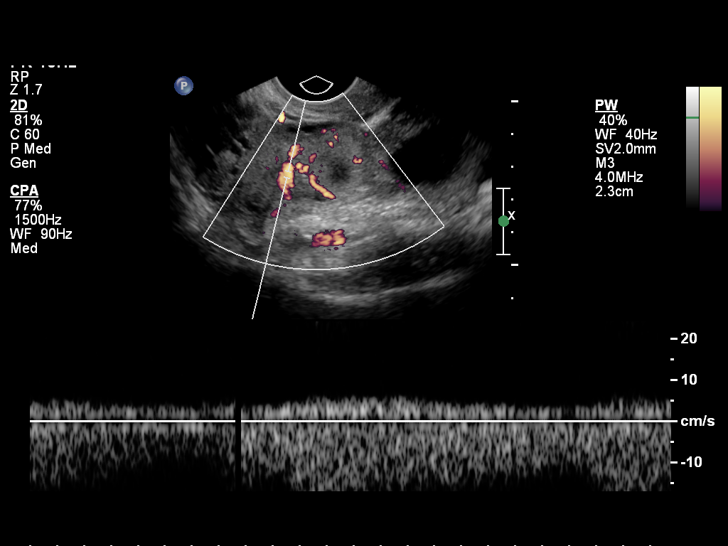
[im 29/40]
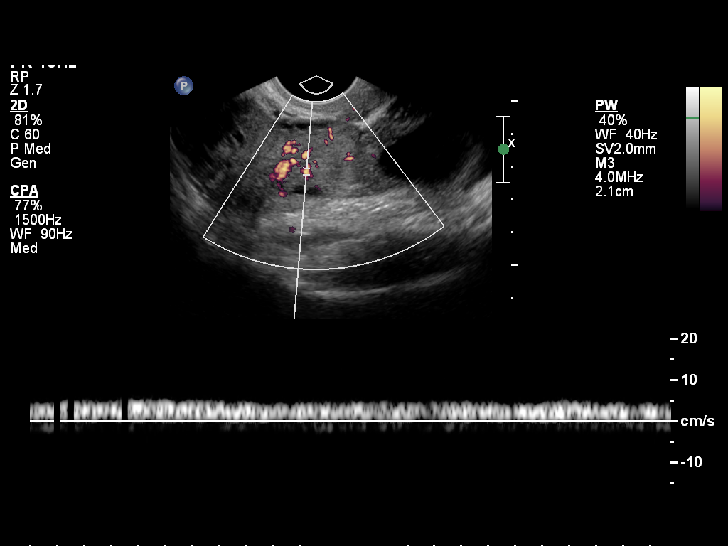
[im 32/40]
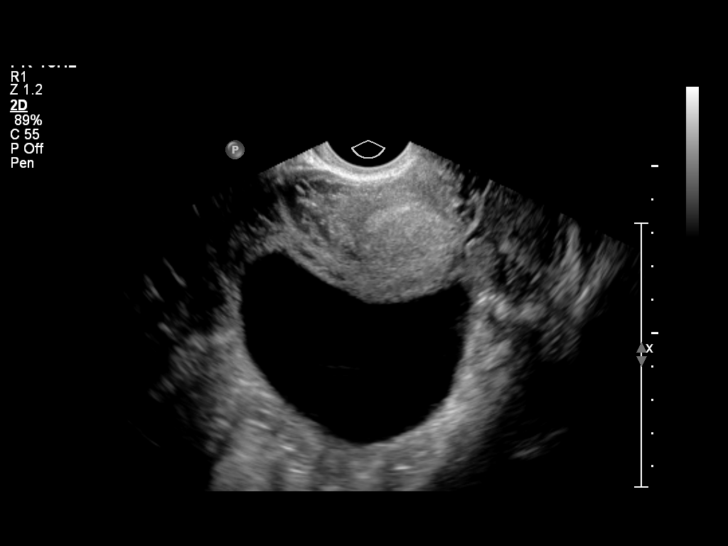
[im 35/40]
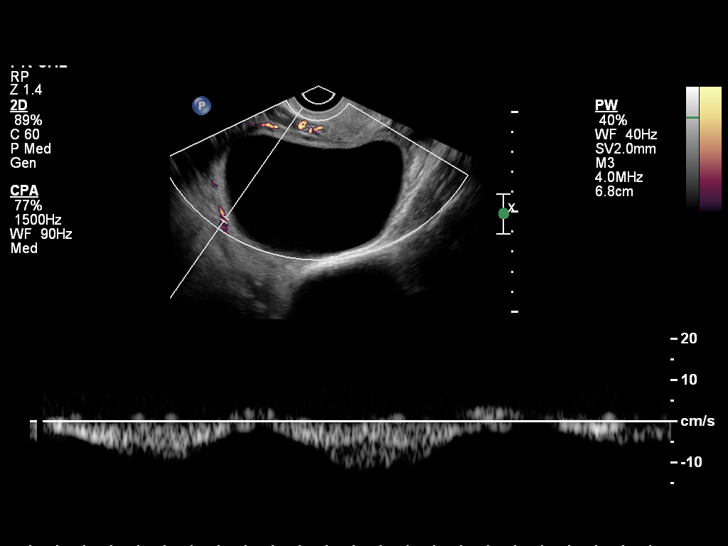
[im 38/40]
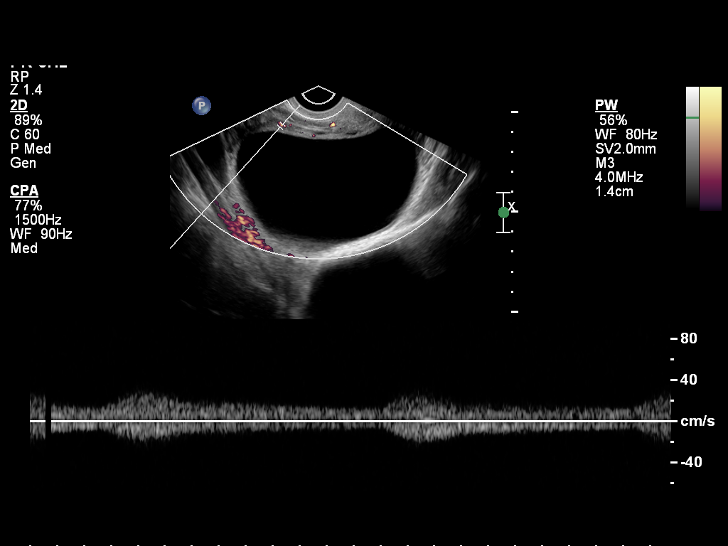

[13 of 28 positions shown; findings below may reference images not displayed]

FINDINGS: Intrauterine gestational sac: Single

Yolk sac:  Yes

Embryo:  Yes

Cardiac Activity: Yes

Heart Rate: 121 bpm

CRL:   4.3   mm   6 w 1 d                  US EDC: 06/17/2015

Maternal uterus/adnexae:

Subchorionic hemorrhage: None

Right ovary: Simple appearing cyst measures 11.1 x 5.8 x 8.0 cm.
Previously this measured 9.9 x 5.6 x 8.0 cm.

Left ovary: Corpus luteal cyst noted measuring 2.5 x 1.8 x 2.0 cm.

Other :None

Free fluid:  Trace free fluid

Pulsed Doppler evaluation of both ovaries demonstrates normal
appearing low-resistance arterial and venous waveforms.
IMPRESSION: 1. Single living intrauterine gestation with an estimated
gestational age of 6 weeks and 1 day.
2. Persistent large right ovarian cyst. This measures 11 cm in
greatest dimension, previously 10 cm.
3. There is normal blood flow to both ovaries.

## 2016-01-14 ENCOUNTER — Encounter (HOSPITAL_COMMUNITY): Payer: Self-pay | Admitting: *Deleted

## 2016-01-14 ENCOUNTER — Ambulatory Visit (HOSPITAL_COMMUNITY)
Admission: EM | Admit: 2016-01-14 | Discharge: 2016-01-14 | Disposition: A | Discharge status: Home / Self Care | Payer: BC Managed Care – PPO | Attending: Family Medicine | Admitting: Family Medicine

## 2016-01-14 DIAGNOSIS — R0781 Pleurodynia: Secondary | ICD-10-CM

## 2016-01-14 DIAGNOSIS — R0789 Other chest pain: Secondary | ICD-10-CM

## 2016-01-14 LAB — POCT URINALYSIS DIP (DEVICE)
Bilirubin Urine: NEGATIVE
GLUCOSE, UA: NEGATIVE mg/dL
HGB URINE DIPSTICK: NEGATIVE
KETONES UR: NEGATIVE mg/dL
Leukocytes, UA: NEGATIVE
Nitrite: NEGATIVE
PROTEIN: NEGATIVE mg/dL
SPECIFIC GRAVITY, URINE: 1.02 (ref 1.005–1.030)
Urobilinogen, UA: 1 mg/dL (ref 0.0–1.0)
pH: 7 (ref 5.0–8.0)

## 2016-01-14 LAB — POCT PREGNANCY, URINE: Preg Test, Ur: NEGATIVE

## 2016-01-14 MED ORDER — TRAMADOL HCL 50 MG PO TABS: 50.0000 mg | ORAL_TABLET | Freq: Four times a day (QID) | ORAL | Status: DC | PRN

## 2016-01-14 NOTE — ED Provider Notes (Signed)
CSN: [id number]     Arrival date & time 01/14/16  1346 History   First MD Initiated Contact with Patient 01/14/16 1551     Chief Complaint  Patient presents with  . Urinary Tract Infection   (Consider location/radiation/quality/duration/timing/severity/associated sxs/prior Treatment) HPI History obtained from patient:  Pt presents with the cc of:  Right flank pain  Duration of symptoms: Several days Treatment prior to arrival: Cranberry juice Context: Sudden onset of right flank pain with some blood in the urine. History of UTI in the past. Other symptoms include: Right rib pain Pain score: 2 FAMILY HISTORY: Family history of birth defects and breast cancer in grandmother    Past Medical History  Diagnosis Date  . Irregular heart beat 07/06/11  . UTI (lower urinary tract infection)   . PFO (patent foramen ovale)   . Abortion   . Ovarian cyst     R ovary  . Hx of varicella    Past Surgical History  Procedure Laterality Date  . No past surgeries    . Induced abortion     Family History  Problem Relation Age of Onset  . CAD Neg Hx   . Stroke Neg Hx   . Birth defects Paternal Grandmother     heart murmur   . Breast cancer Paternal Grandmother   . Diabetes Mellitus II Maternal Grandmother    Social History  Substance Use Topics  . Smoking status: Former Smoker -- 0.50 packs/day    Types: Cigarettes    Quit date: 10/20/2014  . Smokeless tobacco: Never Used  . Alcohol Use: No     Comment: not drinking since she found out she was pregnant   OB History    Gravida Para Term Preterm AB TAB SAB Ectopic Multiple Living   0 1     Review of Systems  Denies: HEADACHE, NAUSEA, ABDOMINAL PAIN, CHEST PAIN, CONGESTION, DYSURIA, SHORTNESS OF BREATH  Allergies  Cherry  Home Medications   Prior to Admission medications   Medication Sig Start Date End Date Taking? Authorizing Provider  albuterol (PROVENTIL HFA;VENTOLIN HFA) 108 (90 BASE) MCG/ACT inhaler Inhale  1-2 puffs into the lungs every 6 (six) hours as needed for wheezing or shortness of breath.    Historical Provider, MD  ibuprofen (ADVIL,MOTRIN) 600 MG tablet Take 1 tablet (600 mg total) by mouth every 6 (six) hours as needed. 06/10/15   Sherre Scarlet, CNM  norethindrone (ORTHO MICRONOR) 0.35 MG tablet Take 1 tablet (0.35 mg total) by mouth daily. Begin 4 weeks after delivery 06/10/15   Sherre Scarlet, CNM  Prenat-FeCbn-FeAsp-Meth-FA-DHA (PRENATE MINI) 18-0.6-0.4-350 MG CAPS Take 1 capsule by mouth at bedtime.  11/14/14   Historical Provider, MD  traMADol (ULTRAM) 50 MG tablet Take 1 tablet (50 mg total) by mouth every 6 (six) hours as needed. 01/14/16   Tharon Aquas, PA   Meds Ordered and Administered this Visit  Medications - No data to display  BP 104/75 mmHg  Pulse 80  Temp(Src) 98.4 F (36.9 C) (Oral)  Resp 16  SpO2 100%  LMP 01/01/2016 No data found.   Physical Exam NURSES NOTES AND VITAL SIGNS REVIEWED. CONSTITUTIONAL: Well developed, well nourished, no acute distress HEENT: normocephalic, atraumatic EYES: Conjunctiva normal NECK:normal ROM, supple, no adenopathy PULMONARY:No respiratory distress, normal effort ABDOMINAL: Soft, ND, NT BS+, No CVAT MUSCULOSKELETAL: Normal ROM of all [id number], There is slight tenderness noted to the right flank. SKIN: warm and dry without rash  PSYCHIATRIC: Mood and affect, behavior are normal  ED Course  Procedures (including critical care time)  Labs Review Labs Reviewed  POCT URINALYSIS DIP (DEVICE)  POCT PREGNANCY, URINE    I HAVE PERSONALLY REVIEWED TESTS RESULTS WITH PATIENT.   Imaging Review No results found.   Visual Acuity Review  Right Eye Distance:   Left Eye Distance:   Bilateral Distance:    Right Eye Near:   Left Eye Near:    Bilateral Near:       Prescription for tramadol provided.  MDM   1. Rib pain on right side     Patient is reassured that there are no issues that require transfer to  higher level of care at this time or additional tests. Patient is advised to continue home symptomatic treatment. Patient is advised that if there are new or worsening symptoms to attend the emergency department, contact primary care provider, or return to UC. Instructions of care provided discharged home in stable condition.    THIS NOTE WAS GENERATED USING A VOICE RECOGNITION SOFTWARE PROGRAM. ALL REASONABLE EFFORTS  WERE MADE TO PROOFREAD THIS DOCUMENT FOR ACCURACY.  I have verbally reviewed the discharge instructions with the patient. A printed AVS was given to the patient.  All questions were answered prior to discharge.      Tharon AquasFrank C Patrick, PA 01/14/16 2039

## 2016-01-14 NOTE — ED Notes (Addendum)
Pt  Reports   r  Flank  Pain   With   Some  Blood  In  Her  Urine   She  Has  Had  A  History  Of  uti    In past       Pt ambulatory   To       Room         Appearing  In no  Acute  Distress      symptoms      Not  releived  By  Cranberry  Juice  Or      h20

## 2016-01-14 NOTE — Discharge Instructions (Signed)
Chest Wall Pain °Chest wall pain is pain in or around the bones and muscles of your chest. Sometimes, an injury causes this pain. Sometimes, the cause may not be known. This pain may take several weeks or longer to get better. °HOME CARE °Pay attention to any changes in your symptoms. Take these actions to help with your pain: °· Rest as told by your doctor. °· Avoid activities that cause pain. Try not to use your chest, belly (abdominal), or side muscles to lift heavy things. °· If directed, apply ice to the painful area: °¨ Put ice in a plastic bag. °¨ Place a towel between your skin and the bag. °¨ Leave the ice on for 20 minutes, 2-3 times per day. °· Take over-the-counter and prescription medicines only as told by your doctor. °· Do not use tobacco products, including cigarettes, chewing tobacco, and e-cigarettes. If you need help quitting, ask your doctor. °· Keep all follow-up visits as told by your doctor. This is important. °GET HELP IF: °· You have a fever. °· Your chest pain gets worse. °· You have new symptoms. °GET HELP RIGHT AWAY IF: °· You feel sick to your stomach (nauseous) or you throw up (vomit). °· You feel sweaty or light-headed. °· You have a cough with phlegm (sputum) or you cough up blood. °· You are short of breath. °  °This information is not intended to replace advice given to you by your health care provider. Make sure you discuss any questions you have with your health care provider. °  °Document Released: 01/26/2008 Document Revised: 04/30/2015 Document Reviewed: 11/04/2014 °Elsevier Interactive Patient Education ©2016 Elsevier Inc. ° °

## 2016-01-20 ENCOUNTER — Ambulatory Visit (HOSPITAL_COMMUNITY)
Admission: EM | Admit: 2016-01-20 | Discharge: 2016-01-20 | Disposition: A | Discharge status: Home / Self Care | Payer: BC Managed Care – PPO | Attending: Family Medicine | Admitting: Family Medicine

## 2016-01-20 ENCOUNTER — Encounter (HOSPITAL_COMMUNITY): Payer: Self-pay | Admitting: Emergency Medicine

## 2016-01-20 DIAGNOSIS — M7918 Myalgia, other site: Secondary | ICD-10-CM

## 2016-01-20 DIAGNOSIS — M549 Dorsalgia, unspecified: Secondary | ICD-10-CM | POA: Diagnosis present

## 2016-01-20 DIAGNOSIS — M791 Myalgia: Secondary | ICD-10-CM | POA: Insufficient documentation

## 2016-01-20 DIAGNOSIS — M545 Low back pain, unspecified: Secondary | ICD-10-CM

## 2016-01-20 DIAGNOSIS — N760 Acute vaginitis: Secondary | ICD-10-CM | POA: Diagnosis not present

## 2016-01-20 DIAGNOSIS — Z87891 Personal history of nicotine dependence: Secondary | ICD-10-CM | POA: Insufficient documentation

## 2016-01-20 DIAGNOSIS — N898 Other specified noninflammatory disorders of vagina: Secondary | ICD-10-CM

## 2016-01-20 LAB — POCT URINALYSIS DIP (DEVICE)
Bilirubin Urine: NEGATIVE
Glucose, UA: NEGATIVE mg/dL
Hgb urine dipstick: NEGATIVE
KETONES UR: NEGATIVE mg/dL
Leukocytes, UA: NEGATIVE
NITRITE: NEGATIVE
PH: 7.5 (ref 5.0–8.0)
Protein, ur: NEGATIVE mg/dL
SPECIFIC GRAVITY, URINE: 1.015 (ref 1.005–1.030)
Urobilinogen, UA: 0.2 mg/dL (ref 0.0–1.0)

## 2016-01-20 MED ORDER — METRONIDAZOLE 500 MG PO TABS: 500.0000 mg | ORAL_TABLET | Freq: Two times a day (BID) | ORAL | Status: DC

## 2016-01-20 MED ORDER — AZITHROMYCIN 250 MG PO TABS: ORAL_TABLET | ORAL | Status: DC

## 2016-01-20 NOTE — ED Notes (Signed)
[facility] with a complaint of a vaginal discharge and lower back pain x 2 weeks. The patient stated that she was evaluated on 01/14/2016 for the same symptoms. The patient stated that she has frequent UTI's.

## 2016-01-20 NOTE — Discharge Instructions (Signed)
Back Pain, Adult °Back pain is very common in adults. The cause of back pain is rarely dangerous and the pain often gets better over time. The cause of your back pain may not be known. Some common causes of back pain include: °· Strain of the muscles or ligaments supporting the spine. °· Wear and tear (degeneration) of the spinal disks. °· Arthritis. °· Direct injury to the back. °For many people, back pain may return. Since back pain is rarely dangerous, most people can learn to manage this condition on their own. °HOME CARE INSTRUCTIONS °Watch your back pain for any changes. The following actions may help to lessen any discomfort you are feeling: °· Remain active. It is stressful on your back to sit or stand in one place for long periods of time. Do not sit, drive, or stand in one place for more than 30 minutes at a time. Take short walks on even surfaces as soon as you are able. Try to increase the length of time you walk each day. °· Exercise regularly as directed by your health care provider. Exercise helps your back heal faster. It also helps avoid future injury by keeping your muscles strong and flexible. °· Do not stay in bed. Resting more than 1-2 days can delay your recovery. °· Pay attention to your body when you bend and lift. The most comfortable positions are those that put less stress on your recovering back. Always use proper lifting techniques, including: °¨ Bending your knees. °¨ Keeping the load close to your body. °¨ Avoiding twisting. °· Find a comfortable position to sleep. Use a firm mattress and lie on your side with your knees slightly bent. If you lie on your back, put a pillow under your knees. °· Avoid feeling anxious or stressed. Stress increases muscle tension and can worsen back pain. It is important to recognize when you are anxious or stressed and learn ways to manage it, such as with exercise. °· Take medicines only as directed by your health care provider. Over-the-counter  medicines to reduce pain and inflammation are often the most helpful. Your health care provider may prescribe muscle relaxant drugs. These medicines help dull your pain so you can more quickly return to your normal activities and healthy exercise. °· Apply ice to the injured area: °¨ Put ice in a plastic bag. °¨ Place a towel between your skin and the bag. °¨ Leave the ice on for 20 minutes, 2-3 times a day for the first 2-3 days. After that, ice and heat may be alternated to reduce pain and spasms. °· Maintain a healthy weight. Excess weight puts extra stress on your back and makes it difficult to maintain good posture. °SEEK MEDICAL CARE IF: °· You have pain that is not relieved with rest or medicine. °· You have increasing pain going down into the legs or buttocks. °· You have pain that does not improve in one week. °· You have night pain. °· You lose weight. °· You have a fever or chills. °SEEK IMMEDIATE MEDICAL CARE IF:  °· You develop new bowel or bladder control problems. °· You have unusual weakness or numbness in your arms or legs. °· You develop nausea or vomiting. °· You develop abdominal pain. °· You feel faint. °  °This information is not intended to replace advice given to you by your health care provider. Make sure you discuss any questions you have with your health care provider. °  °Document Released: 08/09/2005 Document Revised: 08/30/2014 Document Reviewed: 12/11/2013 °Elsevier Interactive Patient Education ©2016 Elsevier   Inc.  Bacterial Vaginosis Bacterial vaginosis is an infection of the vagina. It happens when too many germs (bacteria) grow in the vagina. Having this infection puts you at risk for getting other infections from sex. Treating this infection can help lower your risk for other infections, such as:   Chlamydia.  Gonorrhea.  HIV.  Herpes. HOME CARE  Take your medicine as told by your doctor.  Finish your medicine even if you start to feel better.  Tell your sex  partner that you have an infection. They should see their doctor for treatment.  During treatment:  Avoid sex or use condoms correctly.  Do not douche.  Do not drink alcohol unless your doctor tells you it is ok.  Do not breastfeed unless your doctor tells you it is ok. GET HELP IF:  You are not getting better after 3 days of treatment.  You have more grey fluid (discharge) coming from your vagina than before.  You have more pain than before.  You have a fever. MAKE SURE YOU:   Understand these instructions.  Will watch your condition.  Will get help right away if you are not doing well or get worse.   This information is not intended to replace advice given to you by your health care provider. Make sure you discuss any questions you have with your health care provider.   Document Released: 05/18/2008 Document Revised: 08/30/2014 Document Reviewed: 03/21/2013 Elsevier Interactive Patient Education 2016 [employer]  Vaginitis Vaginitis is an inflammation of the vagina. It can happen when the normal bacteria and yeast in the vagina grow too much. There are different types. Treatment will depend on the type you have. HOME CARE  Take all medicines as told by your doctor.  Keep your vagina area clean and dry. Avoid soap. Rinse the area with water.  Avoid washing and cleaning out the vagina (douching).  Do not use tampons or have sex (intercourse) until your treatment is done.  Wipe from front to back after going to the restroom.  Wear cotton underwear.  Avoid wearing underwear while you sleep until your vaginitis is gone.  Avoid tight pants. Avoid underwear or nylons without a cotton panel.  Take off wet clothing (such as a bathing suit) as soon as you can.  Use mild, unscented products. Avoid fabric softeners and scented:  Feminine sprays.  Laundry detergents.  Tampons.  Soaps or bubble baths.  Practice safe sex and use condoms. GET HELP RIGHT AWAY IF:    You have belly (abdominal) pain.  You have a fever or lasting symptoms for more than 2-3 days.  You have a fever and your symptoms suddenly get worse. MAKE SURE YOU:   Understand these instructions.  Will watch this condition.  Will get help right away if you are not doing well or get worse.   This information is not intended to replace advice given to you by your health care provider. Make sure you discuss any questions you have with your health care provider.   Document Released: 11/05/2008 Document Revised: 05/03/2012 Document Reviewed: 01/20/2012 Elsevier Interactive Patient Education 2016 [employer]  Sexually Transmitted Disease A sexually transmitted disease (STD) is a disease or infection often passed to another person during sex. However, STDs can be passed through nonsexual ways. An STD can be passed through:  Spit (saliva).  Semen.  Blood.  Mucus from the vagina.  Pee (urine). HOW CAN I LESSEN MY CHANCES OF GETTING AN STD?  Use:  Latex  condoms.  Water-soluble lubricants with condoms. Do not use petroleum jelly or oils.  Dental dams. These are small pieces of latex that are used as a barrier during oral sex.  Avoid having more than one sex partner.  Do not have sex with someone who has other sex partners.  Do not have sex with anyone you do not know or who is at high risk for an STD.  Avoid risky sex that can break your skin.  Do not have sex if you have open sores on your mouth or skin.  Avoid drinking too much alcohol or taking illegal drugs. Alcohol and drugs can affect your good judgment.  Avoid oral and anal sex acts.  Get shots (vaccines) for HPV and hepatitis.  If you are at risk of being infected with HIV, it is advised that you take a certain medicine daily to prevent HIV infection. This is called pre-exposure prophylaxis (PrEP). You may be at risk if:  You are a man who has sex with other men (MSM).  You are attracted to the  opposite sex (heterosexual) and are having sex with more than one partner.  You take drugs with a needle.  You have sex with someone who has HIV.  Talk with your doctor about if you are at high risk of being infected with HIV. If you begin to take PrEP, get tested for HIV first. Get tested every 3 months for as long as you are taking PrEP.  Get tested for STDs every year if you are sexually active. If you are treated for an STD, get tested again 3 months after you are treated. WHAT SHOULD I DO IF I THINK I HAVE AN STD?  See your doctor.  Tell your sex partner(s) that you have an STD. They should be tested and treated.  Do not have sex until your doctor says it is okay. WHEN SHOULD I GET HELP? Get help right away if:  You have bad belly (abdominal) pain.  You are a man and have puffiness (swelling) or pain in your testicles.  You are a woman and have puffiness in your vagina.   This information is not intended to replace advice given to you by your health care provider. Make sure you discuss any questions you have with your health care provider.   Document Released: 09/16/2004 Document Revised: 08/30/2014 Document Reviewed: 02/02/2013 Elsevier Interactive Patient Education [employer]

## 2016-01-20 NOTE — ED Provider Notes (Signed)
CSN: [id number]     Arrival date & time 01/20/16  1722 History   First MD Initiated Contact with Patient 01/20/16 1824     Chief Complaint  Patient presents with  . Back Pain  . Vaginal Discharge   (Consider location/radiation/quality/duration/timing/severity/associated sxs/prior Treatment) HPI Comments: 25 year old female states she is here because of a UTI. She believes she has a UTI because she has pain across her lower back. She has had this pain for approximately 2 weeks. She was seen in the urgent care here 1 week ago with the same complaint. Urinalysis at that time as well as a urinalysis today is clear. She has no problems with dysuria, urinary urgency or frequency or other urinary symptoms. The pain across her lower most back is worse with either prolonged sitting or standing and when rising from a bending over position.  She also asked the question was causing mild vaginal discharge?    Past Medical History  Diagnosis Date  . Irregular heart beat 07/06/11  . UTI (lower urinary tract infection)   . PFO (patent foramen ovale)   . Abortion   . Ovarian cyst     R ovary  . Hx of varicella    Past Surgical History  Procedure Laterality Date  . No past surgeries    . Induced abortion     Family History  Problem Relation Age of Onset  . CAD Neg Hx   . Stroke Neg Hx   . Birth defects Paternal Grandmother     heart murmur   . Breast cancer Paternal Grandmother   . Diabetes Mellitus II Maternal Grandmother    Social History  Substance Use Topics  . Smoking status: Former Smoker -- 0.50 packs/day    Types: Cigarettes    Quit date: 10/20/2014  . Smokeless tobacco: Never Used  . Alcohol Use: No     Comment: not drinking since she found out she was pregnant   OB History    Gravida Para Term Preterm AB TAB SAB Ectopic Multiple Living   2 1 1  1 1    0 1     Review of Systems  Constitutional: Negative for activity change and fatigue.  HENT: Negative.   Genitourinary:  Positive for vaginal discharge. Negative for dysuria, frequency, hematuria, flank pain and pelvic pain.  Musculoskeletal: Positive for myalgias and back pain. Negative for neck pain.  Skin: Negative.   Neurological: Negative.   All other systems reviewed and are negative.   Allergies  Cherry  Home Medications   Prior to Admission medications   Medication Sig Start Date End Date Taking? Authorizing Provider  albuterol (PROVENTIL HFA;VENTOLIN HFA) 108 (90 BASE) MCG/ACT inhaler Inhale 1-2 puffs into the lungs every 6 (six) hours as needed for wheezing or shortness of breath.    Historical Provider, MD  azithromycin (ZITHROMAX) 250 MG tablet Take all 4 tabs po now 01/20/16   Hayden Rasmussenavid Anterrio Mccleery, NP  ibuprofen (ADVIL,MOTRIN) 600 MG tablet Take 1 tablet (600 mg total) by mouth every 6 (six) hours as needed. 06/10/15   Sherre ScarletKimberly Williams, CNM  metroNIDAZOLE (FLAGYL) 500 MG tablet Take 1 tablet (500 mg total) by mouth 2 (two) times daily. X 7 days 01/20/16   Hayden Rasmussenavid Ziv Welchel, NP  norethindrone (ORTHO MICRONOR) 0.35 MG tablet Take 1 tablet (0.35 mg total) by mouth daily. Begin 4 weeks after delivery 06/10/15   Sherre ScarletKimberly Williams, CNM  Prenat-FeCbn-FeAsp-Meth-FA-DHA (PRENATE MINI) 18-0.6-0.4-350 MG CAPS Take 1 capsule by mouth at bedtime.  11/14/14  Historical Provider, MD  traMADol (ULTRAM) 50 MG tablet Take 1 tablet (50 mg total) by mouth every 6 (six) hours as needed. 01/14/16   Tharon Aquas, PA   Meds Ordered and Administered this Visit  Medications - No data to display  BP 110/82 mmHg  Pulse 91  Temp(Src) 99.1 F (37.3 C) (Oral)  Resp 16  SpO2 97%  LMP 01/01/2016 (Exact Date) No data found.   Physical Exam  Constitutional: She is oriented to person, place, and time. She appears well-developed and well-nourished. No distress.  Neck: Normal range of motion. Neck supple.  Cardiovascular: Normal rate, regular rhythm and normal heart sounds.   Pulmonary/Chest: Effort normal and breath sounds normal.   Genitourinary:  Normal external female genitalia Cervix is midline and superior. Ectocervix is erythematous and friable. There is a very small amount of light gray PND discharge in the vaginal vault. No lesions. No CMT.  Musculoskeletal: She exhibits tenderness. She exhibits no edema.  Tenderness to deep palpation across the lower most back, para lumbosacral musculature. It is reproduced after bending over and then rising to a standing position. No spinal tenderness.  Neurological: She is alert and oriented to person, place, and time. She exhibits normal muscle tone.  Skin: Skin is warm and dry.  Psychiatric: She has a normal mood and affect.  Nursing note and vitals reviewed.   ED Course  Procedures (including critical care time)  Labs Review Labs Reviewed  POCT URINALYSIS DIP (DEVICE)  CERVICOVAGINAL ANCILLARY ONLY    Imaging Review No results found.   Visual Acuity Review  Right Eye Distance:   Left Eye Distance:   Bilateral Distance:    Right Eye Near:   Left Eye Near:    Bilateral Near:         MDM   1. Bilateral low back pain without sciatica   2. Muscle pain, lumbar   3. Vaginitis   4. Vaginal discharge    Meds ordered this encounter  Medications  . azithromycin (ZITHROMAX) 250 MG tablet    Sig: Take all 4 tabs po now    Dispense:  4 each    Refill:  0    Order Specific Question:  Supervising Provider    Answer:  Linna Hoff [id number]  . metroNIDAZOLE (FLAGYL) 500 MG tablet    Sig: Take 1 tablet (500 mg total) by mouth 2 (two) times daily. X 7 days    Dispense:  14 tablet    Refill:  0    Order Specific Question:  Supervising Provider    Answer:  Bradd Canary D [[id number]]   Heat to your back. Stretches. Ibuprofen 600 mg every 6 hours as needed. Obtain a primary care doctor in follow-up since possible. Cervical cytology pending.    Hayden Rasmussen, NP 01/20/16 1907

## 2016-01-22 LAB — CERVICOVAGINAL ANCILLARY ONLY
CHLAMYDIA, DNA PROBE: NEGATIVE
Neisseria Gonorrhea: NEGATIVE
WET PREP (BD AFFIRM): POSITIVE — AB

## 2016-01-27 ENCOUNTER — Telehealth (HOSPITAL_COMMUNITY): Payer: Self-pay | Admitting: Emergency Medicine

## 2016-01-27 NOTE — ED Notes (Signed)
Called pt and notified of recent lab results from visit 5/30 Pt ID'd properly... Reports feeling better and sx have subsided  Per Dr. Piedad ClimesHonig,  Notes Recorded by Charm RingsErin J Honig, MD on 01/23/2016 at 4:16 PM Please notify patient that testing is positive for BV. She should complete the course of flagyl [facility]  Adv pt if sx are not getting better to return  Pt verb understanding Education on safe sex given

## 2016-10-19 ENCOUNTER — Encounter: Payer: Self-pay | Admitting: Obstetrics

## 2016-10-19 ENCOUNTER — Ambulatory Visit (INDEPENDENT_AMBULATORY_CARE_PROVIDER_SITE_OTHER): Payer: Medicaid Other | Admitting: Obstetrics

## 2016-10-19 ENCOUNTER — Other Ambulatory Visit (HOSPITAL_COMMUNITY)
Admission: RE | Admit: 2016-10-19 | Discharge: 2016-10-19 | Disposition: A | Discharge status: Home / Self Care | Payer: Medicaid Other | Source: Ambulatory Visit | Attending: Obstetrics | Admitting: Obstetrics

## 2016-10-19 VITALS — BP 128/77 | HR 90 | Ht 67.5 in | Wt 156.0 lb

## 2016-10-19 DIAGNOSIS — Z113 Encounter for screening for infections with a predominantly sexual mode of transmission: Secondary | ICD-10-CM

## 2016-10-19 DIAGNOSIS — Z309 Encounter for contraceptive management, unspecified: Secondary | ICD-10-CM

## 2016-10-19 DIAGNOSIS — Z0000 Encounter for general adult medical examination without abnormal findings: Secondary | ICD-10-CM | POA: Diagnosis not present

## 2016-10-19 DIAGNOSIS — Z124 Encounter for screening for malignant neoplasm of cervix: Secondary | ICD-10-CM

## 2016-10-19 DIAGNOSIS — Z01419 Encounter for gynecological examination (general) (routine) without abnormal findings: Secondary | ICD-10-CM | POA: Insufficient documentation

## 2016-10-19 NOTE — Progress Notes (Signed)
Subjective:        Veronica Knight is a 26 y.o. female here for a routine exam.  Current complaints: None.    Personal health questionnaire:  Is patient Ashkenazi Jewish, have a family history of breast and/or ovarian cancer: no Is there a family history of uterine cancer diagnosed at age < 9, gastrointestinal cancer, urinary tract cancer, family member who is a Personnel officer syndrome-associated carrier: no Is the patient overweight and hypertensive, family history of diabetes, personal history of gestational diabetes, preeclampsia or PCOS: no Is patient over 52, have PCOS,  family history of premature CHD under age 60, diabetes, smoke, have hypertension or peripheral artery disease:  no At any time, has a partner hit, kicked or otherwise hurt or frightened you?: no Over the past 2 weeks, have you felt down, depressed or hopeless?: no Over the past 2 weeks, have you felt little interest or pleasure in doing things?:no   Gynecologic History Patient's last menstrual period was 09/29/2016. Contraception: none Last Pap: unknown. Results were: unknown Last mammogram: n/a. Results were: n/a  Obstetric History OB History  Gravida Para Term Preterm AB Living  2 1 1   1 1   SAB TAB Ectopic Multiple Live Births    1   0 1    # Outcome Date GA Lbr Len/2nd Weight Sex Delivery Anes PTL Lv  2 Term 06/08/15 [redacted]w[redacted]d 04:29 / 00:16 6 lb 0.1 oz (2.725 kg) M Vag-Spont None  LIV  1 TAB              Birth Comments: System Generated. Please review and update pregnancy details.      Past Medical History:  Diagnosis Date  . Abortion   . Hx of varicella   . Irregular heart beat 07/06/11  . Ovarian cyst    R ovary  . PFO (patent foramen ovale)   . UTI (lower urinary tract infection)     Past Surgical History:  Procedure Laterality Date  . INDUCED ABORTION    . NO PAST SURGERIES       Current Outpatient Prescriptions:  .  albuterol (PROVENTIL HFA;VENTOLIN HFA) 108 (90 BASE) MCG/ACT inhaler,  Inhale 1-2 puffs into the lungs every 6 (six) hours as needed for wheezing or shortness of breath., Disp: , Rfl:  Allergies  Allergen Reactions  . Cherry Anaphylaxis, Swelling and Other (See Comments)    Childhood allergy; reaction unknown, tongue swelling. 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    Social History  Substance Use Topics  . Smoking status: Current Every Day Smoker    Packs/day: 1.00    Types: Cigarettes  . Smokeless tobacco: Never Used  . Alcohol use No     Comment: socially     Family History  Problem Relation Age of Onset  . Birth defects Paternal Grandmother     heart murmur   . Breast cancer Paternal Grandmother   . Diabetes Mellitus II Maternal Grandmother   . CAD Neg Hx   . Stroke Neg Hx       Review of Systems  Constitutional: negative for fatigue and weight loss Respiratory: negative for cough and wheezing Cardiovascular: negative for chest pain, fatigue and palpitations Gastrointestinal: negative for abdominal pain and change in bowel habits Musculoskeletal:negative for myalgias Neurological: negative for gait problems and tremors Behavioral/Psych: negative for abusive relationship, depression Endocrine: negative for temperature intolerance    Genitourinary:negative for abnormal menstrual periods, genital lesions, hot flashes, sexual problems and  vaginal discharge Integument/breast: negative for breast lump, breast tenderness, nipple discharge and skin lesion(s)    Objective:       BP 128/77   Pulse 90   Ht 5' 7.5" (1.715 m)   Wt 156 lb (70.8 kg)   LMP 09/29/2016   BMI 24.07 kg/m  General:   alert  Skin:   no rash or abnormalities  Lungs:   clear to auscultation bilaterally  Heart:   regular rate and rhythm, S1, S2 normal, no murmur, click, rub or gallop  Breasts:   normal without suspicious masses, skin or nipple changes or axillary nodes  Abdomen:  normal findings: no organomegaly, soft, non-tender and no hernia   Pelvis:  External genitalia: normal general appearance Urinary system: urethral meatus normal and bladder without fullness, nontender Vaginal: normal without tenderness, induration or masses Cervix: normal appearance Adnexa: normal bimanual exam Uterus: anteverted and non-tender, normal size   Lab Review Urine pregnancy test Labs reviewed yes Radiologic studies reviewed no  50% of 20 min visit spent on counseling and coordination of care.    Assessment:    Healthy female exam.    Contraceptive Counseling and Advice.  Does not want contraception.   Plan:    Education reviewed: calcium supplements, low fat, low cholesterol diet, safe sex/STD prevention, self breast exams, smoking cessation and weight bearing exercise. Contraception: none. Follow up in: 1 year.   No orders of the defined types were placed in this encounter.  Orders Placed This Encounter  Procedures  . Hepatitis B surface antigen  . Hepatitis C antibody  . HIV antibody  . RPR     Patient ID: Veronica Knight, female   DOB: 1991-07-15, 26 y.o.   MRN: [id number]

## 2016-10-19 NOTE — Progress Notes (Signed)
New pt presents Annual, pap, and STD testing.

## 2016-10-20 LAB — HIV ANTIBODY (ROUTINE TESTING W REFLEX): HIV Screen 4th Generation wRfx: NONREACTIVE

## 2016-10-20 LAB — HEPATITIS C ANTIBODY: Hep C Virus Ab: <0.1 s/co ratio (ref 0.0–0.9)

## 2016-10-20 LAB — RPR: RPR Ser Ql: NONREACTIVE

## 2016-10-20 LAB — CYTOLOGY - PAP
CHLAMYDIA, DNA PROBE: NEGATIVE
Diagnosis: NEGATIVE
NEISSERIA GONORRHEA: NEGATIVE

## 2016-10-20 LAB — HEPATITIS B SURFACE ANTIGEN: HEP B S AG: NEGATIVE

## 2016-10-27 ENCOUNTER — Telehealth: Payer: Self-pay

## 2016-10-27 NOTE — Telephone Encounter (Signed)
Patient called in for lab results  ?

## 2016-12-30 ENCOUNTER — Encounter (HOSPITAL_COMMUNITY): Payer: Self-pay | Admitting: Emergency Medicine

## 2016-12-30 DIAGNOSIS — R112 Nausea with vomiting, unspecified: Secondary | ICD-10-CM | POA: Insufficient documentation

## 2016-12-30 DIAGNOSIS — F17210 Nicotine dependence, cigarettes, uncomplicated: Secondary | ICD-10-CM | POA: Insufficient documentation

## 2016-12-30 DIAGNOSIS — E860 Dehydration: Secondary | ICD-10-CM | POA: Insufficient documentation

## 2016-12-30 DIAGNOSIS — R109 Unspecified abdominal pain: Secondary | ICD-10-CM | POA: Insufficient documentation

## 2016-12-30 NOTE — ED Triage Notes (Signed)
c/o n/v/d and mid abdominal cramping for 2 days.  Denies any fevers.  Refusing zofran in triage.  States I'm okay.

## 2016-12-31 ENCOUNTER — Emergency Department (HOSPITAL_COMMUNITY)
Admission: EM | Admit: 2016-12-31 | Discharge: 2016-12-31 | Disposition: A | Discharge status: Home / Self Care | Payer: Medicaid Other | Attending: Emergency Medicine | Admitting: Emergency Medicine

## 2016-12-31 DIAGNOSIS — R197 Diarrhea, unspecified: Secondary | ICD-10-CM

## 2016-12-31 DIAGNOSIS — R112 Nausea with vomiting, unspecified: Secondary | ICD-10-CM

## 2016-12-31 DIAGNOSIS — E860 Dehydration: Secondary | ICD-10-CM

## 2016-12-31 LAB — COMPREHENSIVE METABOLIC PANEL
ALK PHOS: 45 U/L (ref 38–126)
ALT: 15 U/L (ref 14–54)
AST: 23 U/L (ref 15–41)
Albumin: 4.2 g/dL (ref 3.5–5.0)
Anion gap: 11 (ref 5–15)
BILIRUBIN TOTAL: 0.6 mg/dL (ref 0.3–1.2)
BUN: 7 mg/dL (ref 6–20)
CALCIUM: 8.9 mg/dL (ref 8.9–10.3)
CO2: 23 mmol/L (ref 22–32)
Chloride: 103 mmol/L (ref 101–111)
Creatinine, Ser: 0.73 mg/dL (ref 0.44–1.00)
GFR calc Af Amer: >60 mL/min (ref >60)
GFR calc non Af Amer: >60 mL/min (ref >60)
GLUCOSE: 97 mg/dL (ref 65–99)
Potassium: 3.1 mmol/L — ABNORMAL LOW (ref 3.5–5.1)
Sodium: 137 mmol/L (ref 135–145)
TOTAL PROTEIN: 7.3 g/dL (ref 6.5–8.1)

## 2016-12-31 LAB — CBC
HCT: 42.4 % (ref 36.0–46.0)
Hemoglobin: 14.6 g/dL (ref 12.0–15.0)
MCH: 30.6 pg (ref 26.0–34.0)
MCHC: 34.4 g/dL (ref 30.0–36.0)
MCV: 88.9 fL (ref 78.0–100.0)
Platelets: 229 10*3/uL (ref 150–400)
RBC: 4.77 MIL/uL (ref 3.87–5.11)
RDW: 14.5 % (ref 11.5–15.5)
WBC: 6.7 10*3/uL (ref 4.0–10.5)

## 2016-12-31 LAB — URINALYSIS, ROUTINE W REFLEX MICROSCOPIC
Bilirubin Urine: NEGATIVE
Glucose, UA: NEGATIVE mg/dL
Hgb urine dipstick: NEGATIVE
Ketones, ur: NEGATIVE mg/dL
NITRITE: NEGATIVE
PROTEIN: 30 mg/dL — AB
SPECIFIC GRAVITY, URINE: 1.028 (ref 1.005–1.030)
pH: 5 (ref 5.0–8.0)

## 2016-12-31 LAB — LIPASE, BLOOD: Lipase: 24 U/L (ref 11–51)

## 2016-12-31 LAB — PREGNANCY, URINE: PREG TEST UR: NEGATIVE

## 2016-12-31 MED ORDER — ONDANSETRON 8 MG PO TBDP: 8.0000 mg | ORAL_TABLET | Freq: Three times a day (TID) | ORAL | 0 refills | Status: DC | PRN

## 2016-12-31 MED ORDER — ONDANSETRON HCL 4 MG/2ML IJ SOLN
4.0000 mg | Freq: Once | INTRAMUSCULAR | Status: AC
  Administered 2016-12-31: 4 mg via INTRAVENOUS
  Filled 2016-12-31: qty 2

## 2016-12-31 MED ORDER — KETOROLAC TROMETHAMINE 30 MG/ML IJ SOLN
30.0000 mg | Freq: Once | INTRAMUSCULAR | Status: AC
  Administered 2016-12-31: 30 mg via INTRAVENOUS
  Filled 2016-12-31: qty 1

## 2016-12-31 MED ORDER — SODIUM CHLORIDE 0.9 % IV BOLUS (SEPSIS)
1000.0000 mL | Freq: Once | INTRAVENOUS | Status: AC
  Administered 2016-12-31: 1000 mL via INTRAVENOUS

## 2016-12-31 NOTE — ED Notes (Signed)
Pt ambulatory to the restroom with steady gait at this time.

## 2016-12-31 NOTE — ED Provider Notes (Signed)
[facility]-EMERGENCY DEPT Provider Note   CSN: [id number] Arrival date & time: 12/30/16  2351   By signing my name below, I, Cynda AcresHailei Fulton, attest that this documentation has been prepared under the direction and in the presence of Azalia Bilisampos, Estelene Carmack, MD. Electronically Signed: Cynda AcresHailei Fulton, Scribe. 12/31/16. 2:49 AM.  History   Chief Complaint Chief Complaint  Patient presents with  . Abdominal Pain  . Nausea  . Emesis   HPI Comments: Veronica Knight is a 26 y.o. female with a history of an ovarian cyst, who presents to the Emergency Department complaining of sudden-onset, constant abdominal "cramping" that began 1.5 days ago. Patient reports sick contacts with son who is in daycare. Patient repots associated nausea, vomiting, headache, and watery diarrhea. No modifying factors indicated. Patient denies any fever or chills.   The history is provided by the patient. No language interpreter was used.    Past Medical History:  Diagnosis Date  . Abortion   . Hx of varicella   . Irregular heart beat 07/06/11  . Ovarian cyst    R ovary  . PFO (patent foramen ovale)   . UTI (lower urinary tract infection)     Patient Active Problem List   Diagnosis Date Noted  . Vaginal delivery 06/08/2015  . Syncope during previous pregnancy--negative cardiac w/u 11/05/2013    Past Surgical History:  Procedure Laterality Date  . INDUCED ABORTION    . NO PAST SURGERIES      OB History    Gravida Para Term Preterm AB Living   2 1 1   1 1    SAB TAB Ectopic Multiple Live Births     1   0 1       Home Medications    Prior to Admission medications   Medication Sig Start Date End Date Taking? Authorizing Provider  albuterol (PROVENTIL HFA;VENTOLIN HFA) 108 (90 BASE) MCG/ACT inhaler Inhale 1-2 puffs into the lungs every 6 (six) hours as needed for wheezing or shortness of breath.    [provider]    Family History Family History  Problem Relation Age of Onset  . Birth defects  Paternal Grandmother        heart murmur   . Breast cancer Paternal Grandmother   . Diabetes Mellitus II Maternal Grandmother   . CAD Neg Hx   . Stroke Neg Hx     Social History Social History  Substance Use Topics  . Smoking status: Current Every Day Smoker    Packs/day: 1.00    Types: Cigarettes  . Smokeless tobacco: Never Used  . Alcohol use No     Comment: socially      Allergies   Cherry   Review of Systems Review of Systems  A complete 10 system review of systems was obtained and all systems are negative except as noted in the HPI and PMH.    Physical Exam Updated Vital Signs BP (!) 120/96 (BP Location: Left Arm)   Pulse 87   Temp 98.4 F (36.9 C) (Oral)   Resp 17   Ht 5' 7.5" (1.715 m)   Wt 160 lb (72.6 kg)   LMP 12/27/2016   SpO2 99%   BMI 24.69 kg/m   Physical Exam  Constitutional: She is oriented to person, place, and time. She appears well-developed and well-nourished. No distress.  HENT:  Head: Normocephalic and atraumatic.  Eyes: EOM are normal.  Neck: Normal range of motion.  Cardiovascular: Normal rate, regular rhythm and normal heart sounds.  Pulmonary/Chest: Effort normal and breath sounds normal.  Abdominal: Soft. She exhibits no distension. There is no tenderness.  Musculoskeletal: Normal range of motion.  Neurological: She is alert and oriented to person, place, and time.  Skin: Skin is warm and dry.  Psychiatric: She has a normal mood and affect. Judgment normal.  Nursing note and vitals reviewed.    ED Treatments / Results  DIAGNOSTIC STUDIES: Oxygen Saturation is 99% on RA, normal by my interpretation.    COORDINATION OF CARE: 2:49 AM Discussed treatment plan with pt at bedside and pt agreed to plan, which includes IV fluids.   Labs (all labs ordered are listed, but only abnormal results are displayed) Labs Reviewed  COMPREHENSIVE METABOLIC PANEL - Abnormal; Notable for the following:       Result Value   Potassium 3.1  (*)    All other components within normal limits  URINALYSIS, ROUTINE W REFLEX MICROSCOPIC - Abnormal; Notable for the following:    Color, Urine AMBER (*)    APPearance HAZY (*)    Protein, ur 30 (*)    Leukocytes, UA TRACE (*)    Bacteria, UA RARE (*)    Squamous Epithelial / LPF 0-5 (*)    All other components within normal limits  LIPASE, BLOOD  CBC  PREGNANCY, URINE    EKG  EKG Interpretation None       Radiology No results found.  Procedures Procedures (including critical care time)  Medications Ordered in ED Medications  ondansetron (ZOFRAN) injection 4 mg (4 mg Intravenous Given 12/31/16 0201)  sodium chloride 0.9 % bolus 1,000 mL (0 mLs Intravenous Stopped 12/31/16 0313)  ketorolac (TORADOL) 30 MG/ML injection 30 mg (30 mg Intravenous Given 12/31/16 0254)     Initial Impression / Assessment and Plan / ED Course  I have reviewed the triage vital signs and the nursing notes.  Pertinent labs & imaging results that were available during my care of the patient were reviewed by me and considered in my medical decision making (see chart for details).     5:40 AM Patient feels much better this time.  Discharge home in good condition.  Likely viral illness.  Home with primary care follow-up.  Zofran when necessary nausea.  Patient understands to return to the ER for new or worsening symptoms  Final Clinical Impressions(s) / ED Diagnoses   Final diagnoses:  Nausea vomiting and diarrhea  Dehydration    New Prescriptions New Prescriptions   ONDANSETRON (ZOFRAN ODT) 8 MG DISINTEGRATING TABLET    Take 1 tablet (8 mg total) by mouth every 8 (eight) hours as needed for nausea or vomiting.   I personally performed the services described in this documentation, which was scribed in my presence. The recorded information has been reviewed and is accurate.        Azalia Bilis, MD 12/31/16 [phone]

## 2017-01-11 ENCOUNTER — Emergency Department (HOSPITAL_COMMUNITY): Payer: No Typology Code available for payment source

## 2017-01-11 ENCOUNTER — Emergency Department (HOSPITAL_COMMUNITY)
Admission: EM | Admit: 2017-01-11 | Discharge: 2017-01-12 | Disposition: A | Discharge status: Other Acute Inpatient Hospital | Payer: No Typology Code available for payment source | Attending: Emergency Medicine | Admitting: Emergency Medicine

## 2017-01-11 DIAGNOSIS — R791 Abnormal coagulation profile: Secondary | ICD-10-CM | POA: Insufficient documentation

## 2017-01-11 DIAGNOSIS — F29 Unspecified psychosis not due to a substance or known physiological condition: Secondary | ICD-10-CM

## 2017-01-11 DIAGNOSIS — Y92410 Unspecified street and highway as the place of occurrence of the external cause: Secondary | ICD-10-CM | POA: Diagnosis not present

## 2017-01-11 DIAGNOSIS — Y9389 Activity, other specified: Secondary | ICD-10-CM | POA: Diagnosis not present

## 2017-01-11 DIAGNOSIS — R4182 Altered mental status, unspecified: Secondary | ICD-10-CM | POA: Diagnosis present

## 2017-01-11 DIAGNOSIS — F430 Acute stress reaction: Secondary | ICD-10-CM | POA: Insufficient documentation

## 2017-01-11 DIAGNOSIS — F322 Major depressive disorder, single episode, severe without psychotic features: Secondary | ICD-10-CM | POA: Diagnosis present

## 2017-01-11 DIAGNOSIS — Y999 Unspecified external cause status: Secondary | ICD-10-CM | POA: Insufficient documentation

## 2017-01-11 LAB — RAPID URINE DRUG SCREEN, HOSP PERFORMED
AMPHETAMINES: NOT DETECTED
Barbiturates: NOT DETECTED
Benzodiazepines: NOT DETECTED
COCAINE: NOT DETECTED
Opiates: NOT DETECTED
TETRAHYDROCANNABINOL: POSITIVE — AB

## 2017-01-11 LAB — URINALYSIS, ROUTINE W REFLEX MICROSCOPIC
Glucose, UA: NEGATIVE mg/dL
Hgb urine dipstick: NEGATIVE
KETONES UR: 80 mg/dL — AB
Nitrite: NEGATIVE
Protein, ur: 100 mg/dL — AB
Specific Gravity, Urine: 1.035 — ABNORMAL HIGH (ref 1.005–1.030)
pH: 5 (ref 5.0–8.0)

## 2017-01-11 LAB — I-STAT CHEM 8, ED
BUN: 10 mg/dL (ref 6–20)
CALCIUM ION: 1.14 mmol/L — AB (ref 1.15–1.40)
CHLORIDE: 104 mmol/L (ref 101–111)
Creatinine, Ser: 0.6 mg/dL (ref 0.44–1.00)
Glucose, Bld: 99 mg/dL (ref 65–99)
HCT: 47 % — ABNORMAL HIGH (ref 36.0–46.0)
HEMOGLOBIN: 16 g/dL — AB (ref 12.0–15.0)
Potassium: 3.5 mmol/L (ref 3.5–5.1)
SODIUM: 139 mmol/L (ref 135–145)
TCO2: 22 mmol/L (ref 0–100)

## 2017-01-11 LAB — TSH: TSH: 0.604 u[IU]/mL (ref 0.350–4.500)

## 2017-01-11 LAB — COMPREHENSIVE METABOLIC PANEL
ALBUMIN: 4.5 g/dL (ref 3.5–5.0)
ALT: 16 U/L (ref 14–54)
ANION GAP: 10 (ref 5–15)
AST: 21 U/L (ref 15–41)
Alkaline Phosphatase: 43 U/L (ref 38–126)
BUN: 9 mg/dL (ref 6–20)
CO2: 20 mmol/L — AB (ref 22–32)
Calcium: 9.6 mg/dL (ref 8.9–10.3)
Chloride: 105 mmol/L (ref 101–111)
Creatinine, Ser: 0.81 mg/dL (ref 0.44–1.00)
GFR calc Af Amer: >60 mL/min (ref >60)
GFR calc non Af Amer: >60 mL/min (ref >60)
GLUCOSE: 95 mg/dL (ref 65–99)
POTASSIUM: 3.5 mmol/L (ref 3.5–5.1)
SODIUM: 135 mmol/L (ref 135–145)
Total Bilirubin: 0.9 mg/dL (ref 0.3–1.2)
Total Protein: 7.8 g/dL (ref 6.5–8.1)

## 2017-01-11 LAB — CBC
HEMATOCRIT: 43.5 % (ref 36.0–46.0)
HEMOGLOBIN: 15.2 g/dL — AB (ref 12.0–15.0)
MCH: 30.5 pg (ref 26.0–34.0)
MCHC: 34.9 g/dL (ref 30.0–36.0)
MCV: 87.3 fL (ref 78.0–100.0)
Platelets: 259 10*3/uL (ref 150–400)
RBC: 4.98 MIL/uL (ref 3.87–5.11)
RDW: 14.1 % (ref 11.5–15.5)
WBC: 6 10*3/uL (ref 4.0–10.5)

## 2017-01-11 LAB — PROTIME-INR
INR: 1.1
PROTHROMBIN TIME: 14.2 s (ref 11.4–15.2)

## 2017-01-11 LAB — SAMPLE TO BLOOD BANK

## 2017-01-11 LAB — ETHANOL: Alcohol, Ethyl (B): <5 mg/dL (ref <5)

## 2017-01-11 LAB — T4, FREE: FREE T4: 1.11 ng/dL (ref 0.61–1.12)

## 2017-01-11 LAB — I-STAT CG4 LACTIC ACID, ED: Lactic Acid, Venous: 1.57 mmol/L (ref 0.5–1.9)

## 2017-01-11 LAB — SALICYLATE LEVEL: Salicylate Lvl: <7 mg/dL (ref 2.8–30.0)

## 2017-01-11 LAB — ACETAMINOPHEN LEVEL

## 2017-01-11 MED ORDER — ZIPRASIDONE MESYLATE 20 MG IM SOLR
20.0000 mg | Freq: Once | INTRAMUSCULAR | Status: AC
  Administered 2017-01-11: 20 mg via INTRAMUSCULAR
  Filled 2017-01-11: qty 20
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
## 2017-01-11 NOTE — Progress Notes (Signed)
Orthopedic Tech Progress Note Patient Details:  Veronica Knight 09/17/1990 [id number]  Patient ID: Veronica Harderlivia Fox Knight, female   DOB: 09/17/1990, 26 y.o.   MRN: [id number]   Nikki DomCrawford, Anselmo Reihl 01/11/2017, 12:53 PM Made level 2 trauma visit

## 2017-01-11 NOTE — ED Notes (Signed)
Pt allowed to make phone call. °

## 2017-01-11 NOTE — ED Notes (Signed)
[phone], Veronica Knight, Mother, called wanted an update.

## 2017-01-11 NOTE — ED Notes (Signed)
Exposure panel drawn and sent to lab

## 2017-01-11 NOTE — ED Notes (Signed)
Pt taken to CT with this RN tech and security.
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
## 2017-01-11 NOTE — ED Notes (Addendum)
Officer [place] arrived to serve IVC papers.
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
## 2017-01-11 NOTE — ED Notes (Signed)
Cell phone, wallet and cards locked up with security.

## 2017-01-12 ENCOUNTER — Inpatient Hospital Stay (HOSPITAL_COMMUNITY)
Admission: AD | Admit: 2017-01-12 | Discharge: 2017-01-13 | DRG: 885 | Disposition: A | Discharge status: Still In Hospital | Payer: Self-pay | Attending: Internal Medicine | Admitting: Internal Medicine

## 2017-01-12 DIAGNOSIS — F322 Major depressive disorder, single episode, severe without psychotic features: Secondary | ICD-10-CM | POA: Diagnosis present

## 2017-01-12 DIAGNOSIS — F3163 Bipolar disorder, current episode mixed, severe, without psychotic features: Principal | ICD-10-CM | POA: Diagnosis present

## 2017-01-12 DIAGNOSIS — M6282 Rhabdomyolysis: Secondary | ICD-10-CM | POA: Diagnosis present

## 2017-01-12 DIAGNOSIS — R000 Tachycardia, unspecified: Secondary | ICD-10-CM | POA: Diagnosis present

## 2017-01-12 HISTORY — DX: Depression, unspecified: F32.A

## 2017-01-12 HISTORY — DX: Major depressive disorder, single episode, unspecified: F32.9

## 2017-01-12 HISTORY — DX: Bipolar disorder, unspecified: F31.9

## 2017-01-12 MED ORDER — DIPHENHYDRAMINE HCL 50 MG/ML IJ SOLN
50.0000 mg | Freq: Once | INTRAMUSCULAR | Status: AC
  Administered 2017-01-13: 50 mg via INTRAMUSCULAR
  Filled 2017-01-12: qty 1

## 2017-01-12 MED ORDER — DIPHENHYDRAMINE HCL 50 MG/ML IJ SOLN
INTRAMUSCULAR | Status: AC
  Filled 2017-01-12: qty 1

## 2017-01-12 MED ORDER — OLANZAPINE 10 MG PO TBDP
10.0000 mg | ORAL_TABLET | Freq: Three times a day (TID) | ORAL | Status: DC | PRN
  Administered 2017-01-12: 10 mg via ORAL
  Filled 2017-01-12 (×2): qty 1

## 2017-01-12 MED ORDER — STERILE WATER FOR INJECTION IJ SOLN
INTRAMUSCULAR | Status: AC
  Filled 2017-01-12: qty 10

## 2017-01-12 MED ORDER — MAGNESIUM HYDROXIDE 400 MG/5ML PO SUSP: 30.0000 mL | Freq: Every day | ORAL | Status: DC | PRN

## 2017-01-12 MED ORDER — ZIPRASIDONE MESYLATE 20 MG IM SOLR
10.0000 mg | Freq: Four times a day (QID) | INTRAMUSCULAR | Status: DC | PRN
  Administered 2017-01-12: 10 mg via INTRAMUSCULAR
  Filled 2017-01-12: qty 20

## 2017-01-12 MED ORDER — IBUPROFEN 400 MG PO TABS
600.0000 mg | ORAL_TABLET | Freq: Once | ORAL | Status: AC
  Administered 2017-01-12: 12:00:00 600 mg via ORAL
  Filled 2017-01-12: qty 1

## 2017-01-12 MED ORDER — LORAZEPAM 1 MG PO TABS
1.0000 mg | ORAL_TABLET | ORAL | Status: AC | PRN
  Administered 2017-01-13: 1 mg via ORAL
  Filled 2017-01-12: qty 1

## 2017-01-12 MED ORDER — ACETAMINOPHEN 325 MG PO TABS
650.0000 mg | ORAL_TABLET | Freq: Four times a day (QID) | ORAL | Status: DC | PRN
  Filled 2017-01-12: qty 2

## 2017-01-12 MED ORDER — LORAZEPAM 1 MG PO TABS: 1.0000 mg | ORAL_TABLET | Freq: Four times a day (QID) | ORAL | Status: DC | PRN

## 2017-01-12 MED ORDER — QUETIAPINE FUMARATE 50 MG PO TABS
50.0000 mg | ORAL_TABLET | Freq: Every evening | ORAL | Status: DC | PRN
  Administered 2017-01-12 (×2): 50 mg via ORAL
  Filled 2017-01-12 (×8): qty 1

## 2017-01-12 MED ORDER — ALUM & MAG HYDROXIDE-SIMETH 200-200-20 MG/5ML PO SUSP: 30.0000 mL | ORAL | Status: DC | PRN

## 2017-01-12 MED ORDER — CHLORPROMAZINE HCL 25 MG/ML IJ SOLN
50.0000 mg | Freq: Once | INTRAMUSCULAR | Status: AC
  Administered 2017-01-13: 50 mg via INTRAMUSCULAR
  Filled 2017-01-12: qty 2

## 2017-01-12 MED ORDER — LORAZEPAM 2 MG/ML IJ SOLN
INTRAMUSCULAR | Status: AC
  Filled 2017-01-12: qty 1

## 2017-01-12 MED ORDER — ZIPRASIDONE MESYLATE 20 MG IM SOLR
20.0000 mg | INTRAMUSCULAR | Status: DC | PRN
  Filled 2017-01-12: qty 20

## 2017-01-12 MED ORDER — CHLORPROMAZINE HCL 50 MG/2ML IJ SOLN
INTRAMUSCULAR | Status: AC
  Filled 2017-01-12: qty 2

## 2017-01-12 MED ORDER — LAMOTRIGINE 25 MG PO TABS
25.0000 mg | ORAL_TABLET | Freq: Every evening | ORAL | Status: DC
  Administered 2017-01-12: 25 mg via ORAL
  Filled 2017-01-12 (×4): qty 1

## 2017-01-12 MED ORDER — LORAZEPAM 2 MG/ML IJ SOLN
2.0000 mg | Freq: Once | INTRAMUSCULAR | Status: AC
  Administered 2017-01-13: 2 mg via INTRAMUSCULAR

## 2017-01-12 NOTE — ED Notes (Signed)
Mother here to see pt.  

## 2017-01-12 NOTE — ED Notes (Signed)
Patient refused to eat lunch.
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
## 2017-01-12 NOTE — ED Notes (Signed)
TTS at bedside now.  

## 2017-01-12 NOTE — Progress Notes (Signed)
Per Nira ConnJason Berry, NP recommend a.m. Psych evaluation Veronica Knight. Veronica Knight, LCAS-A, LPC-A, [facility]  Counselor 01/12/2017 1:51 AM
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
## 2017-01-12 NOTE — ED Notes (Signed)
Pt awake- sat up in bed- states she feels better, does not want to hurt self or anyone else. Cooperative.

## 2017-01-12 NOTE — ED Notes (Signed)
Pt moved from D36 to F9 on stretcher- pt is noncommunicative.

## 2017-01-12 NOTE — ED Notes (Signed)
Regular Diet has been ordered for Dinner. 

## 2017-01-12 NOTE — ED Notes (Signed)
Pt is asleep at present.
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
## 2017-01-12 NOTE — ED Notes (Signed)
Pt requesting belongings stating that she wants to leaving, cursing at staff and asking why she is here. Pt states "your the fuc*king crazy one bi*ch not me" Pt states that she wants to call her mother, explained to pt that it she was aloud two phone calls a day and it is very early. Pt began yelling to get out of her room.

## 2017-01-12 NOTE — Progress Notes (Signed)
[facility] scheduled to arrive after 20:00. Dr. Elna BreslowEappen accepting.  Call report to [phone].  [facility] ED Nurse notified   Timmothy EulerJean T. Kaylyn LimSutter, MSW, LCSWA Clinical Social Work Disposition [phone]

## 2017-01-12 NOTE — ED Provider Notes (Signed)
[facility] been [facility] by Dr. Elna BreslowEappen.   Dione BoozeGlick, Sidda Humm, MD 01/12/17 [id number]

## 2017-01-12 NOTE — ED Notes (Signed)
Patient refused VS to be taken.

## 2017-01-12 NOTE — ED Notes (Addendum)
Patient was offered hygiene items to freshen up, but stated she was fine, and can't stand up, But got up and walk to bathroom a few min. Later.
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
## 2017-01-12 NOTE — ED Notes (Signed)
A Regular Diet was Ordered for Lunch.

## 2017-01-12 NOTE — ED Notes (Signed)
Pt up to bathroom, very agitated, cussing at sitter, this RN asked pt not to talk to staff in that manner, pt started mocking this RN and stated "fu*k all yall"

## 2017-01-12 NOTE — ED Notes (Signed)
[facility]. Reports they will begin reassessing after 0845.

## 2017-01-12 NOTE — ED Notes (Signed)
Pt asking what she is waiting for -- explained IVC, pt became agitated - threw water across room -- removed overbed table -- pt requesting food-- salad that she did not eat at lunch. Pt is not able to make any more phone calls due to behavior.

## 2017-01-12 NOTE — ED Notes (Addendum)
Pt awake, angry and demanding to see a doctor, pt states that she has not seen a doctor since she has been here. Pt demanding to have her phone and wallet back so she can pay her phone bill. States that she wants to leave. Security at bedside, [facility], pt refused vitals

## 2017-01-12 NOTE — Progress Notes (Signed)
Per Marquette OldLaurie P., NP pt is recommended for [facility]pt treatment. [facility]. Clydie BraunKaren, RN and EDP aware of disposition. CSW to follow up once bed assignment has been made.  Princess BruinsAquicha Riko Lumsden, MSW, LCSWA CSW Disposition [phone]

## 2017-01-13 ENCOUNTER — Encounter (HOSPITAL_COMMUNITY): Payer: Self-pay | Admitting: *Deleted

## 2017-01-13 ENCOUNTER — Other Ambulatory Visit: Payer: Self-pay

## 2017-01-13 ENCOUNTER — Inpatient Hospital Stay (HOSPITAL_COMMUNITY)
Admission: AD | Admit: 2017-01-13 | Discharge: 2017-01-18 | DRG: 309 | Disposition: A | Discharge status: Home / Self Care | Payer: Self-pay | Source: Other Acute Inpatient Hospital | Attending: Internal Medicine | Admitting: Internal Medicine

## 2017-01-13 DIAGNOSIS — F39 Unspecified mood [affective] disorder: Secondary | ICD-10-CM

## 2017-01-13 DIAGNOSIS — T796XXA Traumatic ischemia of muscle, initial encounter: Secondary | ICD-10-CM

## 2017-01-13 DIAGNOSIS — F3163 Bipolar disorder, current episode mixed, severe, without psychotic features: Secondary | ICD-10-CM | POA: Diagnosis present

## 2017-01-13 DIAGNOSIS — R000 Tachycardia, unspecified: Principal | ICD-10-CM | POA: Diagnosis present

## 2017-01-13 DIAGNOSIS — F17200 Nicotine dependence, unspecified, uncomplicated: Secondary | ICD-10-CM | POA: Diagnosis present

## 2017-01-13 DIAGNOSIS — F430 Acute stress reaction: Secondary | ICD-10-CM | POA: Diagnosis present

## 2017-01-13 DIAGNOSIS — I712 Thoracic aortic aneurysm, without rupture: Secondary | ICD-10-CM | POA: Diagnosis present

## 2017-01-13 DIAGNOSIS — M6282 Rhabdomyolysis: Secondary | ICD-10-CM | POA: Diagnosis present

## 2017-01-13 DIAGNOSIS — I77810 Thoracic aortic ectasia: Secondary | ICD-10-CM

## 2017-01-13 DIAGNOSIS — E876 Hypokalemia: Secondary | ICD-10-CM | POA: Diagnosis present

## 2017-01-13 DIAGNOSIS — E860 Dehydration: Secondary | ICD-10-CM | POA: Diagnosis present

## 2017-01-13 LAB — URINALYSIS, ROUTINE W REFLEX MICROSCOPIC
Bilirubin Urine: NEGATIVE
Glucose, UA: NEGATIVE mg/dL
Hgb urine dipstick: NEGATIVE
Ketones, ur: 20 mg/dL — AB
LEUKOCYTES UA: NEGATIVE
Nitrite: NEGATIVE
Protein, ur: NEGATIVE mg/dL
Specific Gravity, Urine: 1.004 — ABNORMAL LOW (ref 1.005–1.030)
pH: 6 (ref 5.0–8.0)

## 2017-01-13 LAB — COMPREHENSIVE METABOLIC PANEL
ALK PHOS: 46 U/L (ref 38–126)
ALT: 22 U/L (ref 14–54)
AST: 43 U/L — ABNORMAL HIGH (ref 15–41)
Albumin: 4.3 g/dL (ref 3.5–5.0)
Anion gap: 10 (ref 5–15)
BUN: 7 mg/dL (ref 6–20)
CALCIUM: 9.3 mg/dL (ref 8.9–10.3)
CO2: 24 mmol/L (ref 22–32)
CREATININE: 0.71 mg/dL (ref 0.44–1.00)
Chloride: 102 mmol/L (ref 101–111)
Glucose, Bld: 149 mg/dL — ABNORMAL HIGH (ref 65–99)
Potassium: 2.9 mmol/L — ABNORMAL LOW (ref 3.5–5.1)
Sodium: 136 mmol/L (ref 135–145)
TOTAL PROTEIN: 8 g/dL (ref 6.5–8.1)
Total Bilirubin: 1.3 mg/dL — ABNORMAL HIGH (ref 0.3–1.2)

## 2017-01-13 LAB — MAGNESIUM: Magnesium: 1.8 mg/dL (ref 1.7–2.4)

## 2017-01-13 LAB — CBC
HCT: 43 % (ref 36.0–46.0)
Hemoglobin: 15.5 g/dL — ABNORMAL HIGH (ref 12.0–15.0)
MCH: 30.9 pg (ref 26.0–34.0)
MCHC: 36 g/dL (ref 30.0–36.0)
MCV: 85.7 fL (ref 78.0–100.0)
PLATELETS: 226 10*3/uL (ref 150–400)
RBC: 5.02 MIL/uL (ref 3.87–5.11)
RDW: 13.7 % (ref 11.5–15.5)
WBC: 7.9 10*3/uL (ref 4.0–10.5)

## 2017-01-13 LAB — I-STAT CG4 LACTIC ACID, ED: LACTIC ACID, VENOUS: 0.93 mmol/L (ref 0.5–1.9)

## 2017-01-13 LAB — TSH: TSH: 0.965 u[IU]/mL (ref 0.350–4.500)

## 2017-01-13 LAB — CK: CK TOTAL: 1267 U/L — AB (ref 38–234)

## 2017-01-13 LAB — POC URINE PREG, ED: PREG TEST UR: NEGATIVE

## 2017-01-13 MED ORDER — MAGNESIUM SULFATE 2 GM/50ML IV SOLN
2.0000 g | Freq: Once | INTRAVENOUS | Status: AC
  Administered 2017-01-13: 2 g via INTRAVENOUS

## 2017-01-13 MED ORDER — QUETIAPINE FUMARATE 25 MG PO TABS
50.0000 mg | ORAL_TABLET | Freq: Every day | ORAL | Status: DC
  Administered 2017-01-14: 50 mg via ORAL
  Filled 2017-01-13 (×2): qty 2

## 2017-01-13 MED ORDER — MAGNESIUM SULFATE 2 GM/50ML IV SOLN
2.0000 g | Freq: Once | INTRAVENOUS | Status: DC
  Filled 2017-01-13: qty 50

## 2017-01-13 MED ORDER — LORAZEPAM 2 MG/ML IJ SOLN
1.0000 mg | INTRAMUSCULAR | Status: DC | PRN
  Administered 2017-01-14: 1 mg via INTRAVENOUS
  Filled 2017-01-13 (×2): qty 1

## 2017-01-13 MED ORDER — OLANZAPINE 5 MG PO TBDP
5.0000 mg | ORAL_TABLET | Freq: Two times a day (BID) | ORAL | Status: DC
  Administered 2017-01-13: 5 mg via ORAL
  Filled 2017-01-13 (×4): qty 1

## 2017-01-13 MED ORDER — ENOXAPARIN SODIUM 40 MG/0.4ML ~~LOC~~ SOLN: 40.0000 mg | SUBCUTANEOUS | Status: DC

## 2017-01-13 MED ORDER — POTASSIUM CHLORIDE 10 MEQ/100ML IV SOLN
10.0000 meq | INTRAVENOUS | Status: DC
  Administered 2017-01-13: 10 meq via INTRAVENOUS
  Filled 2017-01-13 (×2): qty 100

## 2017-01-13 MED ORDER — LAMOTRIGINE 25 MG PO TABS
25.0000 mg | ORAL_TABLET | Freq: Every day | ORAL | Status: DC
  Administered 2017-01-14: 25 mg via ORAL
  Filled 2017-01-13: qty 1

## 2017-01-13 MED ORDER — SODIUM CHLORIDE 0.9 % IV BOLUS (SEPSIS)
1000.0000 mL | Freq: Once | INTRAVENOUS | Status: AC
  Administered 2017-01-13: 1000 mL via INTRAVENOUS

## 2017-01-13 MED ORDER — IBUPROFEN 600 MG PO TABS
ORAL_TABLET | ORAL | Status: AC
  Filled 2017-01-13: qty 1

## 2017-01-13 MED ORDER — POTASSIUM CHLORIDE IN NACL 40-0.9 MEQ/L-% IV SOLN
INTRAVENOUS | Status: DC
  Filled 2017-01-13: qty 1000

## 2017-01-13 MED ORDER — KETOROLAC TROMETHAMINE 30 MG/ML IJ SOLN
30.0000 mg | Freq: Once | INTRAMUSCULAR | Status: AC
Start: 2017-01-13 — End: 2017-01-13
  Administered 2017-01-13: 30 mg via INTRAVENOUS
  Filled 2017-01-13: qty 1

## 2017-01-13 MED ORDER — IBUPROFEN 200 MG PO TABS: 600.0000 mg | ORAL_TABLET | Freq: Four times a day (QID) | ORAL | Status: DC | PRN

## 2017-01-13 MED ORDER — POTASSIUM CHLORIDE IN NACL 20-0.9 MEQ/L-% IV SOLN
INTRAVENOUS | Status: DC
  Administered 2017-01-13 – 2017-01-14 (×2): via INTRAVENOUS
  Filled 2017-01-13 (×3): qty 1000

## 2017-01-13 MED ORDER — METOPROLOL TARTRATE 5 MG/5ML IV SOLN: 5.0000 mg | INTRAVENOUS | Status: DC | PRN

## 2017-01-13 MED ORDER — MAGNESIUM HYDROXIDE 400 MG/5ML PO SUSP
30.0000 mL | Freq: Every day | ORAL | Status: DC | PRN
  Administered 2017-01-14: 30 mL via ORAL
  Filled 2017-01-13: qty 30

## 2017-01-13 NOTE — ED Notes (Signed)
Patient taken to the floor

## 2017-01-13 NOTE — Progress Notes (Signed)
Nursing Progress Note: 7p-7a D: Pt currently presents with a manic/agitated/bizarre affect and behavior. Pt states "I don't want pills... I want shots... I want pills... I want shots... I don't want any kind of medication. [facility] How do you not know where they are? I need to see my patient belonging list so I can add things to it (on seven different accounts). I need to get my wallet out of the locker and my phone. I will sit up here all night and stare at you all night until you get these things for me. I don't need to sleep" Pt eventually took PO meds and rushed to the bathroom. Pt was heard by staff gagging, vomiting, and flushing the commode.  A: Pt provided with medications per providers orders. Pt's labs and vitals were monitored throughout the night. Pt supported emotionally and encouraged to express concerns and questions. Pt educated on medications.  R: Pt's safety ensured with 15 minute and environmental checks. Pt currently denies SI/HI/Self Harm and AVH. Pt verbally contracts to seek staff if SI/HI or A/VH occurs and to consult with staff before acting on any harmful thoughts. Will continue to monitor.
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
## 2017-01-13 NOTE — Progress Notes (Addendum)
1:1 progress note  Patient woke up and moved from open seclusion room to her bed.  C/O headache, offered tylenol but patient refused.  Stated she normally takes aleve.   Patient argumentative about being here, loud, demanding to leave.  RN spoke with patient, de-escalated, patient eventually said "well if you're not letting me go I'll just get depressed. This place is going to make me worse."  Patient refusing to eat and drink.  Remains safe but labile.  Irritable and refusing vital signs until 1400 (see next 1:1 note).  With 1:1 staff.

## 2017-01-13 NOTE — [facility] Group Notes (Signed)
[facility]  01/13/2017 , 1:37 PM    Type of Therapy:  [facility] [employer] Presentation  Participation Level:  Active  Participation Quality:  Attentive  Affect:  Blunted  Cognitive:  Oriented  Insight:  Limited  Engagement in Therapy:  Engaged  Modes of Intervention:  Discussion, Education and Socialization  Summary of Progress/Problems:  Veronica Knight from [employer] came to present his recovery story and play the guitar.  Invited.  In bed asleep.  Opened eyes briefly, then closed them.  Veronica Knight, Veronica Knight B 01/13/2017 , 1:37 PM
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
## 2017-01-13 NOTE — Tx Team (Signed)
Initial Treatment Plan 01/13/2017 12:26 AM Veronica Harderlivia Fox Knight [id number]    PATIENT STRESSORS: Traumatic event Other: Manic   PATIENT STRENGTHS: Average or above average intelligence Communication skills Motivation for treatment/growth Physical Health Supportive family/friends   PATIENT IDENTIFIED PROBLEMS: Psychosis (Manic Behavior)  Other's Directed Violence  "Get the hell out of here"  "Make sure I don't lose my Goddamn mind while I'm here"               DISCHARGE CRITERIA:  Ability to meet basic life and health needs Improved stabilization in mood, thinking, and/or behavior Motivation to continue treatment in a less acute level of care Need for constant or close observation no longer present Verbal commitment to aftercare and medication compliance  PRELIMINARY DISCHARGE PLAN: Outpatient therapy Return to previous living arrangement Return to previous work or school arrangements  PATIENT/FAMILY INVOLVEMENT: This treatment plan has been presented to and reviewed with the patient, Veronica Knight.  The patient and family have been given the opportunity to ask questions and make suggestions.  Carleene OverlieMiddleton, Cherise Fedder P, RN 01/13/2017, 12:26 AM

## 2017-01-13 NOTE — [facility] Counselor (Signed)
Unable to interview patient for PSA.  In bed asleep.

## 2017-01-13 NOTE — ED Triage Notes (Signed)
Per EMS[facility]elevated HR-positive for orthostatics-sent here for further work up

## 2017-01-13 NOTE — Progress Notes (Signed)
Due to system downtime, Q15 assessments were completed on paper after reviewing protocol. Please see patient physical/paper chart under the "progress notes" tab.
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
## 2017-01-13 NOTE — Progress Notes (Signed)
1:1 progress note  Patient remains asleep, responds to voice but falls back asleep shortly after.  Even non-labored respirations.

## 2017-01-13 NOTE — Progress Notes (Signed)
1:1 Progress Note D: Pt currently sleeping in seclusion.  Pt in no current distress.  A: Sitter is currently outside seclusion room door. R: Pt remains safe on a 1:1 per MD orders. Q15 assessment/checks done on paper. See paper chart.

## 2017-01-13 NOTE — ED Notes (Signed)
Bed: WA17 Expected date:  Expected time:  Means of arrival:  Comments: [facility] tachycardic, AMS
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
## 2017-01-13 NOTE — Progress Notes (Signed)
Adult Psychoeducational Group Note  Date:  01/13/2017 Time:  10:36 AM  Group Topic/Focus:  Making Healthy Choices:   The focus of this group is to help patients identify negative/unhealthy choices they were using prior to admission and identify positive/healthier coping strategies to replace them upon discharge.  Participation Level:  Did Not Attend   [name] 01/13/2017, 10:36 AM

## 2017-01-14 ENCOUNTER — Inpatient Hospital Stay (HOSPITAL_COMMUNITY): Payer: Self-pay

## 2017-01-14 ENCOUNTER — Encounter (HOSPITAL_COMMUNITY): Payer: Self-pay

## 2017-01-14 DIAGNOSIS — F4311 Post-traumatic stress disorder, acute: Secondary | ICD-10-CM

## 2017-01-14 DIAGNOSIS — F1210 Cannabis abuse, uncomplicated: Secondary | ICD-10-CM

## 2017-01-14 DIAGNOSIS — R9431 Abnormal electrocardiogram [ECG] [EKG]: Secondary | ICD-10-CM

## 2017-01-14 DIAGNOSIS — F3163 Bipolar disorder, current episode mixed, severe, without psychotic features: Secondary | ICD-10-CM

## 2017-01-14 LAB — LIPID PANEL
Cholesterol: 115 mg/dL (ref 0–200)
HDL: 23 mg/dL — ABNORMAL LOW (ref >40)
LDL CALC: 85 mg/dL (ref 0–99)
Total CHOL/HDL Ratio: 5 RATIO
Triglycerides: 34 mg/dL (ref <150)
VLDL: 7 mg/dL (ref 0–40)

## 2017-01-14 LAB — CBC WITH DIFFERENTIAL/PLATELET
Basophils Absolute: 0 10*3/uL (ref 0.0–0.1)
Basophils Relative: 0 %
Eosinophils Absolute: 0.1 10*3/uL (ref 0.0–0.7)
Eosinophils Relative: 1 %
HCT: 39.9 % (ref 36.0–46.0)
Hemoglobin: 13.9 g/dL (ref 12.0–15.0)
Lymphocytes Relative: 33 %
Lymphs Abs: 2.4 10*3/uL (ref 0.7–4.0)
MCH: 30.6 pg (ref 26.0–34.0)
MCHC: 34.8 g/dL (ref 30.0–36.0)
MCV: 87.9 fL (ref 78.0–100.0)
Monocytes Absolute: 0.7 10*3/uL (ref 0.1–1.0)
Monocytes Relative: 10 %
Neutro Abs: 3.9 10*3/uL (ref 1.7–7.7)
Neutrophils Relative %: 56 %
Platelets: 196 10*3/uL (ref 150–400)
RBC: 4.54 MIL/uL (ref 3.87–5.11)
RDW: 13.9 % (ref 11.5–15.5)
WBC: 7.1 10*3/uL (ref 4.0–10.5)

## 2017-01-14 LAB — COMPREHENSIVE METABOLIC PANEL WITH GFR
ALT: 18 U/L (ref 14–54)
AST: 31 U/L (ref 15–41)
Albumin: 3.4 g/dL — ABNORMAL LOW (ref 3.5–5.0)
Alkaline Phosphatase: 38 U/L (ref 38–126)
Anion gap: 10 (ref 5–15)
BUN: 5 mg/dL — ABNORMAL LOW (ref 6–20)
CO2: 18 mmol/L — ABNORMAL LOW (ref 22–32)
Calcium: 8 mg/dL — ABNORMAL LOW (ref 8.9–10.3)
Chloride: 108 mmol/L (ref 101–111)
Creatinine, Ser: 0.67 mg/dL (ref 0.44–1.00)
GFR calc Af Amer: >60 mL/min
GFR calc non Af Amer: >60 mL/min
Glucose, Bld: 82 mg/dL (ref 65–99)
Potassium: 3.7 mmol/L (ref 3.5–5.1)
Sodium: 136 mmol/L (ref 135–145)
Total Bilirubin: 1.3 mg/dL — ABNORMAL HIGH (ref 0.3–1.2)
Total Protein: 6.1 g/dL — ABNORMAL LOW (ref 6.5–8.1)

## 2017-01-14 LAB — T4: T4 TOTAL: 8.7 ug/dL (ref 4.5–12.0)

## 2017-01-14 LAB — CK: Total CK: 977 U/L — ABNORMAL HIGH (ref 38–234)

## 2017-01-14 LAB — ECHOCARDIOGRAM COMPLETE
HEIGHTINCHES: 65 in
Weight: 2112 oz

## 2017-01-14 LAB — HIV ANTIBODY (ROUTINE TESTING W REFLEX): HIV Screen 4th Generation wRfx: NONREACTIVE

## 2017-01-14 LAB — VITAMIN B12: Vitamin B-12: 1336 pg/mL — ABNORMAL HIGH (ref 180–914)

## 2017-01-14 MED ORDER — ENSURE ENLIVE PO LIQD: 237.0000 mL | Freq: Two times a day (BID) | ORAL | Status: DC

## 2017-01-14 MED ORDER — IOPAMIDOL (ISOVUE-370) INJECTION 76%
INTRAVENOUS | Status: AC
  Administered 2017-01-14: 100 mL
  Filled 2017-01-14: qty 100

## 2017-01-14 MED ORDER — HALOPERIDOL LACTATE 5 MG/ML IJ SOLN: 1.0000 mg | Freq: Four times a day (QID) | INTRAMUSCULAR | Status: DC | PRN

## 2017-01-14 MED ORDER — HYDRALAZINE HCL 20 MG/ML IJ SOLN: 5.0000 mg | INTRAMUSCULAR | Status: DC | PRN

## 2017-01-14 MED ORDER — SODIUM CHLORIDE 0.9 % IV SOLN
INTRAVENOUS | Status: DC
  Administered 2017-01-14: 13:00:00 via INTRAVENOUS
  Administered 2017-01-15: 100 mL via INTRAVENOUS

## 2017-01-14 MED ORDER — CYCLOBENZAPRINE HCL 5 MG PO TABS
5.0000 mg | ORAL_TABLET | Freq: Once | ORAL | Status: AC
  Administered 2017-01-14: 5 mg via ORAL
  Filled 2017-01-14: qty 1

## 2017-01-14 MED ORDER — IOPAMIDOL (ISOVUE-370) INJECTION 76%
100.0000 mL | Freq: Once | INTRAVENOUS | Status: AC | PRN
Start: 2017-01-14 — End: 2017-01-16
  Administered 2017-01-16: 100 mL via INTRAVENOUS

## 2017-01-14 MED ORDER — NAPROXEN 250 MG PO TABS
250.0000 mg | ORAL_TABLET | Freq: Once | ORAL | Status: AC
  Administered 2017-01-14: 250 mg via ORAL
  Filled 2017-01-14 (×2): qty 1

## 2017-01-14 NOTE — Progress Notes (Signed)
Patient called out of the room stating that her clothing is at home with her mother.

## 2017-01-14 NOTE — Progress Notes (Signed)
Nurse tech reported to the nurse that patient is slamming door and trying to lock the bathroom door. States "this fucking policy sucks where I have to piss with the door open."
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
## 2017-01-14 NOTE — Progress Notes (Signed)
Patient calls out about her IV leaking. RN assessed the IV site, flushed the site, did not notice any leaking. Advised patient, Rn can take the IV out but she would need another IV site. Patient stated "Hell no, you are not sticking me any more. I dont want any track marks. Just leave me alone lady, just leave me alone."  Sitter at bedside. Will continue to monitor the patient.

## 2017-01-14 NOTE — Progress Notes (Signed)
Nutrition Brief Note  Patient identified on the Malnutrition Screening Tool (MST) Report  Wt Readings from Last 15 Encounters:  01/13/17 132 lb (59.9 kg)  01/12/17 132 lb (59.9 kg)  01/11/17 140 lb (63.5 kg)    Body mass index is 21.97 kg/m. Patient meets criteria for normal weight based on current BMI.   Current diet order is heart healthy. RD met with pt in room today. Pt reluctant to speak to RD and reports that she eats fine but does not want to eat at this time. Pt appears nourished. Pt already ordered for Ensure. Pt declines any nutrition intervention at this time. Pt to transfer back to [facility] when medically appropriate.   Labs and medications reviewed.    If nutrition issues arise, please consult RD.   Koleen Distance MS, RD, LDN Pager #- [phone]

## 2017-01-14 NOTE — Progress Notes (Signed)
Patient asked sitter to tell RN for muscle relaxer. RN went in room with medication; advised patient medication will need to go through her IV and that her armband needs to be scanned; she states "Lady I dont even want the medication anymore. Just leave me alone and I just want to go home." Sitter at bedside. RN will continue to monitor the patient.

## 2017-01-14 NOTE — Progress Notes (Signed)
Rn was notified patient getting agitated while being on the phone. Writer went with 2nd RN to remove patient's phone out of the room to help get the agitation down. Patient states she would like to call her mom for her to get the phone picked up and does not want the phone at the nurses station. Patient handed the phone to the nurses and told us to get out of the room.

## 2017-01-14 NOTE — Progress Notes (Signed)
[facility] and [facility] charge [facility], no belongings were found.

## 2017-01-14 NOTE — Progress Notes (Signed)
[name] on the unit. Patient's cellphone and charger given to the mother. Mother states she is so exhausted and frustrated. She doesn't understand what is going on with her daughter. She is very concerned. [name] and writer walked in the room to let patient know she is taking her phone and charger home. She states many rude things to her mother and states to the nurse "bitch bye".
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
## 2017-01-14 NOTE — Progress Notes (Signed)
[facility]. Security officer brought the wallet, earrings and her cellphone. Patient requesting belongings to be secured on the floor and not in the security office. Patient belongings labeled and secured in the patient belonging area on the floor.

## 2017-01-14 NOTE — Progress Notes (Signed)
  Echocardiogram 2D Echocardiogram has been performed.  Arvil ChacoFoster, Jairen Goldfarb 01/14/2017, 1:59 PM

## 2017-01-14 NOTE — Progress Notes (Signed)
Patient called out of the room stating that she is sending her wallet, earrings with her mother to home.

## 2017-01-14 NOTE — Progress Notes (Signed)
Found patient's charger in the room. Advised that it needs to be kept at the nurses station; patient states "she is tired of this shit and the rules."  Patient's charger is labeled and in front her chart.

## 2017-01-14 NOTE — Progress Notes (Signed)
Informed by sitter, pt took a shower after being told she was unable to due to no order.
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
## 2017-01-14 NOTE — Progress Notes (Signed)
Patient's requesting to speak to the supervisor. AC notified.

## 2017-01-15 ENCOUNTER — Encounter (HOSPITAL_COMMUNITY): Payer: Self-pay | Admitting: General Practice

## 2017-01-15 DIAGNOSIS — I77810 Thoracic aortic ectasia: Secondary | ICD-10-CM

## 2017-01-15 DIAGNOSIS — F39 Unspecified mood [affective] disorder: Secondary | ICD-10-CM

## 2017-01-15 DIAGNOSIS — M6289 Other specified disorders of muscle: Secondary | ICD-10-CM

## 2017-01-15 LAB — AMMONIA: Ammonia: 45 umol/L — ABNORMAL HIGH (ref 9–35)

## 2017-01-15 LAB — RPR: RPR Ser Ql: NONREACTIVE

## 2017-01-15 LAB — CK: CK TOTAL: 921 U/L — AB (ref 38–234)

## 2017-01-15 LAB — URINE CULTURE: CULTURE: NO GROWTH

## 2017-01-15 LAB — HEMOGLOBIN A1C
Hgb A1c MFr Bld: 5.3 % (ref 4.8–5.6)
Mean Plasma Glucose: 105 mg/dL

## 2017-01-15 LAB — BASIC METABOLIC PANEL
ANION GAP: 6 (ref 5–15)
BUN: <5 mg/dL — ABNORMAL LOW (ref 6–20)
CALCIUM: 8.2 mg/dL — AB (ref 8.9–10.3)
CHLORIDE: 110 mmol/L (ref 101–111)
CO2: 24 mmol/L (ref 22–32)
Creatinine, Ser: 0.62 mg/dL (ref 0.44–1.00)
GFR calc non Af Amer: >60 mL/min (ref >60)
Glucose, Bld: 94 mg/dL (ref 65–99)
Potassium: 3.5 mmol/L (ref 3.5–5.1)
Sodium: 140 mmol/L (ref 135–145)

## 2017-01-15 LAB — PROLACTIN: Prolactin: 74.3 ng/mL — ABNORMAL HIGH (ref 4.8–23.3)

## 2017-01-15 MED ORDER — METOPROLOL SUCCINATE ER 25 MG PO TB24
25.0000 mg | ORAL_TABLET | Freq: Every day | ORAL | Status: DC
  Administered 2017-01-15 – 2017-01-18 (×4): 25 mg via ORAL
  Filled 2017-01-15 (×4): qty 1

## 2017-01-15 MED ORDER — ALBUTEROL SULFATE HFA 108 (90 BASE) MCG/ACT IN AERS
2.0000 | INHALATION_SPRAY | Freq: Four times a day (QID) | RESPIRATORY_TRACT | Status: DC | PRN
  Administered 2017-01-15: 2 via RESPIRATORY_TRACT
  Filled 2017-01-15: qty 6.7

## 2017-01-15 MED ORDER — LACTULOSE 10 GM/15ML PO SOLN
20.0000 g | Freq: Every day | ORAL | Status: DC
  Filled 2017-01-15: qty 30

## 2017-01-15 MED ORDER — QUETIAPINE FUMARATE 100 MG PO TABS
100.0000 mg | ORAL_TABLET | Freq: Every day | ORAL | Status: DC
  Administered 2017-01-15: 50 mg via ORAL

## 2017-01-15 MED ORDER — LORAZEPAM 2 MG/ML IJ SOLN: 1.0000 mg | Freq: Four times a day (QID) | INTRAMUSCULAR | Status: DC | PRN

## 2017-01-15 MED ORDER — ALBUTEROL SULFATE (2.5 MG/3ML) 0.083% IN NEBU
2.5000 mg | INHALATION_SOLUTION | Freq: Four times a day (QID) | RESPIRATORY_TRACT | Status: DC | PRN
  Filled 2017-01-15: qty 3

## 2017-01-15 NOTE — Progress Notes (Signed)
Patient agitated after physician assessment.  Patient requested IV discontinued and took off cardiac monitoring.  Patient continuously calling out and asking when Psychiatrist will arrive.  Patient refusing new IV site and cardiac monitoring.  Patient refused IM medication.  Physician informed.

## 2017-01-15 NOTE — Progress Notes (Signed)
Patient mother called and states patient calling her multiple times, cussing her, and sent the [employer] to mother's house.  Patient mother reports patient will not listen to her.

## 2017-01-15 NOTE — Progress Notes (Signed)
[facility] give one time dose Seroquel 50 mg NOW.  [facility].
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
## 2017-01-15 NOTE — ED Notes (Signed)
Hand cuffs removed by [employer]. No injury from hand cuffs. Radial pulses +3 equal bilaterally. Pt not cooperative for any other assessment.
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
## 2017-01-15 NOTE — Progress Notes (Signed)
Patient c/o pain around IV site. IV removed per pt request. Pt states to this RN that she does not want IV replaced.

## 2017-01-15 NOTE — Consult Note (Signed)
CARDIOLOGY CONSULT   Spoke to Dr. Sunnie Nielsenegalado this AM. Also spoke to Dr. Tyrone SageGerhardt. 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   CT result: IMPRESSION: 1. 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 Differential diagnosis for dilation of the aortic root might include Marfan syndrome, bicuspid aortic valve, or familial/congenital aortic aneurysm. Did the earlier echocardiogram demonstrate a bicuspid aortic valve? 2. No evidence of aneurysm or atherosclerosis involving the thoracic or upper abdominal aorta elsewhere. 3.  No acute cardiopulmonary disease.  ECHOCARDIOGRAM 01/14/17: Study Conclusions  - Left ventricle: The cavity size was normal. Wall thickness was   normal. Systolic function was normal. The estimated ejection   fraction was in the range of 55% to 60%. Wall motion was normal;   there were no regional wall motion abnormalities. Doppler   parameters are consistent with abnormal left ventricular   relaxation (grade 1 diastolic dysfunction). - Aortic root: The aortic root was severely dilated.  Impressions:  - Normal LV systolic function; mild diastolic dysfunction; severely   dilated aortic root (50 mm); suggest CTA or MRA to further   assess; trileaflet aortic valve with no AI. Findings discussed   with Dr Sunnie Nielsenegalado.   Dr. Tyrone SageGerhardt has recommended Aortic CTA to be certain aortic dissection has been clearly ruled out.  Assuming no dissection, she will need longitudinal f/u with Cardiac surgery team and cardiology.  Start beta blocker therapy --> initially metoprolol succinate 25 mg daily.

## 2017-01-15 NOTE — Progress Notes (Signed)
Patient was ordered a house meal tray.  Patient refused to accept tray or eat food when delivered.

## 2017-01-15 NOTE — Progress Notes (Signed)
RT called to give PRN breathing tx to patient. Patient refusing to take nebulizer tx and requesting to take inhaler. Patient very rude and demanding. This RT called pharmacy and talked to a pharmacist Loralee Pacas(Erin Williamson). Inhaler order placed and will be filled. RN aware.
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
## 2017-01-15 NOTE — Progress Notes (Signed)
Physician from psychiatry talking with patient.
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
## 2017-01-15 NOTE — Progress Notes (Signed)
Pt removed telemetry monitor. Pt appears agitated. States "I am sick of you all, and I know you are sick of me".

## 2017-01-15 NOTE — Progress Notes (Signed)
Pt. Came out to the front desk and told the secretary that she needed to see the oncoming nurse. The secretary explained to her that her nurse is still here and has not given report yet. The pt. Was very rude and stated she did not want her current nurse to come to her room. I went to her room and the patient was very rude wanting to know what the plan was for her. I told her I would be giving her her meds around 10 she asked what meds and I told her what was ordered and she said she was not going to take them. Previous nurse came in to let her know that she was going to give me report and she yelled at her and told her "didn't I tell you not to come back in my room" She is cursing at us to the point where her mother told her she was fed up with how she is talking to the staff and that she was going to leave if she didn't behave. She continued being rude to her mother and cursing trying to get her to stay.
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
## 2017-01-15 NOTE — Progress Notes (Signed)
Patient asked to speak to psychiatric physician.  Physician at bedside, pt mother at bedside.  Pt reports she will take medication, Seroquel.  Pt reports she started her cycle today.  Nurse reported she would bring pt pads and disposable panties.  Pt angrily responded she did not want those disposable panties.  Pt then called Nurse and reported she wanted supplies.  Pads and panties provided to patient.  Patient refused medication, stating she would not take it until she eats food which her mother is bringing her.

## 2017-01-16 ENCOUNTER — Ambulatory Visit (HOSPITAL_COMMUNITY)
Admission: RE | Admit: 2017-01-16 | Discharge: 2017-01-16 | Disposition: A | Discharge status: Home / Self Care | Payer: Medicaid Other | Source: Ambulatory Visit | Attending: Internal Medicine | Admitting: Internal Medicine

## 2017-01-16 ENCOUNTER — Inpatient Hospital Stay (HOSPITAL_COMMUNITY): Payer: Self-pay

## 2017-01-16 ENCOUNTER — Encounter (HOSPITAL_COMMUNITY): Payer: Self-pay | Admitting: Radiology

## 2017-01-16 DIAGNOSIS — F39 Unspecified mood [affective] disorder: Secondary | ICD-10-CM

## 2017-01-16 LAB — CK: Total CK: 958 U/L — ABNORMAL HIGH (ref 38–234)

## 2017-01-16 MED ORDER — METOPROLOL TARTRATE 25 MG PO TABS
25.0000 mg | ORAL_TABLET | Freq: Once | ORAL | Status: AC
  Administered 2017-01-16: 25 mg via ORAL
  Filled 2017-01-16: qty 1

## 2017-01-16 MED ORDER — QUETIAPINE FUMARATE 25 MG PO TABS: 50.0000 mg | ORAL_TABLET | Freq: Two times a day (BID) | ORAL | Status: DC | PRN

## 2017-01-16 MED ORDER — DIVALPROEX SODIUM 250 MG PO DR TAB
500.0000 mg | DELAYED_RELEASE_TABLET | Freq: Two times a day (BID) | ORAL | Status: DC
  Administered 2017-01-16: 500 mg via ORAL
  Filled 2017-01-16 (×2): qty 2

## 2017-01-16 MED ORDER — OLANZAPINE 2.5 MG PO TABS
2.5000 mg | ORAL_TABLET | Freq: Two times a day (BID) | ORAL | Status: DC | PRN
  Filled 2017-01-16: qty 1

## 2017-01-16 MED ORDER — IOPAMIDOL (ISOVUE-370) INJECTION 76%
INTRAVENOUS | Status: AC
  Filled 2017-01-16: qty 100

## 2017-01-16 MED ORDER — OLANZAPINE 2.5 MG PO TABS
2.5000 mg | ORAL_TABLET | Freq: Two times a day (BID) | ORAL | Status: DC
  Administered 2017-01-16: 2.5 mg via ORAL
  Filled 2017-01-16 (×5): qty 1

## 2017-01-16 NOTE — Progress Notes (Signed)
Pt leaving at this time with [facility], [facility]. Sitter/Tech went with [facility] and patient.  Pt alert and in agreement to [facility].
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
## 2017-01-16 NOTE — Progress Notes (Addendum)
This note was from the 26th @ 2005 not the 24th. The patient is very belligerent, cursing and yelling. She does not want any of us in her room and states that her problem is with all of us on the floor. Unable to assess the patient @ this time.

## 2017-01-16 NOTE — Progress Notes (Signed)
See previous note from Dr Katrinka BlazingSmith; cardiac gated CTA has been ordered; if no dissection, she should FU with Dr Tyrone SageGerhardt and Dr Katrinka BlazingSmith following DC; note echo demonstrated trileaflet aortic valve; continue beta blocker. Veronica MillersBrian Porshia Blizzard, MD

## 2017-01-16 NOTE — Progress Notes (Signed)
The patient is very belligerent, cursing and yelling. She does not want any of us in her room and states that her problem is with all of us on the floor. Unable to assess the patient @ this time.

## 2017-01-16 NOTE — Progress Notes (Signed)
Pt has returned with [facility]. VS WNL. Pt alert and noncompliant. Has removed IV access and fluids. Pt refusing to take medications.

## 2017-01-16 NOTE — Progress Notes (Signed)
At shift change, entered patient room to introduce myself and report my being her nurse today.  Patient rolled her eyes.  Patient did not verbally report she did not want my nursing care today at that time.  Patient asking what time CT will be done.  I reported I would call CT to find out.  After leaving the patient room, patient came to front desk and reported to secretary that she did not want my nursing care today nor did she want the nurse tech caring for her today.  Database administratorHouse Supervisor and charge nurse informed and aware.

## 2017-01-17 MED ORDER — LORAZEPAM 1 MG PO TABS
1.0000 mg | ORAL_TABLET | Freq: Once | ORAL | Status: AC
  Administered 2017-01-17: 1 mg via ORAL
  Filled 2017-01-17: qty 1

## 2017-01-17 NOTE — Progress Notes (Signed)
Page: Veronica Knight [phone] [name] is requesting something to help her sleep. She has no IV access.

## 2017-01-17 NOTE — Progress Notes (Signed)
Date:  Jan 17, 2017  Chart reviewed for concurrent status and case management needs.  Will continue to follow patient progress.  Discharge Planning: following for needs  Expected discharge date: [id number]  Rhonda Davis, BSN, RN3, CCM   [phone]  
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
## 2017-01-17 NOTE — Progress Notes (Signed)
[facility] she has occurred. AC has been notified and pt is aware that [facility].

## 2017-01-17 NOTE — Progress Notes (Signed)
Pt has requested to speak with MD about concerns regarding stay and discharge. Pt has also stated she was supposed to see a Psychiatrist today and would like to speak with them. Explained to the pt that psych may not be able to see her today and the MD has been paged to come speak with her about her concerns.

## 2017-01-17 NOTE — Progress Notes (Addendum)
Patient IVC expires 5/28 at 3:30pm. CSW assisted physician with completing new IVC paperwork. Magistrate called.  Information faxed. Unable to reach Magistrate for patient to be served.  12:48pm Magistrate confirmed IVC received. Patient to be served.   Vivi BarrackNicole Silvia Hightower, Theresia MajorsLCSWA, MSW Clinical Social Worker 5E and Psychiatric Service Line [phone] 01/17/2017  12:04 PM

## 2017-01-18 ENCOUNTER — Encounter (HOSPITAL_COMMUNITY): Payer: Self-pay

## 2017-01-18 ENCOUNTER — Encounter (HOSPITAL_COMMUNITY): Payer: Self-pay | Admitting: Emergency Medicine

## 2017-01-18 DIAGNOSIS — F17210 Nicotine dependence, cigarettes, uncomplicated: Secondary | ICD-10-CM

## 2017-01-18 DIAGNOSIS — E876 Hypokalemia: Secondary | ICD-10-CM

## 2017-01-18 DIAGNOSIS — M6282 Rhabdomyolysis: Secondary | ICD-10-CM

## 2017-01-18 DIAGNOSIS — F1290 Cannabis use, unspecified, uncomplicated: Secondary | ICD-10-CM

## 2017-01-18 DIAGNOSIS — R000 Tachycardia, unspecified: Principal | ICD-10-CM

## 2017-01-18 DIAGNOSIS — F322 Major depressive disorder, single episode, severe without psychotic features: Secondary | ICD-10-CM

## 2017-01-18 LAB — BASIC METABOLIC PANEL
ANION GAP: 9 (ref 5–15)
BUN: 7 mg/dL (ref 6–20)
CO2: 26 mmol/L (ref 22–32)
Calcium: 9.1 mg/dL (ref 8.9–10.3)
Chloride: 103 mmol/L (ref 101–111)
Creatinine, Ser: 0.63 mg/dL (ref 0.44–1.00)
GFR calc Af Amer: >60 mL/min (ref >60)
GFR calc non Af Amer: >60 mL/min (ref >60)
GLUCOSE: 97 mg/dL (ref 65–99)
POTASSIUM: 3.5 mmol/L (ref 3.5–5.1)
Sodium: 138 mmol/L (ref 135–145)

## 2017-01-18 MED ORDER — METOPROLOL SUCCINATE ER 25 MG PO TB24: 25.0000 mg | ORAL_TABLET | Freq: Every day | ORAL | 0 refills | Status: DC

## 2017-01-18 MED ORDER — ENSURE ENLIVE PO LIQD: 237.0000 mL | Freq: Two times a day (BID) | ORAL | 12 refills | Status: DC

## 2017-01-18 MED ORDER — OLANZAPINE 2.5 MG PO TABS: 2.5000 mg | ORAL_TABLET | Freq: Two times a day (BID) | ORAL | 0 refills | Status: DC

## 2017-01-18 NOTE — Progress Notes (Signed)
CSW met with patient and her Aunt at bedside. Patient preferred for Aunt to stay during visit. CSW explain role and explain Psychiatrist recommendations for outpatient psychiatrist and therapist follow up appointments. Patient express she has a therapist set up already, but open to other options. CSW provided the patient with list of outpatient behavioral health centers. CSW explain [place] services, medications management, therapy and crisis services. Patient Aunt, educated patient about her experience with the services, as well. Patient express she was familiar with [place], " I am familiar with this area and have heard about their agency, I think this is a better fit for me." CSW inquired if patient had other questions or concerns. Patient stated "No". Patient appreciative of CSW services and follow up.   CSW faxed rescind IVC form signed by physician. No other needs identified at this time.   Kathrin Greathouse, Latanya Presser, MSW Clinical Social Worker 5E and Psychiatric Service Line [phone] 01/18/2017  1:01 PM
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
## 2017-01-18 NOTE — Discharge Instructions (Signed)
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
## 2017-01-18 NOTE — Progress Notes (Signed)
Pt states [facility] has [facility] since this AM. Night AC notified and came by to speak to the patient. Psych was unable to see the patient today, however, [facility]. On call MD notified of patients questions and concerns, reported that psych will reevaluate tomorrow.

## 2017-01-18 NOTE — Progress Notes (Signed)
Patient given discharge instructions, and verbalized an understanding of all discharge instructions.  Patient given specific information regarding aortic aneurysm, and verbalized understanding. Patient agrees with discharge plan, and is being discharged in stable medical condition.

## 2017-01-22 ENCOUNTER — Encounter (HOSPITAL_COMMUNITY): Payer: Self-pay | Admitting: Emergency Medicine

## 2017-01-22 ENCOUNTER — Emergency Department (HOSPITAL_COMMUNITY)
Admission: EM | Admit: 2017-01-22 | Discharge: 2017-01-22 | Disposition: A | Discharge status: Home / Self Care | Payer: Medicaid Other

## 2017-01-22 DIAGNOSIS — R45850 Homicidal ideations: Secondary | ICD-10-CM | POA: Insufficient documentation

## 2017-01-22 DIAGNOSIS — E876 Hypokalemia: Secondary | ICD-10-CM | POA: Insufficient documentation

## 2017-01-22 DIAGNOSIS — F319 Bipolar disorder, unspecified: Secondary | ICD-10-CM | POA: Insufficient documentation

## 2017-01-22 DIAGNOSIS — Z79899 Other long term (current) drug therapy: Secondary | ICD-10-CM | POA: Insufficient documentation

## 2017-01-22 DIAGNOSIS — R45851 Suicidal ideations: Secondary | ICD-10-CM

## 2017-01-22 DIAGNOSIS — F4324 Adjustment disorder with disturbance of conduct: Secondary | ICD-10-CM

## 2017-01-22 LAB — I-STAT BETA HCG BLOOD, ED (MC, WL, AP ONLY): I-stat hCG, quantitative: <5 m[IU]/mL (ref <5)

## 2017-01-22 LAB — RAPID URINE DRUG SCREEN, HOSP PERFORMED
AMPHETAMINES: NOT DETECTED
BENZODIAZEPINES: NOT DETECTED
Barbiturates: NOT DETECTED
Cocaine: NOT DETECTED
OPIATES: NOT DETECTED
TETRAHYDROCANNABINOL: POSITIVE — AB

## 2017-01-22 LAB — COMPREHENSIVE METABOLIC PANEL
ALT: 15 U/L (ref 14–54)
AST: 16 U/L (ref 15–41)
Albumin: 4 g/dL (ref 3.5–5.0)
Alkaline Phosphatase: 45 U/L (ref 38–126)
Anion gap: 8 (ref 5–15)
BUN: 9 mg/dL (ref 6–20)
CHLORIDE: 102 mmol/L (ref 101–111)
CO2: 27 mmol/L (ref 22–32)
Calcium: 9.2 mg/dL (ref 8.9–10.3)
Creatinine, Ser: 0.61 mg/dL (ref 0.44–1.00)
GFR calc Af Amer: >60 mL/min (ref >60)
Glucose, Bld: 95 mg/dL (ref 65–99)
POTASSIUM: 3.2 mmol/L — AB (ref 3.5–5.1)
SODIUM: 137 mmol/L (ref 135–145)
Total Bilirubin: 0.2 mg/dL — ABNORMAL LOW (ref 0.3–1.2)
Total Protein: 7.3 g/dL (ref 6.5–8.1)

## 2017-01-22 LAB — CBC
HCT: 40.8 % (ref 36.0–46.0)
HEMOGLOBIN: 14.3 g/dL (ref 12.0–15.0)
MCH: 31.1 pg (ref 26.0–34.0)
MCHC: 35 g/dL (ref 30.0–36.0)
MCV: 88.7 fL (ref 78.0–100.0)
PLATELETS: 296 10*3/uL (ref 150–400)
RBC: 4.6 MIL/uL (ref 3.87–5.11)
RDW: 13.7 % (ref 11.5–15.5)
WBC: 6.5 10*3/uL (ref 4.0–10.5)

## 2017-01-22 LAB — ETHANOL

## 2017-01-22 LAB — SALICYLATE LEVEL

## 2017-01-22 LAB — ACETAMINOPHEN LEVEL: Acetaminophen (Tylenol), Serum: <10 ug/mL — ABNORMAL LOW (ref 10–30)

## 2017-01-22 MED ORDER — ALBUTEROL SULFATE HFA 108 (90 BASE) MCG/ACT IN AERS: 1.0000 | INHALATION_SPRAY | Freq: Four times a day (QID) | RESPIRATORY_TRACT | Status: DC | PRN

## 2017-01-22 MED ORDER — NICOTINE 21 MG/24HR TD PT24: 21.0000 mg | MEDICATED_PATCH | Freq: Every day | TRANSDERMAL | Status: DC

## 2017-01-22 MED ORDER — ONDANSETRON HCL 4 MG PO TABS: 4.0000 mg | ORAL_TABLET | Freq: Three times a day (TID) | ORAL | Status: DC | PRN

## 2017-01-22 MED ORDER — IBUPROFEN 200 MG PO TABS: 600.0000 mg | ORAL_TABLET | Freq: Three times a day (TID) | ORAL | Status: DC | PRN

## 2017-01-22 MED ORDER — OLANZAPINE 2.5 MG PO TABS: 2.5000 mg | ORAL_TABLET | Freq: Two times a day (BID) | ORAL | Status: DC

## 2017-01-22 MED ORDER — POTASSIUM CHLORIDE CRYS ER 20 MEQ PO TBCR
40.0000 meq | EXTENDED_RELEASE_TABLET | Freq: Once | ORAL | Status: DC
  Filled 2017-01-22: qty 2

## 2017-01-22 MED ORDER — ACETAMINOPHEN 325 MG PO TABS: 650.0000 mg | ORAL_TABLET | ORAL | Status: DC | PRN

## 2017-01-22 MED ORDER — ZOLPIDEM TARTRATE 5 MG PO TABS: 5.0000 mg | ORAL_TABLET | Freq: Every evening | ORAL | Status: DC | PRN

## 2017-01-22 NOTE — ED Notes (Signed)
Pt displaying increasing agitation. Voicing that she wants to use the phone, wanting her sheets changed, and requesting to take a shower then refusing.

## 2017-01-22 NOTE — Progress Notes (Signed)
CSW filed patient's examination and recommendation paperwork into IVC logbook.

## 2017-01-22 NOTE — [facility] Suicide Risk Assessment (Cosign Needed Addendum)
Suicide Risk Assessment  Discharge Assessment   [facility]   Principal Problem: Adjustment disorder with disturbance of conduct Discharge Diagnoses:  Patient Active Problem List   Diagnosis Date Noted  . Adjustment disorder with disturbance of conduct [F43.24] 01/22/2017    Priority: High  . Unspecified mood (affective) disorder (HCC) [F39] 01/15/2017  . Aortic root dilation (HCC) [I77.810]   . Rhabdomyolysis [M62.82] 01/13/2017  . Hypokalemia [E87.6] 01/13/2017  . Sinus tachycardia [R00.0] 01/13/2017  . Vaginal delivery [O80] 06/08/2015  . Syncope during previous pregnancy--negative cardiac w/u [R55] 11/05/2013    Total Time spent with patient: 45 minutes  Musculoskeletal: Strength & Muscle Tone: within normal limits Gait & Station: normal Patient leans: N/A  Psychiatric Specialty Exam:   Blood pressure 121/84, pulse (!) 102, temperature 98.5 F (36.9 C), temperature source Oral, resp. rate 16, last menstrual period 01/15/2017, SpO2 99 %, unknown if currently breastfeeding.There is no height or weight on file to calculate BMI.  General Appearance: Casual  Eye Contact::  Good  Speech:  Normal Rate409  Volume:  Normal  Mood:  Irritable  Affect:  Congruent  Thought Process:  Coherent and Descriptions of Associations: Intact  Orientation:  Full (Time, Place, and Person)  Thought Content:  WDL and Logical  Suicidal Thoughts:  No  Homicidal Thoughts:  No  Memory:  Immediate;   Good Recent;   Good Remote;   Good  Judgement:  Fair  Insight:  Fair  Psychomotor Activity:  Normal  Concentration:  Good  Recall:  Good  Fund of Knowledge:Fair  Language: Good  Akathisia:  No  Handed:  Right  AIMS (if indicated):     Assets:  Leisure Time Physical Health Resilience Social Support  Sleep:     Cognition: WNL  ADL's:  Intact   Mental Status Per Nursing Assessment::   On Admission:   Patient was IVC'd by her mother for suicidal ideations.   Zollie ScaleOlivia denies suicidal ideations since admission, no past attempts or history.  Reports her mother is crazy but she is not.  Denies suicidal/homicidal ideations, hallucinations, and alcohol/drug abuse with the exception of marijuana.  Reports she does not take medications because she does not need it and does not have any psychiatric issues.  Caveat:  Police report multiple warrants for assault charges pending.  Demographic Factors:  Adolescent or young adult  Loss Factors: Legal issues  Historical Factors: NA  Risk Reduction Factors:   Sense of responsibility to family, Living with another person, especially a relative and Positive social support  Continued Clinical Symptoms:  Irritable  Cognitive Features That Contribute To Risk:  None    Suicide Risk:  Minimal: No identifiable suicidal ideation.  Patients presenting with no risk factors but with morbid ruminations; may be classified as minimal risk based on the severity of the depressive symptoms    Plan Of Care/Follow-up recommendations:  Activity:  as tolerated Diet:  heaert healthy diet  Lumen Brinlee, NP 01/22/2017, 10:05 AM

## 2017-01-22 NOTE — ED Notes (Signed)
Urine collector placed into commode to obtain urine sample.

## 2017-01-22 NOTE — ED Notes (Signed)
Patient has been sleeping with covers over her head since she has been here. She allowed NT to obtain VS but refused to take potassium pills. "lady, I'm not taking that."

## 2017-01-22 NOTE — ED Notes (Signed)
Pt requested to know why she is here. Pt states she was not aggressive w/ her family and would like to go home right now. Pt demanded to use phone. RN instructed pt that phone privileges begin at 9am.
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
## 2017-01-22 NOTE — ED Triage Notes (Signed)
Patient BIB [employer] for aggressive behavior and threatening to kill her family. Family is in process of taking out IVC papers at this time. [employer] states pt has outstanding warrants at this time as well.

## 2017-01-22 NOTE — ED Notes (Signed)
Officer Shackleford here with IVC papers.

## 2017-01-22 NOTE — ED Notes (Signed)
Pt informed by psychiatry that she will be d/c'd and IVC paper work resended. Pt initally requested to have [employer] transport her home and agreed to wait for them to transport and her discharge paperwork. Pt later changed her mind and walked toward the exit.

## 2017-01-22 NOTE — ED Notes (Signed)
Bed: WA28 Expected date:  Expected time:  Means of arrival:  Comments: [employer] voluntary 

## 2017-02-15 ENCOUNTER — Telehealth: Payer: Self-pay | Admitting: Cardiothoracic Surgery

## 2017-02-17 ENCOUNTER — Encounter: Payer: Self-pay | Admitting: Cardiothoracic Surgery

## 2017-03-01 ENCOUNTER — Encounter: Payer: Self-pay | Admitting: Cardiothoracic Surgery

## 2017-04-01 ENCOUNTER — Encounter (HOSPITAL_COMMUNITY): Payer: Self-pay | Admitting: Emergency Medicine

## 2017-04-01 DIAGNOSIS — R072 Precordial pain: Secondary | ICD-10-CM | POA: Insufficient documentation

## 2017-04-01 DIAGNOSIS — F17210 Nicotine dependence, cigarettes, uncomplicated: Secondary | ICD-10-CM | POA: Insufficient documentation

## 2017-04-01 DIAGNOSIS — Z79899 Other long term (current) drug therapy: Secondary | ICD-10-CM | POA: Insufficient documentation

## 2017-04-01 DIAGNOSIS — I77810 Thoracic aortic ectasia: Secondary | ICD-10-CM | POA: Insufficient documentation

## 2017-04-01 NOTE — ED Triage Notes (Signed)
Pt reports pain in L arm and L axilla, describes as burning sensation, nagging. Comes and goes. Tachycardic in triage.

## 2017-04-02 ENCOUNTER — Encounter (HOSPITAL_COMMUNITY): Payer: Self-pay | Admitting: Emergency Medicine

## 2017-04-02 ENCOUNTER — Emergency Department (HOSPITAL_COMMUNITY): Payer: Self-pay

## 2017-04-02 ENCOUNTER — Emergency Department (HOSPITAL_COMMUNITY)
Admission: EM | Admit: 2017-04-02 | Discharge: 2017-04-02 | Disposition: A | Discharge status: Home / Self Care | Payer: Self-pay | Attending: Emergency Medicine | Admitting: Emergency Medicine

## 2017-04-02 DIAGNOSIS — R072 Precordial pain: Secondary | ICD-10-CM

## 2017-04-02 DIAGNOSIS — I77810 Thoracic aortic ectasia: Secondary | ICD-10-CM

## 2017-04-02 HISTORY — DX: Thoracic aortic aneurysm, without rupture, unspecified: I71.20

## 2017-04-02 HISTORY — DX: Thoracic aortic aneurysm, without rupture: I71.2

## 2017-04-02 LAB — CBC
HCT: 35.2 % — ABNORMAL LOW (ref 36.0–46.0)
Hemoglobin: 12.1 g/dL (ref 12.0–15.0)
MCH: 30.9 pg (ref 26.0–34.0)
MCHC: 34.4 g/dL (ref 30.0–36.0)
MCV: 90 fL (ref 78.0–100.0)
PLATELETS: 247 10*3/uL (ref 150–400)
RBC: 3.91 MIL/uL (ref 3.87–5.11)
RDW: 16 % — AB (ref 11.5–15.5)
WBC: 6.3 10*3/uL (ref 4.0–10.5)

## 2017-04-02 LAB — BASIC METABOLIC PANEL
Anion gap: 8 (ref 5–15)
BUN: 5 mg/dL — AB (ref 6–20)
CALCIUM: 8.9 mg/dL (ref 8.9–10.3)
CHLORIDE: 101 mmol/L (ref 101–111)
CO2: 28 mmol/L (ref 22–32)
CREATININE: 0.72 mg/dL (ref 0.44–1.00)
GFR calc Af Amer: >60 mL/min (ref >60)
GFR calc non Af Amer: >60 mL/min (ref >60)
Glucose, Bld: 105 mg/dL — ABNORMAL HIGH (ref 65–99)
Potassium: 4.2 mmol/L (ref 3.5–5.1)
SODIUM: 137 mmol/L (ref 135–145)

## 2017-04-02 LAB — I-STAT TROPONIN, ED: Troponin i, poc: 0 ng/mL (ref 0.00–0.08)

## 2017-04-02 LAB — I-STAT BETA HCG BLOOD, ED (MC, WL, AP ONLY): I-stat hCG, quantitative: <5 m[IU]/mL (ref <5)

## 2017-04-02 MED ORDER — METOPROLOL SUCCINATE ER 25 MG PO TB24: 25.0000 mg | ORAL_TABLET | Freq: Every day | ORAL | 0 refills | Status: DC

## 2017-04-02 MED ORDER — IOPAMIDOL (ISOVUE-370) INJECTION 76%
INTRAVENOUS | Status: AC
  Administered 2017-04-02: 100 mL
  Filled 2017-04-02: qty 100
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
## 2017-04-02 NOTE — ED Notes (Signed)
Taken to xray at this time. 

## 2017-04-02 NOTE — Discharge Instructions (Signed)
1. Medications: resume your metoprolol - new Rx written, usual home medications 2. Treatment: rest, drink plenty of fluids,  3. Follow Up: Please followup with your primary doctor in 2-3 days for discussion of your diagnoses and further evaluation after today's visit; Please also follow-up with Dr. Tyrone SageGerhardt and Katrinka BlazingSmith; Please return to the ER for return of chest pain, syncope, worsening symptoms or other concerns

## 2017-04-04 ENCOUNTER — Emergency Department (HOSPITAL_COMMUNITY)
Admission: EM | Admit: 2017-04-04 | Discharge: 2017-04-04 | Disposition: A | Discharge status: Home / Self Care | Payer: Medicaid Other | Attending: Emergency Medicine | Admitting: Emergency Medicine

## 2017-04-04 ENCOUNTER — Encounter (HOSPITAL_COMMUNITY): Payer: Self-pay | Admitting: Emergency Medicine

## 2017-04-04 DIAGNOSIS — F17210 Nicotine dependence, cigarettes, uncomplicated: Secondary | ICD-10-CM | POA: Insufficient documentation

## 2017-04-04 DIAGNOSIS — H9202 Otalgia, left ear: Secondary | ICD-10-CM | POA: Insufficient documentation

## 2017-04-04 DIAGNOSIS — R208 Other disturbances of skin sensation: Secondary | ICD-10-CM | POA: Insufficient documentation

## 2017-04-04 MED ORDER — ALBUTEROL SULFATE HFA 108 (90 BASE) MCG/ACT IN AERS: 2.0000 | INHALATION_SPRAY | RESPIRATORY_TRACT | 0 refills | Status: DC | PRN

## 2017-04-04 MED ORDER — LIDOCAINE HCL 4 % EX SOLN
Freq: Once | CUTANEOUS | Status: AC
  Administered 2017-04-04: 1 mL via TOPICAL
  Filled 2017-04-04: qty 50

## 2017-04-04 NOTE — ED Notes (Signed)
Pt ambulatory and independent at discharge.  Verbalized understanding of discharge instructions 

## 2017-04-04 NOTE — ED Notes (Signed)
Bed: WA08 Expected date:  Expected time:  Means of arrival:  Comments: 

## 2017-04-04 NOTE — ED Triage Notes (Signed)
Pt reports having left ear pain that has been ongoing for the last few days. Pt states it feels like a burning sensation in the ear canal.

## 2017-04-04 NOTE — Discharge Instructions (Signed)
You can use lidocaine drops, 2 drops into the left ear every 6 hours for pain. Follow-up with ear nose throat as needed.
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
## 2017-04-26 ENCOUNTER — Emergency Department (HOSPITAL_COMMUNITY): Payer: Medicaid Other

## 2017-04-26 ENCOUNTER — Emergency Department (HOSPITAL_COMMUNITY)
Admission: EM | Admit: 2017-04-26 | Discharge: 2017-04-26 | Disposition: A | Discharge status: Home / Self Care | Payer: Medicaid Other | Attending: Emergency Medicine | Admitting: Emergency Medicine

## 2017-04-26 ENCOUNTER — Encounter (HOSPITAL_COMMUNITY): Payer: Self-pay | Admitting: *Deleted

## 2017-04-26 DIAGNOSIS — R072 Precordial pain: Secondary | ICD-10-CM

## 2017-04-26 DIAGNOSIS — Z8679 Personal history of other diseases of the circulatory system: Secondary | ICD-10-CM | POA: Diagnosis not present

## 2017-04-26 DIAGNOSIS — I712 Thoracic aortic aneurysm, without rupture, unspecified: Secondary | ICD-10-CM

## 2017-04-26 LAB — CBC
HEMATOCRIT: 38.3 % (ref 36.0–46.0)
Hemoglobin: 12.6 g/dL (ref 12.0–15.0)
MCH: 30 pg (ref 26.0–34.0)
MCHC: 32.9 g/dL (ref 30.0–36.0)
MCV: 91.2 fL (ref 78.0–100.0)
Platelets: 302 10*3/uL (ref 150–400)
RBC: 4.2 MIL/uL (ref 3.87–5.11)
RDW: 14.4 % (ref 11.5–15.5)
WBC: 5.6 10*3/uL (ref 4.0–10.5)

## 2017-04-26 LAB — BASIC METABOLIC PANEL
Anion gap: 7 (ref 5–15)
BUN: 12 mg/dL (ref 6–20)
CHLORIDE: 105 mmol/L (ref 101–111)
CO2: 24 mmol/L (ref 22–32)
Calcium: 9.2 mg/dL (ref 8.9–10.3)
Creatinine, Ser: 0.67 mg/dL (ref 0.44–1.00)
GFR calc Af Amer: >60 mL/min (ref >60)
GFR calc non Af Amer: >60 mL/min (ref >60)
GLUCOSE: 95 mg/dL (ref 65–99)
POTASSIUM: 3.9 mmol/L (ref 3.5–5.1)
Sodium: 136 mmol/L (ref 135–145)

## 2017-04-26 LAB — I-STAT TROPONIN, ED: Troponin i, poc: 0 ng/mL (ref 0.00–0.08)

## 2017-04-26 MED ORDER — HYDROCODONE-ACETAMINOPHEN 5-325 MG PO TABS
1.0000 | ORAL_TABLET | Freq: Once | ORAL | Status: AC
  Administered 2017-04-26: 1 via ORAL
  Filled 2017-04-26: qty 1

## 2017-04-26 MED ORDER — ACETAMINOPHEN 325 MG PO TABS
650.0000 mg | ORAL_TABLET | Freq: Once | ORAL | Status: AC
Start: 2017-04-26 — End: 2017-04-26
  Administered 2017-04-26: 650 mg via ORAL
  Filled 2017-04-26: qty 2

## 2017-04-26 MED ORDER — IOPAMIDOL (ISOVUE-370) INJECTION 76%
INTRAVENOUS | Status: AC
  Administered 2017-04-26: 100 mL
  Filled 2017-04-26: qty 100

## 2017-04-26 MED ORDER — IOPAMIDOL (ISOVUE-370) INJECTION 76%
INTRAVENOUS | Status: DC
Start: 2017-04-26 — End: 2017-04-26
  Filled 2017-04-26: qty 100

## 2017-04-26 NOTE — ED Provider Notes (Signed)
Patient care assumed from [facility], PA-C at shift change. Please see his note for further.  Plan at shift change was for patient's result of CT angiography chest with aorta to return. If this is stable plan is for discharge. Briefly, patient presented with chest pain since yesterday that is worse with movement and coughing. Initial workup by previous provider was reassuring and plan is for discharge if CT angiogram of the chest showed stable findings. She has a known aortic aneurysm and is due to follow-up with vascular surgery next week. No plan for delta trop. Initial troponin was negative.  CT angiogram of her chest with aorta shows a stable thoracic aortic aneurysm measuring 4.8 cm at the aortic root. No acute findings.  I discussed results with patient and family. I encouraged her to keep her follow-up appointment with vascular surgery for next week. Strict and specific return precautions discussed. I advised the patient to follow-up with their primary care provider this week. I advised the patient to return to the emergency department with new or worsening symptoms or new concerns. The patient verbalized understanding and agreement with plan.   Results for orders placed or performed during the hospital encounter of 04/26/17  Basic metabolic panel  Result Value Ref Range   Sodium 136 135 - 145 mmol/L   Potassium 3.9 3.5 - 5.1 mmol/L   Chloride 105 101 - 111 mmol/L   CO2 24 22 - 32 mmol/L   Glucose, Bld 95 65 - 99 mg/dL   BUN 12 6 - 20 mg/dL   Creatinine, Ser 1.61 0.44 - 1.00 mg/dL   Calcium 9.2 8.9 - 09.6 mg/dL   GFR calc non Af Amer >60 >60 mL/min   GFR calc Af Amer >60 >60 mL/min   Anion gap 7 5 - 15  CBC  Result Value Ref Range   WBC 5.6 4.0 - 10.5 K/uL   RBC 4.20 3.87 - 5.11 MIL/uL   Hemoglobin 12.6 12.0 - 15.0 g/dL   HCT 04.5 40.9 - 81.1 %   MCV 91.2 78.0 - 100.0 fL   MCH 30.0 26.0 - 34.0 pg   MCHC 32.9 30.0 - 36.0 g/dL   RDW 91.4 78.2 - 95.6 %   Platelets 302 150 - 400  K/uL  I-stat troponin, ED  Result Value Ref Range   Troponin i, poc 0.00 0.00 - 0.08 ng/mL   Comment 3            Ct Angio Chest Aorta W/cm &/or Wo/cm  Result Date: 04/26/2017 CLINICAL DATA:  Central chest pain for 2 days. Known thoracic aortic aneurysm. EXAM: CT ANGIOGRAPHY CHEST WITH CONTRAST TECHNIQUE: Multidetector CT imaging of the chest was performed using the standard protocol during bolus administration of intravenous contrast. Multiplanar CT image reconstructions and MIPs were obtained to evaluate the vascular anatomy. CONTRAST:  80 mL Isovue 370 intravenous COMPARISON:  Multiple prior scans dating back to 01/14/2017 FINDINGS: Cardiovascular: Stable aortic root dilatation up to 4.8 cm. No aortic dissection. No mediastinal hemorrhage. The aorta rapidly tapers to normal caliber through the great vessels, also unchanged. No pericardial effusion. Pulmonary vasculature is also well opacified, with no emboli. Mediastinum/Nodes: No enlarged mediastinal, hilar, or axillary lymph nodes. Thyroid gland, trachea, and esophagus demonstrate no significant findings. Lungs/Pleura: Lungs are clear. No pleural effusion or pneumothorax. Upper Abdomen: No acute findings. Musculoskeletal: No significant skeletal lesion. Review of the MIP images confirms the above findings. IMPRESSION: Stable thoracic aortic aneurysm, 4.8 cm at the aortic root. No  acute findings. Electronically Signed   By: Ellery Plunkaniel R Mitchell M.D.   On: 04/26/2017 06:52        Everlene FarrierDansie, Raymondo Garcialopez, PA-C 04/26/17 0815    Gerhard MunchLockwood, Robert, MD 04/27/17 [phone]

## 2017-04-26 NOTE — ED Triage Notes (Signed)
Pt c/o centralized chest pain for 2 days; pain increases with movement and inspiration. Dx with thoracic aneurysm in May and has a follow up appt 9/12
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
## 2017-05-04 ENCOUNTER — Encounter: Payer: Self-pay | Admitting: Physician Assistant

## 2017-05-04 ENCOUNTER — Ambulatory Visit (INDEPENDENT_AMBULATORY_CARE_PROVIDER_SITE_OTHER): Payer: Medicaid Other | Admitting: Physician Assistant

## 2017-05-04 VITALS — BP 90/60 | HR 68 | Ht 67.0 in | Wt 152.8 lb

## 2017-05-04 DIAGNOSIS — I77810 Thoracic aortic ectasia: Secondary | ICD-10-CM | POA: Diagnosis not present

## 2017-05-04 DIAGNOSIS — R0789 Other chest pain: Secondary | ICD-10-CM

## 2017-05-04 MED ORDER — METOPROLOL SUCCINATE ER 25 MG PO TB24: 25.0000 mg | ORAL_TABLET | Freq: Every day | ORAL | 3 refills | Status: DC

## 2017-05-04 NOTE — Patient Instructions (Signed)
Medication Instructions:  1. Your physician recommends that you continue on your current medications as directed. Please refer to the Current Medication list given to you today.  2. A REFILL HAS BEEN SENT IN FOR TOPROL  Labwork: NONE ORDERED TODAY  Testing/Procedures: NONE ORDERED TODAY  Follow-Up: 05/27/17 @ 8:15 WITH SCOTT WEAVER, PAC SAME DAY DR. Elease HashimotoNAHSER IS IN THE OFFICE  Any Other Special Instructions Will Be Listed Below (If Applicable).  WE WILL WAIT TO SEE WHAT DR. Tyrone SageGERHARDT SAYS ABOUT YOUR ANEURYSM   If you need a refill on your cardiac medications before your next appointment, please call your pharmacy.

## 2017-05-12 ENCOUNTER — Institutional Professional Consult (permissible substitution) (INDEPENDENT_AMBULATORY_CARE_PROVIDER_SITE_OTHER): Payer: Medicaid Other | Admitting: Cardiothoracic Surgery

## 2017-05-12 ENCOUNTER — Encounter: Payer: Self-pay | Admitting: Cardiothoracic Surgery

## 2017-05-12 VITALS — BP 115/77 | HR 72 | Resp 16 | Ht 67.0 in | Wt 153.0 lb

## 2017-05-12 DIAGNOSIS — R0789 Other chest pain: Secondary | ICD-10-CM

## 2017-05-12 DIAGNOSIS — R079 Chest pain, unspecified: Secondary | ICD-10-CM

## 2017-05-12 DIAGNOSIS — I77810 Thoracic aortic ectasia: Secondary | ICD-10-CM

## 2017-05-12 NOTE — Patient Instructions (Addendum)
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
## 2017-05-18 ENCOUNTER — Ambulatory Visit (INDEPENDENT_AMBULATORY_CARE_PROVIDER_SITE_OTHER): Payer: Medicaid Other | Admitting: Physician Assistant

## 2017-05-18 ENCOUNTER — Encounter: Payer: Self-pay | Admitting: Physician Assistant

## 2017-05-18 VITALS — BP 112/62 | HR 73 | Ht 67.0 in | Wt 152.8 lb

## 2017-05-18 DIAGNOSIS — I712 Thoracic aortic aneurysm, without rupture: Secondary | ICD-10-CM | POA: Diagnosis not present

## 2017-05-18 DIAGNOSIS — R079 Chest pain, unspecified: Secondary | ICD-10-CM

## 2017-05-18 DIAGNOSIS — I7121 Aneurysm of the ascending aorta, without rupture: Secondary | ICD-10-CM
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
## 2017-05-18 NOTE — Patient Instructions (Signed)
Medication Instructions:  Your physician recommends that you continue on your current medications as directed. Please refer to the Current Medication list given to you today.   Labwork: Today: BMET,CBC,PT/INR,RPR   Testing/Procedures: Your physician has requested that you have a cardiac catheterization. Cardiac catheterization is used to diagnose and/or treat various heart conditions. Doctors may recommend this procedure for a number of different reasons. The most common reason is to evaluate chest pain. Chest pain can be a symptom of coronary artery disease (CAD), and cardiac catheterization can show whether plaque is narrowing or blocking your heart's arteries. This procedure is also used to evaluate the valves, as well as measure the blood flow and oxygen levels in different parts of your heart. For further information please visit [email or web address] Please follow instruction sheet, as given.    Follow-Up:  June 06, 2017 @ 2:45 pm with Veronica Knight, [facility], post cath follow up  Any Other Special Instructions Will Be Listed Below (If Applicable).  If you need a refill on your cardiac medications before your next appointment, please call your pharmacy.    [facility] CARDIOVASCULAR DIVISION [facility] [address] OFFICE [address] [place] [address] Dept: [phone] Loc: [phone]  Veronica Knight  05/18/2017  You are scheduled for a Cardiac Catheterization on Tuesday, October 2 with Dr. Bryan Lemma.  1. Please arrive at the [facility] (Main Entrance A) at [facility]: [address] [place], [place] [address] at 8:00 AM (two hours before your procedure to ensure your preparation). Free valet parking service is available.   Special note: Every effort is made to have your procedure done on time. Please understand that emergencies sometimes delay scheduled procedures.  2. Diet: Do not eat or drink anything after  midnight prior to your procedure except sips of water to take medications.  3. Labs: You will need to have blood drawn on Wednesday, September 26 at [facility]. [address], [place]  Open: 7:30am - 5pm    Phone: [phone]. You do not need to be fasting.  4. Medication instructions in preparation for your procedure:  On the morning of your procedure, take your Aspirin 81 MG and any morning medicines NOT listed above.  You may use SMALL sips of water.  5. Plan for one night stay--bring personal belongings. 6. Bring a current list of your medications and current insurance cards. 7. You MUST have a responsible person to drive you home. 8. Someone MUST be with you the first 24 hours after you arrive home or your discharge will be delayed. 9. Please wear clothes that are easy to get on and off and wear slip-on shoes.  Thank you for allowing [place] to care for you!   -- [facility]

## 2017-05-20 ENCOUNTER — Telehealth: Payer: Self-pay | Admitting: *Deleted

## 2017-05-20 LAB — BASIC METABOLIC PANEL
BUN/Creatinine Ratio: 18 (ref 9–23)
BUN: 11 mg/dL (ref 6–20)
CALCIUM: 9.4 mg/dL (ref 8.7–10.2)
CHLORIDE: 104 mmol/L (ref 96–106)
CO2: 17 mmol/L — AB (ref 20–29)
Creatinine, Ser: 0.6 mg/dL (ref 0.57–1.00)
GFR calc Af Amer: 144 mL/min/{1.73_m2} (ref >59)
GFR, EST NON AFRICAN AMERICAN: 125 mL/min/{1.73_m2} (ref >59)
GLUCOSE: 86 mg/dL (ref 65–99)
POTASSIUM: 4.6 mmol/L (ref 3.5–5.2)
SODIUM: 140 mmol/L (ref 134–144)

## 2017-05-20 LAB — PROTIME-INR
INR: 1 (ref 0.8–1.2)
Prothrombin Time: 10.7 s (ref 9.1–12.0)

## 2017-05-20 LAB — CBC

## 2017-05-20 LAB — RPR: RPR: NONREACTIVE

## 2017-05-20 NOTE — Telephone Encounter (Signed)
Pt has been notified of lab results by phone. Pt made aware lab states not enough blood for the CBC sample to be run. Pt advised we will need to repeat the CBC in order for her to proceed with her cath on 05/24/17. Pt states to me" this was not her fault". I acknowledge this. I asked pt if she could come in today or Monday for the CBC to be drawn. Pt said "this was stupid" and hung up on me without being able to schedule her lab appt for the CBC.

## 2017-05-20 NOTE — Telephone Encounter (Signed)
-----   Message from Beatrice Lecher, [place] sent at 05/20/2017  7:54 AM EDT ----- Please call the patient Kidney function, INR normal.  RPR negative. CBC not done - please have patient come in one day before Cardiac Catheterization for CBC. [name], PA-C    05/20/2017 7:53 AM

## 2017-05-23 ENCOUNTER — Other Ambulatory Visit: Payer: Medicaid Other

## 2017-05-23 ENCOUNTER — Other Ambulatory Visit: Payer: Self-pay | Admitting: *Deleted

## 2017-05-23 ENCOUNTER — Telehealth: Payer: Self-pay | Admitting: *Deleted

## 2017-05-23 ENCOUNTER — Telehealth: Payer: Self-pay

## 2017-05-23 DIAGNOSIS — R000 Tachycardia, unspecified: Secondary | ICD-10-CM

## 2017-05-23 LAB — CBC
HEMATOCRIT: 40.8 % (ref 34.0–46.6)
HEMOGLOBIN: 13.8 g/dL (ref 11.1–15.9)
MCH: 30.7 pg (ref 26.6–33.0)
MCHC: 33.8 g/dL (ref 31.5–35.7)
MCV: 91 fL (ref 79–97)
Platelets: 327 10*3/uL (ref 150–379)
RBC: 4.49 x10E6/uL (ref 3.77–5.28)
RDW: 12.8 % (ref 12.3–15.4)
WBC: 3.8 10*3/uL (ref 3.4–10.8)

## 2017-05-23 NOTE — Telephone Encounter (Signed)
-----   Message from Beatrice Lecher, [place] sent at 05/23/2017  4:42 PM EDT ----- Please call the patient The hemoglobin is normal. Continue with current treatment plan. [name], PA-C   05/23/2017 4:42 PM

## 2017-05-23 NOTE — Telephone Encounter (Signed)
Patient contacted pre-catheterization at [facility] scheduled for:  05/24/2017 @ 1030 Verified arrival time and place:  NT @ 0800 Confirmed AM meds to be taken pre-cath with sip of water: Take ASA Confirmed patient has responsible person to drive home post procedure and observe patient for 24 hours:  yes Addl concerns:  None noted

## 2017-05-23 NOTE — Telephone Encounter (Signed)
Pt showed up today 10/1 for her CBC that needed to be repeated due to not enough of sample on lab that was done last week. See previous note from 9/28.

## 2017-05-23 NOTE — Telephone Encounter (Signed)
Pt has been notified of lab results by phone with verbal understanding. 

## 2017-05-24 ENCOUNTER — Ambulatory Visit (HOSPITAL_COMMUNITY)
Admission: RE | Admit: 2017-05-24 | Discharge: 2017-05-24 | Disposition: A | Discharge status: Home / Self Care | Payer: Medicaid Other | Source: Ambulatory Visit | Attending: Cardiology | Admitting: Cardiology

## 2017-05-24 ENCOUNTER — Encounter (HOSPITAL_COMMUNITY)
Admission: RE | Disposition: A | Discharge status: Home / Self Care | Payer: Self-pay | Source: Ambulatory Visit | Attending: Cardiology

## 2017-05-24 ENCOUNTER — Encounter (HOSPITAL_COMMUNITY): Payer: Self-pay | Admitting: Cardiology

## 2017-05-24 DIAGNOSIS — I712 Thoracic aortic aneurysm, without rupture: Secondary | ICD-10-CM | POA: Diagnosis not present

## 2017-05-24 DIAGNOSIS — I7121 Aneurysm of the ascending aorta, without rupture: Secondary | ICD-10-CM

## 2017-05-24 DIAGNOSIS — F329 Major depressive disorder, single episode, unspecified: Secondary | ICD-10-CM | POA: Insufficient documentation

## 2017-05-24 DIAGNOSIS — Z79899 Other long term (current) drug therapy: Secondary | ICD-10-CM | POA: Diagnosis not present

## 2017-05-24 DIAGNOSIS — R079 Chest pain, unspecified: Secondary | ICD-10-CM

## 2017-05-24 DIAGNOSIS — I2510 Atherosclerotic heart disease of native coronary artery without angina pectoris: Secondary | ICD-10-CM | POA: Insufficient documentation

## 2017-05-24 HISTORY — PX: LEFT HEART CATH AND CORONARY ANGIOGRAPHY: CATH118249

## 2017-05-24 LAB — PREGNANCY, URINE: Preg Test, Ur: NEGATIVE

## 2017-05-24 SURGERY — LEFT HEART CATH AND CORONARY ANGIOGRAPHY
Anesthesia: LOCAL

## 2017-05-24 MED ORDER — FENTANYL CITRATE (PF) 100 MCG/2ML IJ SOLN
INTRAMUSCULAR | Status: AC
  Filled 2017-05-24: qty 2

## 2017-05-24 MED ORDER — SODIUM CHLORIDE 0.9 % IV SOLN: 250.0000 mL | INTRAVENOUS | Status: DC | PRN

## 2017-05-24 MED ORDER — HEPARIN SODIUM (PORCINE) 1000 UNIT/ML IJ SOLN
INTRAMUSCULAR | Status: AC
  Filled 2017-05-24: qty 2

## 2017-05-24 MED ORDER — IOPAMIDOL (ISOVUE-370) INJECTION 76%
INTRAVENOUS | Status: AC
  Filled 2017-05-24: qty 100

## 2017-05-24 MED ORDER — IOPAMIDOL (ISOVUE-370) INJECTION 76%
INTRAVENOUS | Status: DC | PRN
  Administered 2017-05-24: 85 mL via INTRA_ARTERIAL

## 2017-05-24 MED ORDER — SODIUM CHLORIDE 0.9 % IV SOLN: INTRAVENOUS | Status: DC

## 2017-05-24 MED ORDER — LIDOCAINE HCL (PF) 1 % IJ SOLN
INTRAMUSCULAR | Status: DC | PRN
  Administered 2017-05-24: 2 mL via SUBCUTANEOUS

## 2017-05-24 MED ORDER — MIDAZOLAM HCL 2 MG/2ML IJ SOLN
INTRAMUSCULAR | Status: AC
  Filled 2017-05-24: qty 2

## 2017-05-24 MED ORDER — HEPARIN (PORCINE) IN NACL 2-0.9 UNIT/ML-% IJ SOLN
INTRAMUSCULAR | Status: AC | PRN
  Administered 2017-05-24: 1000 mL

## 2017-05-24 MED ORDER — FENTANYL CITRATE (PF) 100 MCG/2ML IJ SOLN
INTRAMUSCULAR | Status: DC | PRN
Start: 2017-05-24 — End: 2017-05-24
  Administered 2017-05-24 (×2): 25 ug via INTRAVENOUS

## 2017-05-24 MED ORDER — SODIUM CHLORIDE 0.9 % WEIGHT BASED INFUSION: 1.0000 mL/kg/h | INTRAVENOUS | Status: DC

## 2017-05-24 MED ORDER — ASPIRIN 81 MG PO CHEW: 81.0000 mg | CHEWABLE_TABLET | ORAL | Status: DC

## 2017-05-24 MED ORDER — VERAPAMIL HCL 2.5 MG/ML IV SOLN
INTRAVENOUS | Status: DC | PRN
  Administered 2017-05-24: 10 mL via INTRA_ARTERIAL

## 2017-05-24 MED ORDER — ASPIRIN 81 MG PO CHEW
CHEWABLE_TABLET | ORAL | Status: AC
  Filled 2017-05-24: qty 1

## 2017-05-24 MED ORDER — ACETAMINOPHEN 325 MG PO TABS: 650.0000 mg | ORAL_TABLET | ORAL | Status: DC | PRN

## 2017-05-24 MED ORDER — ONDANSETRON HCL 4 MG/2ML IJ SOLN: 4.0000 mg | Freq: Four times a day (QID) | INTRAMUSCULAR | Status: DC | PRN

## 2017-05-24 MED ORDER — SODIUM CHLORIDE 0.9 % WEIGHT BASED INFUSION
3.0000 mL/kg/h | INTRAVENOUS | Status: AC
  Administered 2017-05-24: 250 mL via INTRAVENOUS

## 2017-05-24 MED ORDER — SODIUM CHLORIDE 0.9% FLUSH: 3.0000 mL | Freq: Two times a day (BID) | INTRAVENOUS | Status: DC

## 2017-05-24 MED ORDER — HEPARIN SODIUM (PORCINE) 1000 UNIT/ML IJ SOLN
INTRAMUSCULAR | Status: DC | PRN
  Administered 2017-05-24: 3500 [IU] via INTRAVENOUS

## 2017-05-24 MED ORDER — HEPARIN (PORCINE) IN NACL 2-0.9 UNIT/ML-% IJ SOLN
INTRAMUSCULAR | Status: AC
  Filled 2017-05-24: qty 1000

## 2017-05-24 MED ORDER — SODIUM CHLORIDE 0.9% FLUSH: 3.0000 mL | INTRAVENOUS | Status: DC | PRN

## 2017-05-24 MED ORDER — MIDAZOLAM HCL 2 MG/2ML IJ SOLN
INTRAMUSCULAR | Status: DC | PRN
  Administered 2017-05-24: 1 mg via INTRAVENOUS
  Administered 2017-05-24: 2 mg via INTRAVENOUS

## 2017-05-24 MED ORDER — VERAPAMIL HCL 2.5 MG/ML IV SOLN
INTRAVENOUS | Status: AC
  Filled 2017-05-24: qty 2

## 2017-05-24 SURGICAL SUPPLY — 13 items
CATH INFINITI 5FR ANG PIGTAIL (CATHETERS) ×2 IMPLANT
CATH OPTITORQUE TIG 4.0 5F (CATHETERS) ×2 IMPLANT
COVER PRB 48X5XTLSCP FOLD TPE (BAG) ×1 IMPLANT
COVER PROBE 5X48 (BAG) ×1
DEVICE RAD COMP TR BAND LRG (VASCULAR PRODUCTS) ×2 IMPLANT
GLIDESHEATH SLEND A-KIT 6F 22G (SHEATH) ×2 IMPLANT
GUIDEWIRE INQWIRE 1.5J.035X260 (WIRE) ×1 IMPLANT
INQWIRE 1.5J .035X260CM (WIRE) ×2
KIT HEART LEFT (KITS) ×2 IMPLANT
PACK CARDIAC CATHETERIZATION (CUSTOM PROCEDURE TRAY) ×2 IMPLANT
SYR MEDRAD MARK V 150ML (SYRINGE) ×2 IMPLANT
TRANSDUCER W/STOPCOCK (MISCELLANEOUS) ×2 IMPLANT
TUBING CIL FLEX 10 FLL-RA (TUBING) ×2 IMPLANT

## 2017-05-24 NOTE — Interval H&P Note (Signed)
History and Physical Interval Note:  05/24/2017 10:15 AM  Veronica Knight  has presented today for surgery, with the diagnosis of cp- dilated ascending aorta aneurysm.  The various methods of treatment have been discussed with the patient and family. After consideration of risks, benefits and other options for treatment, the patient has consented to  Procedure(s): LEFT HEART CATH AND CORONARY ANGIOGRAPHY (N/A) as a surgical intervention .  The patient's history has been reviewed, patient examined, no change in status, stable for surgery.  I have reviewed the patient's chart and labs.  Questions were answered to the patient's satisfaction.     Bryan Lemma
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
## 2017-05-24 NOTE — Discharge Instructions (Signed)
Radial Site Care °Refer to this sheet in the next few weeks. These instructions provide you with information about caring for yourself after your procedure. Your health care provider may also give you more specific instructions. Your treatment has been planned according to current medical practices, but problems sometimes occur. Call your health care provider if you have any problems or questions after your procedure. °What can I expect after the procedure? °After your procedure, it is typical to have the following: °· Bruising at the radial site that usually fades within 1-2 weeks. °· Blood collecting in the tissue (hematoma) that may be painful to the touch. It should usually decrease in size and tenderness within 1-2 weeks. ° °Follow these instructions at home: °· Take medicines only as directed by your health care provider. °· You may shower 24-48 hours after the procedure or as directed by your health care provider. Remove the bandage (dressing) and gently wash the site with plain soap and water. Pat the area dry with a clean towel. Do not rub the site, because this may cause bleeding. °· Do not take baths, swim, or use a hot tub until your health care provider approves. °· Check your insertion site every day for redness, swelling, or drainage. °· Do not apply powder or lotion to the site. °· Do not flex or bend the affected arm for 24 hours or as directed by your health care provider. °· Do not push or pull heavy objects with the affected arm for 24 hours or as directed by your health care provider. °· Do not lift over 10 lb (4.5 kg) for 5 days after your procedure or as directed by your health care provider. °· Ask your health care provider when it is okay to: °? Return to work or school. °? Resume usual physical activities or sports. °? Resume sexual activity. °· Do not drive home if you are discharged the same day as the procedure. Have someone else drive you. °· You may drive 24 hours after the procedure  unless otherwise instructed by your health care provider. °· Do not operate machinery or power tools for 24 hours after the procedure. °· If your procedure was done as an outpatient procedure, which means that you went home the same day as your procedure, a responsible adult should be with you for the first 24 hours after you arrive home. °· Keep all follow-up visits as directed by your health care provider. This is important. °Contact a health care provider if: °· You have a fever. °· You have chills. °· You have increased bleeding from the radial site. Hold pressure on the site. °Get help right away if: °· You have unusual pain at the radial site. °· You have redness, warmth, or swelling at the radial site. °· You have drainage (other than a small amount of blood on the dressing) from the radial site. °· The radial site is bleeding, and the bleeding does not stop after 30 minutes of holding steady pressure on the site. °· Your arm or hand becomes pale, cool, tingly, or numb. °This information is not intended to replace advice given to you by your health care provider. Make sure you discuss any questions you have with your health care provider. °Document Released: 09/11/2010 Document Revised: 01/15/2016 Document Reviewed: 02/25/2014 °Elsevier Interactive Patient Education © 2018 [employer] ° ° ° °Moderate Conscious Sedation, Adult, Care After °These instructions provide you with information about caring for yourself after your procedure. Your health care provider   may also give you more specific instructions. Your treatment has been planned according to current medical practices, but problems sometimes occur. Call your health care provider if you have any problems or questions after your procedure. °What can I expect after the procedure? °After your procedure, it is common: °· To feel sleepy for several hours. °· To feel clumsy and have poor balance for several hours. °· To have poor judgment for several  hours. °· To vomit if you eat too soon. ° °Follow these instructions at home: °For at least 24 hours after the procedure: ° °· Do not: °? Participate in activities where you could fall or become injured. °? Drive. °? Use heavy machinery. °? Drink alcohol. °? Take sleeping pills or medicines that cause drowsiness. °? Make important decisions or sign legal documents. °? Take care of children on your own. °· Rest. °Eating and drinking °· Follow the diet recommended by your health care provider. °· If you vomit: °? Drink water, juice, or soup when you can drink without vomiting. °? Make sure you have little or no nausea before eating solid foods. °General instructions °· Have a responsible adult stay with you until you are awake and alert. °· Take over-the-counter and prescription medicines only as told by your health care provider. °· If you smoke, do not smoke without supervision. °· Keep all follow-up visits as told by your health care provider. This is important. °Contact a health care provider if: °· You keep feeling nauseous or you keep vomiting. °· You feel light-headed. °· You develop a rash. °· You have a fever. °Get help right away if: °· You have trouble breathing. °This information is not intended to replace advice given to you by your health care provider. Make sure you discuss any questions you have with your health care provider. °Document Released: 05/30/2013 Document Revised: 01/12/2016 Document Reviewed: 11/29/2015 °Elsevier Interactive Patient Education © 2018 [employer]vier Inc[employer] ° °

## 2017-05-27 ENCOUNTER — Ambulatory Visit: Payer: Self-pay | Admitting: Physician Assistant

## 2017-06-02 ENCOUNTER — Encounter (HOSPITAL_COMMUNITY): Payer: Self-pay | Admitting: Emergency Medicine

## 2017-06-02 ENCOUNTER — Other Ambulatory Visit: Payer: Self-pay

## 2017-06-02 ENCOUNTER — Emergency Department (HOSPITAL_COMMUNITY)
Admission: EM | Admit: 2017-06-02 | Discharge: 2017-06-03 | Disposition: A | Discharge status: Home / Self Care | Payer: Medicaid Other | Attending: Emergency Medicine | Admitting: Emergency Medicine

## 2017-06-02 DIAGNOSIS — Z8679 Personal history of other diseases of the circulatory system: Secondary | ICD-10-CM | POA: Insufficient documentation

## 2017-06-02 DIAGNOSIS — Z7982 Long term (current) use of aspirin: Secondary | ICD-10-CM | POA: Diagnosis not present

## 2017-06-02 DIAGNOSIS — R079 Chest pain, unspecified: Secondary | ICD-10-CM

## 2017-06-02 DIAGNOSIS — R0789 Other chest pain: Secondary | ICD-10-CM | POA: Insufficient documentation

## 2017-06-02 DIAGNOSIS — Z79899 Other long term (current) drug therapy: Secondary | ICD-10-CM | POA: Diagnosis not present

## 2017-06-02 LAB — COMPREHENSIVE METABOLIC PANEL
ALT: 14 U/L (ref 14–54)
ANION GAP: 7 (ref 5–15)
AST: 16 U/L (ref 15–41)
Albumin: 4.1 g/dL (ref 3.5–5.0)
Alkaline Phosphatase: 47 U/L (ref 38–126)
BUN: 10 mg/dL (ref 6–20)
CHLORIDE: 105 mmol/L (ref 101–111)
CO2: 25 mmol/L (ref 22–32)
Calcium: 9.3 mg/dL (ref 8.9–10.3)
Creatinine, Ser: 0.7 mg/dL (ref 0.44–1.00)
Glucose, Bld: 84 mg/dL (ref 65–99)
POTASSIUM: 4.3 mmol/L (ref 3.5–5.1)
SODIUM: 137 mmol/L (ref 135–145)
Total Bilirubin: 0.4 mg/dL (ref 0.3–1.2)
Total Protein: 7.1 g/dL (ref 6.5–8.1)

## 2017-06-02 LAB — CBC WITH DIFFERENTIAL/PLATELET
Basophils Absolute: 0 10*3/uL (ref 0.0–0.1)
Basophils Relative: 0 %
EOS ABS: 0.1 10*3/uL (ref 0.0–0.7)
EOS PCT: 1 %
HCT: 40.1 % (ref 36.0–46.0)
Hemoglobin: 13.1 g/dL (ref 12.0–15.0)
LYMPHS ABS: 3.5 10*3/uL (ref 0.7–4.0)
LYMPHS PCT: 58 %
MCH: 29.5 pg (ref 26.0–34.0)
MCHC: 32.7 g/dL (ref 30.0–36.0)
MCV: 90.3 fL (ref 78.0–100.0)
MONO ABS: 0.6 10*3/uL (ref 0.1–1.0)
Monocytes Relative: 9 %
Neutro Abs: 1.9 10*3/uL (ref 1.7–7.7)
Neutrophils Relative %: 32 %
PLATELETS: 265 10*3/uL (ref 150–400)
RBC: 4.44 MIL/uL (ref 3.87–5.11)
RDW: 12.9 % (ref 11.5–15.5)
WBC: 6.1 10*3/uL (ref 4.0–10.5)

## 2017-06-02 LAB — I-STAT TROPONIN, ED: TROPONIN I, POC: 0 ng/mL (ref 0.00–0.08)

## 2017-06-02 MED ORDER — IOPAMIDOL (ISOVUE-370) INJECTION 76%
INTRAVENOUS | Status: AC
  Filled 2017-06-02: qty 100

## 2017-06-02 MED ORDER — FENTANYL CITRATE (PF) 100 MCG/2ML IJ SOLN
50.0000 ug | Freq: Once | INTRAMUSCULAR | Status: AC
  Administered 2017-06-02: 50 ug via INTRAVENOUS
  Filled 2017-06-02: qty 2

## 2017-06-02 NOTE — ED Triage Notes (Addendum)
Pt c/o left chest pain describes as sharp Pt st's she has a aneurysm  in her chest    Nausea without vomiting.  Onset of pain yesterday  Pt also st's she has a small bump in the roof of her mouth

## 2017-06-03 ENCOUNTER — Emergency Department (HOSPITAL_COMMUNITY): Payer: Medicaid Other

## 2017-06-03 ENCOUNTER — Encounter (HOSPITAL_COMMUNITY): Payer: Self-pay | Admitting: Radiology

## 2017-06-03 MED ORDER — IOPAMIDOL (ISOVUE-370) INJECTION 76%
100.0000 mL | Freq: Once | INTRAVENOUS | Status: AC | PRN
  Administered 2017-06-03: 100 mL via INTRAVENOUS

## 2017-06-03 MED ORDER — TRAMADOL HCL 50 MG PO TABS: 50.0000 mg | ORAL_TABLET | Freq: Four times a day (QID) | ORAL | 0 refills | Status: DC | PRN

## 2017-06-03 NOTE — Discharge Instructions (Signed)
Your CT chest is showing no worsening of the 4.8 cm aortic aneurysm. Follow up with as scheduled with Dr. Tyrone Sage for further management. A resource list of primary care providers is provided for your reference. Take Ultram as directed for pain if needed.
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
## 2017-06-03 NOTE — ED Notes (Signed)
 Fentanyl WIS w/ Verdene Lennert., RN.

## 2017-06-06 ENCOUNTER — Ambulatory Visit (INDEPENDENT_AMBULATORY_CARE_PROVIDER_SITE_OTHER): Payer: Medicaid Other | Admitting: Physician Assistant

## 2017-06-06 ENCOUNTER — Encounter: Payer: Self-pay | Admitting: Physician Assistant

## 2017-06-06 VITALS — BP 98/60 | HR 67 | Ht 68.0 in | Wt 155.0 lb

## 2017-06-06 DIAGNOSIS — M542 Cervicalgia: Secondary | ICD-10-CM

## 2017-06-06 DIAGNOSIS — R0782 Intercostal pain: Secondary | ICD-10-CM

## 2017-06-06 DIAGNOSIS — I7121 Aneurysm of the ascending aorta, without rupture: Secondary | ICD-10-CM

## 2017-06-06 DIAGNOSIS — I712 Thoracic aortic aneurysm, without rupture: Secondary | ICD-10-CM | POA: Diagnosis not present
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
## 2017-06-06 NOTE — Patient Instructions (Signed)
Medication Instructions:  No changes.  I took Aspirin off your medication list since you are not taking this.  Labwork: None   Testing/Procedures: None   Follow-Up: Dr. Delane Ginger in 6 months. Make sure you follow up with Dr. Tyrone Sage in 6 months. I will send in a referral to orthopedics to evaluate your neck and chest pain.  Call Medicaid to see how you can get established with a primary care doctor.   Any Other Special Instructions Will Be Listed Below (If Applicable).  If you need a refill on your cardiac medications before your next appointment, please call your pharmacy.

## 2017-06-16 ENCOUNTER — Ambulatory Visit: Payer: Medicaid Other | Admitting: Genetic Counselor
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
## 2017-09-07 NOTE — Progress Notes (Signed)
Patient approached leadership to state (and display) that she had picked up medical records for her personal information but needed a copy of her after visit summary from this particular visit. I made her aware that Dr. Sunnie Nielsenegalado was not working at [facility] on [facility] today.

## 2017-09-12 ENCOUNTER — Telehealth: Payer: Self-pay | Admitting: Physician Assistant
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
## 2017-09-13 ENCOUNTER — Telehealth: Payer: Self-pay | Admitting: Physician Assistant

## 2017-09-13 ENCOUNTER — Encounter: Payer: Self-pay | Admitting: Physician Assistant
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
## 2017-09-13 NOTE — Telephone Encounter (Signed)
Spoke to patient and let her know her letter was ready to be picked up and would be left at the front desk for her to pick up when she was ready. Patient verbalized understanding as to where to pick up letter.

## 2017-09-13 NOTE — Telephone Encounter (Signed)
Done. Tereso NewcomerScott Weaver, PA-C    09/13/2017 12:39 PM

## 2017-10-04 ENCOUNTER — Other Ambulatory Visit: Payer: Self-pay | Admitting: *Deleted

## 2017-10-04 NOTE — Progress Notes (Unsigned)
ct 

## 2017-11-07 ENCOUNTER — Ambulatory Visit: Payer: Medicaid Other | Admitting: Obstetrics & Gynecology

## 2017-11-07 ENCOUNTER — Other Ambulatory Visit (HOSPITAL_COMMUNITY)
Admission: RE | Admit: 2017-11-07 | Discharge: 2017-11-07 | Disposition: A | Discharge status: Home / Self Care | Payer: Medicaid Other | Source: Ambulatory Visit | Attending: Obstetrics & Gynecology | Admitting: Obstetrics & Gynecology

## 2017-11-07 ENCOUNTER — Encounter: Payer: Self-pay | Admitting: Obstetrics & Gynecology

## 2017-11-07 VITALS — BP 114/76 | HR 94 | Ht 67.5 in | Wt 165.6 lb

## 2017-11-07 DIAGNOSIS — Z0000 Encounter for general adult medical examination without abnormal findings: Secondary | ICD-10-CM | POA: Diagnosis not present

## 2017-11-07 DIAGNOSIS — Z01419 Encounter for gynecological examination (general) (routine) without abnormal findings: Secondary | ICD-10-CM | POA: Insufficient documentation

## 2017-11-07 NOTE — Progress Notes (Signed)
Pt is in the office for annual, pt requests std testing. Pt had aneurysm and had heart surgery in Dec 2018.
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
## 2017-11-09 LAB — CYTOLOGY - PAP
Chlamydia: NEGATIVE
Diagnosis: NEGATIVE
NEISSERIA GONORRHEA: NEGATIVE

## 2017-11-15 ENCOUNTER — Encounter (HOSPITAL_COMMUNITY): Payer: Self-pay | Admitting: Family Medicine

## 2017-11-15 DIAGNOSIS — N898 Other specified noninflammatory disorders of vagina: Secondary | ICD-10-CM | POA: Diagnosis present

## 2017-11-15 DIAGNOSIS — B9689 Other specified bacterial agents as the cause of diseases classified elsewhere: Secondary | ICD-10-CM | POA: Insufficient documentation

## 2017-11-15 DIAGNOSIS — Z7982 Long term (current) use of aspirin: Secondary | ICD-10-CM | POA: Insufficient documentation

## 2017-11-15 DIAGNOSIS — N760 Acute vaginitis: Secondary | ICD-10-CM | POA: Diagnosis not present

## 2017-11-15 DIAGNOSIS — Z87891 Personal history of nicotine dependence: Secondary | ICD-10-CM | POA: Diagnosis not present

## 2017-11-15 NOTE — ED Triage Notes (Signed)
Patient reports she thinks she has a yeast infection due to taking Amoxacillin before a dental procedure. Patient reports she has thick, white discharge. Also, reports she has a vaginal odor.

## 2017-11-16 ENCOUNTER — Emergency Department (HOSPITAL_COMMUNITY)
Admission: EM | Admit: 2017-11-16 | Discharge: 2017-11-16 | Disposition: A | Discharge status: Home / Self Care | Payer: Medicaid Other | Attending: Emergency Medicine | Admitting: Emergency Medicine

## 2017-11-16 DIAGNOSIS — N760 Acute vaginitis: Secondary | ICD-10-CM

## 2017-11-16 DIAGNOSIS — B9689 Other specified bacterial agents as the cause of diseases classified elsewhere: Secondary | ICD-10-CM

## 2017-11-16 LAB — URINALYSIS, ROUTINE W REFLEX MICROSCOPIC
Bilirubin Urine: NEGATIVE
Glucose, UA: NEGATIVE mg/dL
Hgb urine dipstick: NEGATIVE
KETONES UR: 20 mg/dL — AB
Nitrite: NEGATIVE
PROTEIN: NEGATIVE mg/dL
Specific Gravity, Urine: 1.023 (ref 1.005–1.030)
pH: 5 (ref 5.0–8.0)

## 2017-11-16 LAB — PREGNANCY, URINE: Preg Test, Ur: NEGATIVE

## 2017-11-16 LAB — WET PREP, GENITAL
Sperm: NONE SEEN
Trich, Wet Prep: NONE SEEN
YEAST WET PREP: NONE SEEN

## 2017-11-16 LAB — GC/CHLAMYDIA PROBE AMP (~~LOC~~) NOT AT ARMC
Chlamydia: NEGATIVE
Neisseria Gonorrhea: NEGATIVE

## 2017-11-16 MED ORDER — METRONIDAZOLE 500 MG PO TABS: 500.0000 mg | ORAL_TABLET | Freq: Two times a day (BID) | ORAL | 0 refills | Status: DC
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
## 2017-11-17 ENCOUNTER — Ambulatory Visit: Payer: Self-pay | Admitting: Cardiothoracic Surgery
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
## 2018-03-24 ENCOUNTER — Emergency Department (HOSPITAL_COMMUNITY): Payer: Medicaid Other

## 2018-03-24 ENCOUNTER — Encounter (HOSPITAL_COMMUNITY): Payer: Self-pay | Admitting: *Deleted

## 2018-03-24 ENCOUNTER — Emergency Department (HOSPITAL_COMMUNITY)
Admission: EM | Admit: 2018-03-24 | Discharge: 2018-03-24 | Disposition: A | Discharge status: Home / Self Care | Payer: Medicaid Other | Attending: Emergency Medicine | Admitting: Emergency Medicine

## 2018-03-24 DIAGNOSIS — Z87891 Personal history of nicotine dependence: Secondary | ICD-10-CM | POA: Diagnosis not present

## 2018-03-24 DIAGNOSIS — R1110 Vomiting, unspecified: Secondary | ICD-10-CM | POA: Insufficient documentation

## 2018-03-24 DIAGNOSIS — R0789 Other chest pain: Secondary | ICD-10-CM | POA: Insufficient documentation

## 2018-03-24 DIAGNOSIS — Z7982 Long term (current) use of aspirin: Secondary | ICD-10-CM | POA: Insufficient documentation

## 2018-03-24 DIAGNOSIS — M25512 Pain in left shoulder: Secondary | ICD-10-CM | POA: Diagnosis not present

## 2018-03-24 LAB — CBC
HCT: 41.8 % (ref 36.0–46.0)
Hemoglobin: 13.3 g/dL (ref 12.0–15.0)
MCH: 27.2 pg (ref 26.0–34.0)
MCHC: 31.8 g/dL (ref 30.0–36.0)
MCV: 85.5 fL (ref 78.0–100.0)
Platelets: 303 10*3/uL (ref 150–400)
RBC: 4.89 MIL/uL (ref 3.87–5.11)
RDW: 14.6 % (ref 11.5–15.5)
WBC: 4.3 10*3/uL (ref 4.0–10.5)

## 2018-03-24 LAB — BASIC METABOLIC PANEL
ANION GAP: 13 (ref 5–15)
BUN: 8 mg/dL (ref 6–20)
CALCIUM: 9 mg/dL (ref 8.9–10.3)
CO2: 25 mmol/L (ref 22–32)
Chloride: 102 mmol/L (ref 98–111)
Creatinine, Ser: 0.71 mg/dL (ref 0.44–1.00)
GFR calc Af Amer: >60 mL/min (ref >60)
Glucose, Bld: 92 mg/dL (ref 70–99)
POTASSIUM: 3.7 mmol/L (ref 3.5–5.1)
SODIUM: 140 mmol/L (ref 135–145)

## 2018-03-24 LAB — I-STAT BETA HCG BLOOD, ED (MC, WL, AP ONLY)

## 2018-03-24 LAB — I-STAT TROPONIN, ED
TROPONIN I, POC: 0 ng/mL (ref 0.00–0.08)
Troponin i, poc: 0 ng/mL (ref 0.00–0.08)

## 2018-03-24 MED ORDER — IOPAMIDOL (ISOVUE-370) INJECTION 76%
INTRAVENOUS | Status: AC
  Filled 2018-03-24: qty 100

## 2018-03-24 MED ORDER — IOPAMIDOL (ISOVUE-370) INJECTION 76%
100.0000 mL | Freq: Once | INTRAVENOUS | Status: AC | PRN
  Administered 2018-03-24: 100 mL via INTRAVENOUS

## 2018-03-24 MED ORDER — FENTANYL CITRATE (PF) 100 MCG/2ML IJ SOLN: 50.0000 ug | Freq: Once | INTRAMUSCULAR | Status: DC

## 2018-03-24 NOTE — ED Notes (Signed)
Patient transported to CT 

## 2018-03-24 NOTE — ED Triage Notes (Signed)
Pt in c/o left sided chest pain radiating into her shoulder, shoulder pain is worse with movement, pt has history of aortic aneurism with repair in December

## 2018-03-24 NOTE — ED Notes (Signed)
Pt ambulatory to and from RR w/ steady gait 

## 2018-03-24 NOTE — ED Notes (Addendum)
Pt states she got really hot and vomited; had a very sharp chest pain directly after; no denies CP at this time, but c/o continued L shoulder pain; pt took 1 ASA around 1000
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
## 2018-03-24 NOTE — Discharge Instructions (Signed)
Return to ED for worsening symptoms, coughing or vomiting of blood, severe chest pain or shortness of breath, lightheadedness or loss of consciousness.

## 2018-03-24 NOTE — ED Notes (Signed)
Pt states that her chest and shoulder pain is coming back; central and left chest, sharp pain, 4/10, provider notified

## 2018-11-10 ENCOUNTER — Ambulatory Visit: Payer: Medicaid Other | Admitting: Obstetrics and Gynecology

## 2019-04-12 ENCOUNTER — Encounter: Payer: Self-pay | Admitting: Certified Nurse Midwife

## 2019-04-12 ENCOUNTER — Ambulatory Visit: Payer: Medicaid Other | Admitting: Certified Nurse Midwife

## 2019-04-12 ENCOUNTER — Other Ambulatory Visit: Payer: Self-pay

## 2019-04-12 ENCOUNTER — Other Ambulatory Visit (HOSPITAL_COMMUNITY)
Admission: RE | Admit: 2019-04-12 | Discharge: 2019-04-12 | Disposition: A | Discharge status: Home / Self Care | Payer: Medicaid Other | Source: Ambulatory Visit | Attending: Obstetrics and Gynecology | Admitting: Obstetrics and Gynecology

## 2019-04-12 VITALS — BP 109/77 | HR 94 | Ht 68.0 in | Wt 208.2 lb

## 2019-04-12 DIAGNOSIS — Z113 Encounter for screening for infections with a predominantly sexual mode of transmission: Secondary | ICD-10-CM | POA: Insufficient documentation

## 2019-04-12 DIAGNOSIS — Z0000 Encounter for general adult medical examination without abnormal findings: Secondary | ICD-10-CM | POA: Diagnosis not present

## 2019-04-12 DIAGNOSIS — Z01419 Encounter for gynecological examination (general) (routine) without abnormal findings: Secondary | ICD-10-CM

## 2019-04-12 NOTE — Progress Notes (Signed)
Pt is in the office for annual, last pap 11-07-17.Pt states that she is not sexually active and has regular cycles. LMP 03-23-19. Pt desires full std testing today.
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
## 2019-04-13 LAB — HIV ANTIBODY (ROUTINE TESTING W REFLEX): HIV Screen 4th Generation wRfx: NONREACTIVE

## 2019-04-13 LAB — RPR: RPR Ser Ql: NONREACTIVE

## 2019-04-13 LAB — HEPATITIS B SURFACE ANTIGEN: Hepatitis B Surface Ag: NEGATIVE

## 2019-04-13 LAB — HEPATITIS C ANTIBODY: Hep C Virus Ab: <0.1 s/co ratio (ref 0.0–0.9)

## 2019-04-14 LAB — CERVICOVAGINAL ANCILLARY ONLY
Bacterial vaginitis: POSITIVE — AB
Candida vaginitis: NEGATIVE
Chlamydia: NEGATIVE
Neisseria Gonorrhea: NEGATIVE
Trichomonas: NEGATIVE

## 2019-04-19 ENCOUNTER — Other Ambulatory Visit: Payer: Self-pay | Admitting: *Deleted

## 2019-04-19 DIAGNOSIS — N760 Acute vaginitis: Secondary | ICD-10-CM

## 2019-04-19 DIAGNOSIS — B9689 Other specified bacterial agents as the cause of diseases classified elsewhere: Secondary | ICD-10-CM

## 2019-04-19 MED ORDER — METRONIDAZOLE 500 MG PO TABS: 500.0000 mg | ORAL_TABLET | Freq: Two times a day (BID) | ORAL | 0 refills | Status: DC

## 2019-04-19 MED ORDER — FLUCONAZOLE 150 MG PO TABS: 150.0000 mg | ORAL_TABLET | Freq: Once | ORAL | 0 refills | Status: AC

## 2019-04-19 NOTE — Progress Notes (Signed)
Pt called to office stating she had positive results in mychart.  Pt is +BV, request tx be sent in to pharmacy.  Pt states she gets yeast with antibiotic use.  Diflucan sent in today to use after tx.

## 2019-06-25 ENCOUNTER — Ambulatory Visit: Payer: Medicaid Other | Admitting: Obstetrics and Gynecology

## 2019-06-25 ENCOUNTER — Other Ambulatory Visit: Payer: Self-pay

## 2019-06-25 ENCOUNTER — Encounter: Payer: Self-pay | Admitting: Obstetrics and Gynecology

## 2019-06-25 ENCOUNTER — Other Ambulatory Visit (HOSPITAL_COMMUNITY)
Admission: RE | Admit: 2019-06-25 | Discharge: 2019-06-25 | Disposition: A | Discharge status: Home / Self Care | Payer: Medicaid Other | Source: Ambulatory Visit | Attending: Obstetrics and Gynecology | Admitting: Obstetrics and Gynecology

## 2019-06-25 VITALS — BP 126/83 | HR 89 | Ht 68.0 in | Wt 210.6 lb

## 2019-06-25 DIAGNOSIS — N938 Other specified abnormal uterine and vaginal bleeding: Secondary | ICD-10-CM | POA: Diagnosis not present

## 2019-06-25 NOTE — Progress Notes (Signed)
Pt presents for abnormal cycles. She states that her cycles has been abnormal for the past 2-3 months. She states that she has been having really bad pain and spotting. She states that the pain is unbearable at times.
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
## 2019-06-26 LAB — CERVICOVAGINAL ANCILLARY ONLY
Bacterial Vaginitis (gardnerella): POSITIVE — AB
Candida Glabrata: NEGATIVE
Candida Vaginitis: NEGATIVE
Comment: NEGATIVE
Comment: NEGATIVE
Comment: NEGATIVE

## 2019-06-27 ENCOUNTER — Telehealth: Payer: Self-pay

## 2019-06-27 MED ORDER — METRONIDAZOLE 500 MG PO TABS: 500.0000 mg | ORAL_TABLET | Freq: Two times a day (BID) | ORAL | 0 refills | Status: DC

## 2019-06-27 NOTE — Telephone Encounter (Signed)
  Returned call to patient, message sent to Provider regarding repeated use of Antibiotics

## 2019-06-27 NOTE — Addendum Note (Signed)
Addended by: Mora Bellman on: 06/27/2019 08:31 AM   Modules accepted: Orders

## 2019-06-28 ENCOUNTER — Telehealth: Payer: Self-pay

## 2019-06-28 NOTE — Telephone Encounter (Signed)
-----   Message from Mora Bellman, MD sent at 06/28/2019  9:18 AM EST ----- Regarding: RE: BV Contact: [phone] If the symptoms are not bothersome, she doesn't have to take the flagyl and wait for her vaginal pH to re-equilibrate itself. That can take several months though. My only concern is that her vaginal spotting before her periods, could be coming from this BV infection. I would recommend treating it to see if the spotting resolves. Take the measures we discussed in the office to decrease her risk of recurrence such as monitoring her diet, laundry detergent and body soap (both should be unscented), etc..  Peggy ----- Message ----- From: Tamela Oddi, RMA Sent: 06/27/2019   4:49 PM EST To: Mora Bellman, MD Subject: BV                                             Hi Dr. Elly Modena, Patient left message c/o recurrent BV, she stated that she has been treat x3-4 with Antibiotics and she is concerned about taking more Ax.  Please advised.

## 2019-06-28 NOTE — Telephone Encounter (Signed)
  Patient notified of Provider's response, MyChart message also sent.  She verbalized understanding.

## 2019-07-03 ENCOUNTER — Ambulatory Visit (HOSPITAL_COMMUNITY)
Admission: RE | Admit: 2019-07-03 | Discharge: 2019-07-03 | Disposition: A | Discharge status: Home / Self Care | Payer: Medicaid Other | Source: Ambulatory Visit | Attending: Obstetrics and Gynecology | Admitting: Obstetrics and Gynecology

## 2019-07-03 ENCOUNTER — Other Ambulatory Visit: Payer: Self-pay

## 2019-07-03 DIAGNOSIS — N938 Other specified abnormal uterine and vaginal bleeding: Secondary | ICD-10-CM | POA: Diagnosis not present

## 2019-07-05 ENCOUNTER — Other Ambulatory Visit: Payer: Self-pay | Admitting: Obstetrics and Gynecology

## 2019-07-05 DIAGNOSIS — N83202 Unspecified ovarian cyst, left side: Secondary | ICD-10-CM

## 2019-07-30 ENCOUNTER — Telehealth: Payer: Self-pay

## 2019-07-30 NOTE — Telephone Encounter (Signed)
Pt called and reports that she is having recurrent BV infections and vaginal spotting. She would also like to be tested for STD's. I advised pt to be scheduled for provider visit due to the recurrent BV. Pt verbalized understanding, appt made.

## 2019-08-02 ENCOUNTER — Ambulatory Visit: Payer: Medicaid Other | Admitting: Certified Nurse Midwife

## 2019-08-15 ENCOUNTER — Other Ambulatory Visit: Payer: Self-pay

## 2019-08-15 ENCOUNTER — Ambulatory Visit (HOSPITAL_COMMUNITY)
Admission: EM | Admit: 2019-08-15 | Discharge: 2019-08-15 | Disposition: A | Discharge status: Home / Self Care | Payer: Medicaid Other | Attending: Family Medicine | Admitting: Family Medicine

## 2019-08-15 ENCOUNTER — Encounter (HOSPITAL_COMMUNITY): Payer: Self-pay

## 2019-08-15 DIAGNOSIS — R8290 Unspecified abnormal findings in urine: Secondary | ICD-10-CM | POA: Insufficient documentation

## 2019-08-15 DIAGNOSIS — N898 Other specified noninflammatory disorders of vagina: Secondary | ICD-10-CM | POA: Insufficient documentation

## 2019-08-15 LAB — POCT URINALYSIS DIP (DEVICE)
Bilirubin Urine: NEGATIVE
Glucose, UA: NEGATIVE mg/dL
Hgb urine dipstick: NEGATIVE
Ketones, ur: NEGATIVE mg/dL
Nitrite: NEGATIVE
Protein, ur: NEGATIVE mg/dL
Specific Gravity, Urine: 1.025 (ref 1.005–1.030)
Urobilinogen, UA: 1 mg/dL (ref 0.0–1.0)
pH: 7 (ref 5.0–8.0)

## 2019-08-15 MED ORDER — FLUCONAZOLE 150 MG PO TABS: ORAL_TABLET | ORAL | 0 refills | Status: DC

## 2019-08-15 MED ORDER — METRONIDAZOLE 500 MG PO TABS: 500.0000 mg | ORAL_TABLET | Freq: Two times a day (BID) | ORAL | 0 refills | Status: DC

## 2019-08-15 NOTE — ED Triage Notes (Signed)
Patient presents to Urgent Care with complaints of possible recurrent BV. Patient reports she has been trying probiotic vaginal suppositories, developed lower back pain today and is now concerned she may also have a UTI.
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
## 2019-08-17 LAB — URINE CULTURE: Culture: <10000 — AB

## 2019-08-21 LAB — CERVICOVAGINAL ANCILLARY ONLY
Bacterial vaginitis: POSITIVE — AB
Candida vaginitis: NEGATIVE
Trichomonas: NEGATIVE

## 2019-10-04 ENCOUNTER — Other Ambulatory Visit: Payer: Self-pay

## 2019-10-04 ENCOUNTER — Ambulatory Visit (HOSPITAL_COMMUNITY)
Admission: RE | Admit: 2019-10-04 | Discharge: 2019-10-04 | Disposition: A | Discharge status: Home / Self Care | Payer: Medicaid Other | Source: Ambulatory Visit | Attending: Obstetrics and Gynecology | Admitting: Obstetrics and Gynecology

## 2019-10-04 DIAGNOSIS — N83202 Unspecified ovarian cyst, left side: Secondary | ICD-10-CM | POA: Diagnosis present

## 2019-10-06 ENCOUNTER — Other Ambulatory Visit: Payer: Self-pay | Admitting: Obstetrics and Gynecology

## 2019-10-06 DIAGNOSIS — N83201 Unspecified ovarian cyst, right side: Secondary | ICD-10-CM

## 2019-11-15 ENCOUNTER — Ambulatory Visit (HOSPITAL_COMMUNITY): Payer: Medicaid Other

## 2019-11-26 ENCOUNTER — Ambulatory Visit (HOSPITAL_COMMUNITY): Admission: RE | Admit: 2019-11-26 | Payer: Medicaid Other | Source: Ambulatory Visit

## 2019-12-07 ENCOUNTER — Other Ambulatory Visit: Payer: Self-pay

## 2019-12-07 ENCOUNTER — Emergency Department (HOSPITAL_COMMUNITY)
Admission: EM | Admit: 2019-12-07 | Discharge: 2019-12-08 | Disposition: A | Discharge status: Home / Self Care | Payer: Medicaid Other | Attending: Emergency Medicine | Admitting: Emergency Medicine

## 2019-12-07 ENCOUNTER — Emergency Department (HOSPITAL_COMMUNITY): Payer: Medicaid Other

## 2019-12-07 ENCOUNTER — Encounter (HOSPITAL_COMMUNITY): Payer: Self-pay

## 2019-12-07 DIAGNOSIS — M25511 Pain in right shoulder: Secondary | ICD-10-CM | POA: Diagnosis not present

## 2019-12-07 DIAGNOSIS — R0602 Shortness of breath: Secondary | ICD-10-CM | POA: Diagnosis not present

## 2019-12-07 DIAGNOSIS — R0789 Other chest pain: Secondary | ICD-10-CM

## 2019-12-07 DIAGNOSIS — Z87891 Personal history of nicotine dependence: Secondary | ICD-10-CM | POA: Diagnosis not present

## 2019-12-07 LAB — BASIC METABOLIC PANEL
Anion gap: 10 (ref 5–15)
BUN: 10 mg/dL (ref 6–20)
CO2: 21 mmol/L — ABNORMAL LOW (ref 22–32)
Calcium: 8.9 mg/dL (ref 8.9–10.3)
Chloride: 106 mmol/L (ref 98–111)
Creatinine, Ser: 0.68 mg/dL (ref 0.44–1.00)
GFR calc Af Amer: >60 mL/min (ref >60)
GFR calc non Af Amer: >60 mL/min (ref >60)
Glucose, Bld: 86 mg/dL (ref 70–99)
Potassium: 3.9 mmol/L (ref 3.5–5.1)
Sodium: 137 mmol/L (ref 135–145)

## 2019-12-07 LAB — CBC
HCT: 46.8 % — ABNORMAL HIGH (ref 36.0–46.0)
Hemoglobin: 14.8 g/dL (ref 12.0–15.0)
MCH: 28.9 pg (ref 26.0–34.0)
MCHC: 31.6 g/dL (ref 30.0–36.0)
MCV: 91.4 fL (ref 80.0–100.0)
Platelets: 286 10*3/uL (ref 150–400)
RBC: 5.12 MIL/uL — ABNORMAL HIGH (ref 3.87–5.11)
RDW: 14.5 % (ref 11.5–15.5)
WBC: 4 10*3/uL (ref 4.0–10.5)
nRBC: 0 % (ref 0.0–0.2)

## 2019-12-07 LAB — I-STAT BETA HCG BLOOD, ED (MC, WL, AP ONLY): I-stat hCG, quantitative: <5 m[IU]/mL (ref <5)

## 2019-12-07 LAB — TROPONIN I (HIGH SENSITIVITY): Troponin I (High Sensitivity): <2 ng/L (ref <18)

## 2019-12-07 MED ORDER — SODIUM CHLORIDE 0.9% FLUSH: 3.0000 mL | Freq: Once | INTRAVENOUS | Status: DC

## 2019-12-07 NOTE — ED Triage Notes (Signed)
Pt arrives to ED w/ c/o chest and left shoulder pain. Pt has hx of heart surgery for aneurysm on aorta in 2018. Pt denies SOB. Pt rates pain 7/10

## 2019-12-08 ENCOUNTER — Emergency Department (HOSPITAL_COMMUNITY): Payer: Medicaid Other

## 2019-12-08 LAB — TROPONIN I (HIGH SENSITIVITY): Troponin I (High Sensitivity): <2 ng/L (ref <18)

## 2019-12-08 MED ORDER — KETOROLAC TROMETHAMINE 30 MG/ML IJ SOLN
15.0000 mg | Freq: Once | INTRAMUSCULAR | Status: AC
  Administered 2019-12-08: 15 mg via INTRAVENOUS
  Filled 2019-12-08: qty 1

## 2019-12-08 MED ORDER — DIAZEPAM 2 MG PO TABS
2.0000 mg | ORAL_TABLET | Freq: Once | ORAL | Status: AC
  Administered 2019-12-08: 06:00:00 2 mg via ORAL
  Filled 2019-12-08: qty 1

## 2019-12-08 MED ORDER — IBUPROFEN 400 MG PO TABS: 400.0000 mg | ORAL_TABLET | Freq: Four times a day (QID) | ORAL | 0 refills | Status: DC

## 2019-12-08 MED ORDER — DIAZEPAM 5 MG PO TABS: 2.5000 mg | ORAL_TABLET | Freq: Four times a day (QID) | ORAL | 0 refills | Status: DC | PRN

## 2019-12-08 MED ORDER — IOHEXOL 350 MG/ML SOLN
75.0000 mL | Freq: Once | INTRAVENOUS | Status: AC | PRN
  Administered 2019-12-08: 07:00:00 75 mL via INTRAVENOUS

## 2019-12-08 NOTE — ED Notes (Signed)
Upon vital check, pt was noted to be in respiratory symptoms area, I advised pt that she should move to a different area due to this area being a respiratory symptom area.

## 2019-12-08 NOTE — Discharge Instructions (Addendum)
Follow-up with primary doctor if symptoms or not improving in the next week, and return to the ER if symptoms significantly worsen or change.
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
## 2020-03-16 ENCOUNTER — Encounter (HOSPITAL_COMMUNITY): Payer: Self-pay

## 2020-03-16 ENCOUNTER — Ambulatory Visit (HOSPITAL_COMMUNITY)
Admission: EM | Admit: 2020-03-16 | Discharge: 2020-03-16 | Disposition: A | Discharge status: Home / Self Care | Payer: Medicaid Other | Attending: Emergency Medicine | Admitting: Emergency Medicine

## 2020-03-16 DIAGNOSIS — N760 Acute vaginitis: Secondary | ICD-10-CM | POA: Diagnosis not present

## 2020-03-16 DIAGNOSIS — B9689 Other specified bacterial agents as the cause of diseases classified elsewhere: Secondary | ICD-10-CM

## 2020-03-16 MED ORDER — METRONIDAZOLE 500 MG PO TABS: 500.0000 mg | ORAL_TABLET | Freq: Two times a day (BID) | ORAL | 0 refills | Status: AC

## 2020-03-16 MED ORDER — FLUCONAZOLE 150 MG PO TABS: 150.0000 mg | ORAL_TABLET | Freq: Once | ORAL | 0 refills | Status: AC

## 2020-03-16 NOTE — Discharge Instructions (Signed)
Flagyl twice daily x 1 week, no alcohol until 24 hours after last tablet Diflucan for yeast if needed Follow up if not improving

## 2020-03-16 NOTE — ED Triage Notes (Signed)
Pt present vaginal discharge, symptoms started over two weeks ago. Pt states the vaginal discharge is clear with an odor.
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
## 2020-11-06 ENCOUNTER — Ambulatory Visit (INDEPENDENT_AMBULATORY_CARE_PROVIDER_SITE_OTHER): Payer: Medicaid Other

## 2020-11-06 ENCOUNTER — Other Ambulatory Visit: Payer: Self-pay

## 2020-11-06 ENCOUNTER — Other Ambulatory Visit (HOSPITAL_COMMUNITY)
Admission: RE | Admit: 2020-11-06 | Discharge: 2020-11-06 | Disposition: A | Discharge status: Home / Self Care | Payer: Medicaid Other | Source: Ambulatory Visit

## 2020-11-06 VITALS — BP 122/80 | HR 89 | Ht 68.0 in | Wt 223.0 lb

## 2020-11-06 DIAGNOSIS — Z01419 Encounter for gynecological examination (general) (routine) without abnormal findings: Secondary | ICD-10-CM | POA: Diagnosis present

## 2020-11-06 DIAGNOSIS — Z1239 Encounter for other screening for malignant neoplasm of breast: Secondary | ICD-10-CM

## 2020-11-06 DIAGNOSIS — Z124 Encounter for screening for malignant neoplasm of cervix: Secondary | ICD-10-CM | POA: Diagnosis not present

## 2020-11-06 DIAGNOSIS — N644 Mastodynia: Secondary | ICD-10-CM

## 2020-11-06 DIAGNOSIS — Z113 Encounter for screening for infections with a predominantly sexual mode of transmission: Secondary | ICD-10-CM

## 2020-11-06 NOTE — Progress Notes (Signed)
Patient presents for Annual Exam.  LMP:10/29/20 usually lasting 5-7 days. Heavy flow w/clots. Last Pap: 11/07/2017 WNL  Contraception: None   STD Screening: Desires Full Panel  Family Hx of Breast Cancer: P.Grandmother    CC: None
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
## 2020-11-07 LAB — CERVICOVAGINAL ANCILLARY ONLY
Bacterial Vaginitis (gardnerella): NEGATIVE
Candida Glabrata: NEGATIVE
Candida Vaginitis: NEGATIVE
Chlamydia: NEGATIVE
Comment: NEGATIVE
Comment: NEGATIVE
Comment: NEGATIVE
Comment: NEGATIVE
Comment: NEGATIVE
Comment: NORMAL
Neisseria Gonorrhea: NEGATIVE
Trichomonas: NEGATIVE

## 2020-11-08 NOTE — Patient Instructions (Signed)
Mammogram A mammogram is a low energy X-ray of the breasts that is done to check for abnormal changes. This procedure can screen for and detect any changes that may indicate breast cancer. Mammograms are regularly done on women. A man may have a mammogram if he has a lump or swelling in his breast. A mammogram can also identify other changes and variations in the breast, such as:  Inflammation of the breast tissue (mastitis).  An infected area that contains a collection of pus (abscess).  A fluid-filled sac (cyst).  Fibrocystic changes. This is when breast tissue becomes denser, which can make the tissue feel rope-like or uneven under the skin.  Tumors that are not cancerous (benign). Tell a health care provider:  About any allergies you have.  If you have breast implants.  If you have had previous breast disease, biopsy, or surgery.  If you are breastfeeding.  If you are younger than age 25.  If you have a family history of breast cancer.  Whether you are pregnant or may be pregnant. What are the risks? Generally, this is a safe procedure. However, problems may occur, including:  Exposure to radiation. Radiation levels are very low with this test.  The results being misinterpreted.  The need for further tests.  The inability of the mammogram to detect certain cancers. What happens before the procedure?  Schedule your test about 1-2 weeks after your menstrual period if you are still menstruating. This is usually when your breasts are the least tender.  If you have had a mammogram done at a different facility in the past, get the mammogram X-rays or have them sent to your current exam facility. The new and old images will be compared.  Wash your breasts and underarms on the day of the test.  Do not wear deodorants, perfumes, lotions, or powders anywhere on your body on the day of the test.  Remove any jewelry from your neck.  Wear clothes that you can change into and  out of easily. What happens during the procedure?  You will undress from the waist up and put on a gown that opens in the front.  You will stand in front of the X-ray machine.  Each breast will be placed between two plastic or glass plates. The plates will compress your breast for a few seconds. Try to stay as relaxed as possible during the procedure. This does not cause any harm to your breasts and any discomfort you feel will be very brief.  X-rays will be taken from different angles of each breast. The procedure may vary among health care providers and hospitals.   What happens after the procedure?  The mammogram will be examined by a specialist (radiologist).  You may need to repeat certain parts of the test, depending on the quality of the images. This is commonly done if the radiologist needs a better view of the breast tissue.  You may resume your normal activities.  It is up to you to get the results of your procedure. Ask your health care provider, or the department that is doing the procedure, when your results will be ready. Summary  A mammogram is a low energy X-ray of the breasts that is done to check for abnormal changes. A man may have a mammogram if he has a lump or swelling in his breast.  If you have had a mammogram done at a different facility in the past, get the mammogram X-rays or have them sent to   your current exam facility in order to compare them.  Schedule your test about 1-2 weeks after your menstrual period if you are still menstruating.  For this test, each breast will be placed between two plastic or glass plates. The plates will compress your breast for a few seconds.  Ask when your test results will be ready. Make sure you get your test results. This information is not intended to replace advice given to you by your health care provider. Make sure you discuss any questions you have with your health care provider. Document Revised: 03/30/2018 Document  Reviewed: 03/30/2018 Elsevier Patient Education  2021 [employer].  

## 2020-11-10 ENCOUNTER — Telehealth: Payer: Self-pay

## 2020-11-10 LAB — CYTOLOGY - PAP
Comment: NEGATIVE
Diagnosis: NEGATIVE
High risk HPV: NEGATIVE

## 2020-11-10 NOTE — Telephone Encounter (Signed)
Followed up with patient regarding the breast findings noted during her annual exam. Patient said she is doing ok and she is just waiting to have her mammogram.  Pt was able to see recent swab results on Mychart. Pap still pending.  Informed pt we here for her and to contact the office if she has any questions. Pt agrees

## 2020-11-28 ENCOUNTER — Encounter (HOSPITAL_COMMUNITY): Payer: Self-pay | Admitting: Emergency Medicine

## 2020-11-28 ENCOUNTER — Other Ambulatory Visit: Payer: Self-pay

## 2020-11-28 ENCOUNTER — Ambulatory Visit (HOSPITAL_COMMUNITY)
Admission: EM | Admit: 2020-11-28 | Discharge: 2020-11-28 | Disposition: A | Discharge status: Home / Self Care | Payer: Medicaid Other

## 2020-11-28 DIAGNOSIS — W57XXXA Bitten or stung by nonvenomous insect and other nonvenomous arthropods, initial encounter: Secondary | ICD-10-CM | POA: Diagnosis not present

## 2020-11-28 DIAGNOSIS — S40862A Insect bite (nonvenomous) of left upper arm, initial encounter: Secondary | ICD-10-CM

## 2020-11-28 NOTE — Discharge Instructions (Addendum)
-  Try benedryl for symptomatic relief of insect bite . -Head to ED if you develop new symptoms like shortness of breath, facial swelling, dizziness, chest pain

## 2020-11-28 NOTE — ED Triage Notes (Addendum)
Pt presents with bee sting on left arm. States has never been stung by a bee before. Denies any SOB, facial swelling.
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
## 2020-12-22 ENCOUNTER — Other Ambulatory Visit: Payer: Self-pay

## 2020-12-22 ENCOUNTER — Ambulatory Visit
Admission: RE | Admit: 2020-12-22 | Discharge: 2020-12-22 | Disposition: A | Discharge status: Home / Self Care | Payer: Medicaid Other | Source: Ambulatory Visit

## 2020-12-22 ENCOUNTER — Ambulatory Visit: Payer: Medicaid Other

## 2020-12-22 DIAGNOSIS — N644 Mastodynia: Secondary | ICD-10-CM

## 2021-02-07 ENCOUNTER — Encounter (HOSPITAL_COMMUNITY): Payer: Self-pay

## 2021-02-07 ENCOUNTER — Ambulatory Visit (HOSPITAL_COMMUNITY)
Admission: EM | Admit: 2021-02-07 | Discharge: 2021-02-07 | Disposition: A | Discharge status: Home / Self Care | Payer: Medicaid Other | Attending: Physician Assistant | Admitting: Physician Assistant

## 2021-02-07 DIAGNOSIS — N926 Irregular menstruation, unspecified: Secondary | ICD-10-CM | POA: Diagnosis not present

## 2021-02-07 DIAGNOSIS — Z3201 Encounter for pregnancy test, result positive: Secondary | ICD-10-CM | POA: Diagnosis not present

## 2021-02-07 LAB — POC URINE PREG, ED: Preg Test, Ur: POSITIVE — AB

## 2021-02-07 NOTE — ED Triage Notes (Signed)
Pt presents today for a preg test. LMP 5/2. Pt reports taking Plan B pill on 01/04/21. Confirms light spotting, bloating and abdominal pain. Denies nausea. Positive at home pregnancy test two days ago.
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
## 2021-02-07 NOTE — ED Notes (Signed)
Pt did not want to wait for blood work.

## 2021-02-28 ENCOUNTER — Other Ambulatory Visit: Payer: Self-pay

## 2021-02-28 ENCOUNTER — Ambulatory Visit (HOSPITAL_COMMUNITY)
Admission: EM | Admit: 2021-02-28 | Discharge: 2021-02-28 | Disposition: A | Discharge status: Home / Self Care | Payer: Medicaid Other | Attending: Medical Oncology | Admitting: Medical Oncology

## 2021-02-28 ENCOUNTER — Encounter (HOSPITAL_COMMUNITY): Payer: Self-pay

## 2021-02-28 DIAGNOSIS — H9202 Otalgia, left ear: Secondary | ICD-10-CM

## 2021-02-28 MED ORDER — PREDNISONE 10 MG (21) PO TBPK: ORAL_TABLET | Freq: Every day | ORAL | 0 refills | Status: DC
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
## 2021-02-28 NOTE — ED Triage Notes (Signed)
Pt present left ear pain, symptoms started a week ago with constant shooting pain.  Pt states the pain is unbearable.

## 2021-07-09 ENCOUNTER — Ambulatory Visit (INDEPENDENT_AMBULATORY_CARE_PROVIDER_SITE_OTHER): Payer: 59 | Admitting: Podiatry

## 2021-07-09 ENCOUNTER — Encounter: Payer: Self-pay | Admitting: Podiatry

## 2021-07-09 ENCOUNTER — Ambulatory Visit (INDEPENDENT_AMBULATORY_CARE_PROVIDER_SITE_OTHER): Payer: 59

## 2021-07-09 ENCOUNTER — Other Ambulatory Visit: Payer: Self-pay

## 2021-07-09 DIAGNOSIS — S99911A Unspecified injury of right ankle, initial encounter: Secondary | ICD-10-CM

## 2021-07-09 DIAGNOSIS — T148XXA Other injury of unspecified body region, initial encounter: Secondary | ICD-10-CM

## 2021-07-10 NOTE — Progress Notes (Signed)
Subjective:   Patient ID: Veronica Knight, female   DOB: 30 y.o.   MRN: [id number]   HPI Patient presents stating she injured her foot first 2 weeks ago when she was getting out of the shower and then several days later she felt a pop in the outside of her ankle and it has been swollen.  She states it is slightly better but still very sore and she was just concerned about this.  Patient does not smoke likes to be active if possible   Review of Systems  All other systems reviewed and are negative.      Objective:  Physical Exam Vitals and nursing note reviewed.  Constitutional:      Appearance: She is well-developed.  Pulmonary:     Effort: Pulmonary effort is normal.  Musculoskeletal:        General: Normal range of motion.  Skin:    General: Skin is warm.  Neurological:     Mental Status: She is alert.    Neurovascular status found to be intact muscle strength found to be adequate range of motion within normal limits.  Patient is noted on the lateral side of the right ankle to have mild to moderate swelling mild to moderate discomfort and when I tested the muscle I do not think there is any dysfunction it appears to be strong but difficult to make complete determination as she is splinting to a degree.  Patient does have significant flatfoot deformity bilateral good digital perfusion well oriented x3     Assessment:  Probability for strain of the peroneal tendon right cannot rule out the chances for any kind of a tear     Plan:  H&P reviewed condition and x-ray.  At this point organ to go ahead and immobilize with air fracture walker utilize ice and see how this responds over the next 3 to 4 weeks.  If it remains tender or if I feel like there is muscle dysfunction we will get an MRI at that time but I do not think this is torn and I do believe that it is strained and should respond to immobilization  X-rays indicate significant flatfoot deformity bilateral that she has had  her whole life no indications of coalition or arthritis     Patient

## 2021-08-06 ENCOUNTER — Ambulatory Visit: Payer: 59 | Admitting: Podiatry

## 2021-10-12 ENCOUNTER — Encounter (HOSPITAL_COMMUNITY): Payer: Self-pay

## 2021-10-12 ENCOUNTER — Ambulatory Visit (HOSPITAL_COMMUNITY)
Admission: EM | Admit: 2021-10-12 | Discharge: 2021-10-12 | Disposition: A | Discharge status: Home / Self Care | Payer: Medicaid Other | Attending: Physician Assistant | Admitting: Physician Assistant

## 2021-10-12 ENCOUNTER — Other Ambulatory Visit: Payer: Self-pay

## 2021-10-12 DIAGNOSIS — J329 Chronic sinusitis, unspecified: Secondary | ICD-10-CM

## 2021-10-12 DIAGNOSIS — J40 Bronchitis, not specified as acute or chronic: Secondary | ICD-10-CM

## 2021-10-12 LAB — POC INFLUENZA A AND B ANTIGEN (URGENT CARE ONLY)
INFLUENZA A ANTIGEN, POC: NEGATIVE
INFLUENZA B ANTIGEN, POC: NEGATIVE

## 2021-10-12 MED ORDER — FLUCONAZOLE 150 MG PO TABS: 150.0000 mg | ORAL_TABLET | Freq: Once | ORAL | 0 refills | Status: AC

## 2021-10-12 MED ORDER — ALBUTEROL SULFATE HFA 108 (90 BASE) MCG/ACT IN AERS
INHALATION_SPRAY | RESPIRATORY_TRACT | Status: AC
  Filled 2021-10-12: qty 6.7

## 2021-10-12 MED ORDER — AMOXICILLIN-POT CLAVULANATE 875-125 MG PO TABS: 1.0000 | ORAL_TABLET | Freq: Two times a day (BID) | ORAL | 0 refills | Status: AC

## 2021-10-12 MED ORDER — ALBUTEROL SULFATE HFA 108 (90 BASE) MCG/ACT IN AERS
2.0000 | INHALATION_SPRAY | Freq: Once | RESPIRATORY_TRACT | Status: AC
  Administered 2021-10-12: 2 via RESPIRATORY_TRACT

## 2021-10-12 NOTE — ED Notes (Signed)
Flu swab in lab 
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
## 2021-10-12 NOTE — Discharge Instructions (Addendum)
Your flu test is negative.  I am concerned for secondary bacterial infection such as sinobronchitis.  Please start Augmentin twice daily.  If you develop a yeast infection take Diflucan.  Alternate Tylenol and ibuprofen for pain.  Use Mucinex and Flonase to help with congestion.  Make sure you rest and drink plenty of fluid.  If your symptoms or not improving by next week please return or see your PCP.  If anything worsens and you develop high fever, chest pain, shortness of breath, nausea/vomiting interfering with oral intake, sudden severe headache, dizziness you need to go to the emergency room.

## 2021-10-12 NOTE — ED Triage Notes (Signed)
Pt reports headaches, sore throat, runny nose and cough x 1 week. States she was exposed to COVID. States her sxs have worsened.

## 2021-11-19 ENCOUNTER — Ambulatory Visit: Payer: Medicaid Other | Admitting: Obstetrics and Gynecology

## 2022-06-06 ENCOUNTER — Other Ambulatory Visit: Payer: Self-pay

## 2022-06-06 ENCOUNTER — Encounter (HOSPITAL_COMMUNITY): Payer: Self-pay | Admitting: *Deleted

## 2022-06-06 ENCOUNTER — Ambulatory Visit (HOSPITAL_COMMUNITY)
Admission: EM | Admit: 2022-06-06 | Discharge: 2022-06-06 | Disposition: A | Discharge status: Home / Self Care | Payer: Medicaid Other | Attending: Internal Medicine | Admitting: Internal Medicine

## 2022-06-06 DIAGNOSIS — J069 Acute upper respiratory infection, unspecified: Secondary | ICD-10-CM | POA: Diagnosis present

## 2022-06-06 DIAGNOSIS — J029 Acute pharyngitis, unspecified: Secondary | ICD-10-CM | POA: Insufficient documentation

## 2022-06-06 LAB — POCT RAPID STREP A, ED / UC: Streptococcus, Group A Screen (Direct): NEGATIVE

## 2022-06-06 MED ORDER — FLUTICASONE PROPIONATE 50 MCG/ACT NA SUSP: 2.0000 | Freq: Every day | NASAL | 2 refills | Status: AC

## 2022-06-06 MED ORDER — LORATADINE 10 MG PO TABS: 10.0000 mg | ORAL_TABLET | Freq: Every day | ORAL | 0 refills | Status: AC
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
## 2022-06-06 NOTE — Discharge Instructions (Addendum)
Advised to take Claritin 10 mg once a day as this will help decrease the drainage tobacco throat. Advised to use the Flonase nasal spray 2 sprays each nostril once a day as this will also help decrease drainage from the back of her throat. Advised take ibuprofen or Tylenol for pain or discomfort and continue to use lozenges as needed. Advised to follow-up with PCP or return to urgent care if symptoms fail to improve.

## 2022-06-06 NOTE — ED Triage Notes (Signed)
Pt has had a sore throat for 2 weeks. Pt has tried OTC with out relief. Pt requesting a Strep test because her son had strep recently had strep.

## 2022-06-08 LAB — CULTURE, GROUP A STREP (THRC)

## 2022-11-03 NOTE — Telephone Encounter (Signed)
done

## 2022-11-16 ENCOUNTER — Other Ambulatory Visit: Payer: Self-pay | Admitting: Obstetrics & Gynecology

## 2022-11-16 DIAGNOSIS — N920 Excessive and frequent menstruation with regular cycle: Secondary | ICD-10-CM

## 2022-12-01 ENCOUNTER — Ambulatory Visit
Admission: RE | Admit: 2022-12-01 | Discharge: 2022-12-01 | Disposition: A | Discharge status: Home / Self Care | Payer: Medicaid Other | Source: Ambulatory Visit | Attending: Obstetrics & Gynecology | Admitting: Obstetrics & Gynecology

## 2022-12-01 DIAGNOSIS — N920 Excessive and frequent menstruation with regular cycle: Secondary | ICD-10-CM

## 2022-12-21 ENCOUNTER — Ambulatory Visit: Payer: Medicaid Other | Admitting: Certified Nurse Midwife

## 2023-04-26 ENCOUNTER — Ambulatory Visit (INDEPENDENT_AMBULATORY_CARE_PROVIDER_SITE_OTHER): Payer: Self-pay

## 2023-04-26 ENCOUNTER — Encounter (HOSPITAL_COMMUNITY): Payer: Self-pay

## 2023-04-26 ENCOUNTER — Ambulatory Visit (HOSPITAL_COMMUNITY)
Admission: EM | Admit: 2023-04-26 | Discharge: 2023-04-26 | Disposition: A | Discharge status: Home / Self Care | Payer: Self-pay | Attending: Family Medicine | Admitting: Family Medicine

## 2023-04-26 DIAGNOSIS — M79662 Pain in left lower leg: Secondary | ICD-10-CM

## 2023-04-26 MED ORDER — IBUPROFEN 800 MG PO TABS: 800.0000 mg | ORAL_TABLET | Freq: Three times a day (TID) | ORAL | 0 refills | Status: AC
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
## 2023-04-26 NOTE — ED Triage Notes (Signed)
Patient states a marble table fell on her left calf. Patient has bruising present.  Patient states she took a [employer]ayer Aspirin (back and muscle) x 2 at 1415.
